# Patient Record
Sex: Male | Born: 1940
Health system: Southern US, Community
[De-identification: ages and names within clinical notes are randomized; demographics above are authoritative.]

## PROBLEM LIST (undated history)

## (undated) ENCOUNTER — Emergency Department (HOSPITAL_COMMUNITY): Payer: Medicare Other | Source: Home / Self Care

## (undated) DIAGNOSIS — R001 Bradycardia, unspecified: Secondary | ICD-10-CM

## (undated) DIAGNOSIS — I2510 Atherosclerotic heart disease of native coronary artery without angina pectoris: Secondary | ICD-10-CM

## (undated) DIAGNOSIS — I10 Essential (primary) hypertension: Secondary | ICD-10-CM

## (undated) DIAGNOSIS — E785 Hyperlipidemia, unspecified: Secondary | ICD-10-CM

## (undated) DIAGNOSIS — I455 Other specified heart block: Secondary | ICD-10-CM

## (undated) DIAGNOSIS — H269 Unspecified cataract: Secondary | ICD-10-CM

## (undated) DIAGNOSIS — Z720 Tobacco use: Secondary | ICD-10-CM

## (undated) HISTORY — DX: Unspecified cataract: H26.9

## (undated) HISTORY — PX: OTHER SURGICAL HISTORY: SHX169

## (undated) HISTORY — PX: APPENDECTOMY: SHX54

## (undated) HISTORY — DX: Essential (primary) hypertension: I10

## (undated) HISTORY — PX: DENTAL SURGERY: SHX609

---

## 2006-08-28 ENCOUNTER — Emergency Department (HOSPITAL_COMMUNITY): Admission: EM | Admit: 2006-08-28 | Discharge: 2006-08-28 | Payer: Self-pay | Admitting: Emergency Medicine

## 2009-01-14 ENCOUNTER — Emergency Department (HOSPITAL_COMMUNITY): Admission: EM | Admit: 2009-01-14 | Discharge: 2009-01-15 | Payer: Self-pay | Admitting: Emergency Medicine

## 2009-01-15 ENCOUNTER — Emergency Department (HOSPITAL_COMMUNITY): Admission: EM | Admit: 2009-01-15 | Discharge: 2009-01-15 | Payer: Self-pay | Admitting: Emergency Medicine

## 2009-01-17 ENCOUNTER — Inpatient Hospital Stay (HOSPITAL_COMMUNITY): Admission: EM | Admit: 2009-01-17 | Discharge: 2009-01-22 | Payer: Self-pay | Admitting: Oral Surgery

## 2009-01-17 ENCOUNTER — Other Ambulatory Visit: Payer: Self-pay | Admitting: Oral Surgery

## 2009-03-12 ENCOUNTER — Encounter: Admission: RE | Admit: 2009-03-12 | Discharge: 2009-03-12 | Payer: Self-pay | Admitting: Family Medicine

## 2010-05-10 ENCOUNTER — Encounter
Admission: RE | Admit: 2010-05-10 | Discharge: 2010-05-10 | Payer: Self-pay | Source: Home / Self Care | Attending: Family Medicine | Admitting: Family Medicine

## 2010-08-23 LAB — URINALYSIS, ROUTINE W REFLEX MICROSCOPIC
Glucose, UA: NEGATIVE mg/dL
Hgb urine dipstick: NEGATIVE
Ketones, ur: 15 mg/dL — AB
Specific Gravity, Urine: 1.026 (ref 1.005–1.030)
Urobilinogen, UA: 1 mg/dL (ref 0.0–1.0)
pH: 5.5 (ref 5.0–8.0)

## 2010-08-23 LAB — CBC
HCT: 36.4 % — ABNORMAL LOW (ref 39.0–52.0)
MCV: 89.3 fL (ref 78.0–100.0)
Platelets: 278 10*3/uL (ref 150–400)
Platelets: 300 10*3/uL (ref 150–400)
RBC: 4.03 MIL/uL — ABNORMAL LOW (ref 4.22–5.81)
RDW: 12.8 % (ref 11.5–15.5)
RDW: 13.6 % (ref 11.5–15.5)
WBC: 7.2 10*3/uL (ref 4.0–10.5)

## 2010-08-23 LAB — COMPREHENSIVE METABOLIC PANEL
Albumin: 3.2 g/dL — ABNORMAL LOW (ref 3.5–5.2)
BUN: 10 mg/dL (ref 6–23)
CO2: 25 mEq/L (ref 19–32)
Calcium: 8.9 mg/dL (ref 8.4–10.5)
Chloride: 98 mEq/L (ref 96–112)
GFR calc Af Amer: >60 mL/min (ref >60)
Glucose, Bld: 127 mg/dL — ABNORMAL HIGH (ref 70–99)

## 2010-08-23 LAB — URINE MICROSCOPIC-ADD ON

## 2010-08-23 LAB — LIPID PANEL
Cholesterol: 133 mg/dL (ref 0–200)
HDL: 47 mg/dL (ref >39)
Total CHOL/HDL Ratio: 2.8 RATIO
Triglycerides: 56 mg/dL (ref <150)
VLDL: 11 mg/dL (ref 0–40)

## 2010-10-01 NOTE — Consult Note (Signed)
Todd Ferguson, Todd Ferguson             ACCOUNT NO.:  [id number]   MEDICAL RECORD NO.:  [id number]          PATIENT TYPE:  INP   LOCATION:  5120                         FACILITY:  [facility]   PHYSICIAN:  Conley Canal, MD      DATE OF BIRTH:  09-16-1940   DATE OF CONSULTATION:  01/17/2009  DATE OF DISCHARGE:                                 CONSULTATION   REASON FOR CONSULTATION:  Management of hypertension.   I was asked by Dr. Barbette Merino to see this pleasant 70 year old African  American male who professes no previous past medical history, who was  brought in for left mandibular abscess drainage performed today by Dr.  Barbette Merino.  The consult was requested because the patient was noted to have  elevated blood pressure ranging from 148 to 184 systolic during this  hospitalization, hence reason for consult.  The patient states that he  has not had any problems with blood pressure although he does not follow  with any family care Todd Ferguson and he says that he has otherwise been in  good health, denies any symptomatology related to hypertension.  There  is no history of headaches, no chest pain, no genitourinary symptoms.  He states that he does not check his blood pressure at home and it has  been a while since he last saw a primary care Todd Ferguson.   REVIEW OF SYSTEMS:  Unremarkable except as highlighted in the history of  present illness.   PAST MEDICAL HISTORY:  Negative.   PAST SURGICAL HISTORY:  History of appendectomy.  History of left  mandibular abscess drainage.   ALLERGIES:  No known drug allergies.   FAMILY HISTORY:  Strong family history of hypertension including both  his parents.  There is also a history of heart disease in his mother who  is diabetic.   SOCIAL HISTORY:  The patient works in a Producer, television/film/video.  He has a  fiancee.  He smokes half a pack of cigars per day he says for the last  10 years.  There is no history of alcohol, no illicit drugs.   HOME MEDICATIONS:  None.   INPATIENT MEDICATIONSN:  1. D5W NS  2. Clindamycin.  3. Percocet.  4. Zofran as needed.   PHYSICAL EXAMINATION:  GENERAL:  This is an elderly [place] American  male who is otherwise not in acute distress.  He is sitting up in bed.  VITAL SIGNS:  Blood pressure 184/72, heart rate 104, temperature 102.2,  respiratory rate 16.  HEENT:  Pupils equal reacting to light.  He has an obvious left  mandibular swelling with no drainage.  RESPIRATORY:  There is good air entry bilaterally with no rhonchi or  wheezes.  CARDIOVASCULAR:  First and second heart sounds, no murmurs, pulses.  Regular, tachycardic.  ABDOMEN:  Scaphoid, soft, nontender, no palpable organomegaly.  Bowel  sounds are normal.  CNS:  The patient is alert, oriented to person, place and time with no  acute focal neurological deficits.  EXTREMITIES:  There is no pedal edema.  Peripheral pulses are equal.   LABORATORY DATA:  Significant for WBC 12,000,  hemoglobin 13.8,  hematocrit 40.8, platelet count 278.  Sodium 135, potassium 3.8, BUN 10,  creatinine 0.96, glucose 127.  Liver function tests within normal  limits.   IMPRESSION:  A 70 year old [place] American male who is presenting with  newly diagnosed hypertension obviously poorly controlled, who is  admitted for left mandibular drainage which has been successfully  performed today.   RECOMMENDATIONS:  1. Uncontrolled hypertension.  Given patient's strong family history      and his infrequent visits to primary care providers, he probably      has had hypertension undiagnosed for quite a while now although he      has been essentially asymptomatic consistent with essential      hypertension.  There might be an element of pain contributing to      the hypertension.  At this stage would recommend to start an      antihypertensive and monitor the blood pressure during the course      of the hospitalization.  We will start the patient on calcium      channel blocker and  obtain baseline labs including urinalysis, EKG.      Will also obtain thyroid function tests, lipids panel.  The patient      was encouraged to follow with primary care for health care      maintenance including especially his high blood pressure.  2. Left mandibular abscess.  Being managed by Dr. Barbette Merino.  Would      continue with current management including gentle rehydration and      IV antibiotics.  3. Tobacco  habituation.  Strong smoking cessation counseling was      given including complications of prolonged cigar smoking.  The      patient will look into quitting.  4. Deep venous thrombosis prophylaxis.  5. Hyperglycemia.  This might be related to stress.  Would monitor      glucose during course of hospitalization.  The patient may have      undiagnosed glucose intolerance.   Thank you for involving [place] in the care of this patient.  Will follow  with you during the course of this hospitalization.      Conley Canal, MD  Electronically Signed     SR/MEDQ  D:  01/17/2009  T:  01/18/2009  Job:  [id number]

## 2010-10-01 NOTE — H&P (Signed)
NAMELEO, FRAY             ACCOUNT NO.:  [id number]   MEDICAL RECORD NO.:  [id number]          PATIENT TYPE:  INP   LOCATION:  5120                         FACILITY:  [facility]   PHYSICIAN:  Georgia Lopes, M.D.  DATE OF BIRTH:  [birth year]   DATE OF ADMISSION:  01/17/2009  DATE OF DISCHARGE:                              HISTORY & PHYSICAL   ADMITTING DIAGNOSIS:  Left mandibular cellulitis.   CHIEF COMPLAINT:  Swelling of left jaw.   HISTORY OF PRESENT ILLNESS:  Mr. Forehand is a 70 year old black male who  was seen at [facility] Emergency Room on August 30 for complaint of  swelling of the left jaw.  He was evaluated and given prescriptions for  penicillin and Percocet and referred to my office for evaluation.  He  was seen the following day and was scheduled for surgery on January 18, 2009.  The patient called on January 16, 2009, complaining of increased  swelling.  He was seen in the office on that date and scheduled for  surgery today.  Using intravenous sedation and local anesthesia,  incision and drainage was performed both extraorally and intraorally and  Penrose drains were placed in the left mandible.  Teeth numbers #17 and  #18 were removed.  Purulence was obtained.  The patient tolerated the  procedure well, but postoperatively began having chills and did not feel  like himself and presented to the emergency room at [facility].   ALLERGIES:  None.   PAST MEDICAL HISTORY:  The patient denies; however, the patient was seen  in [facility] ER on August 30th complaining of high blood pressure and was  told to see a primary care physician.   MEDICATIONS:  Penicillin, Percocet.   PHYSICAL EXAMINATION:  GENERAL:  A well-nourished, well-developed 70-  year-old black male with obvious left facial swelling and dressing in  place.  VITALS:  Temperature of 102.2, BP 184/72, pulse 104, respirations 24.  Upon admission after intravenous fluids and pain medication, BP  decreased to 142/74.  HEENT:  Head normocephalic, atraumatic.  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  Extraocular motions intact.  Recent extraction  area of tooth #17 and #18 Penrose drain present, buccal vestibule  adjacent to #17 there is some ecchymosis and edema of the left soft  palate, but no midline shift.  Maximum opening is below normal at  approximately 20 mm.  NECK:  Two Penrose drains dressed on the left submandibular area.  HEART:  Regular rate and rhythm.  LUNGS:  Clear.  ABDOMEN:  Positive bowel sounds.  Soft, nontender.  EXTREMITIES:  Without cyanosis.  Full range of motion present.  NEUROLOGIC:  Alert and oriented x3.  Cranial nerves intact.   IMPRESSION:  A 70 year old black male with left mandibular cellulitis  status post incision and drainage and removal of teeth and worked up  hypertension.   PLAN:  Admit.  Intravenous antibiotics and fluids.  Dr. Venetia Constable of  Hospitalist Service is to see the patient for hypertension.      Georgia Lopes, M.D.  Electronically Signed  SMJ/MEDQ  D:  01/17/2009  T:  01/18/2009  Job:  [id number]

## 2011-06-03 DIAGNOSIS — N429 Disorder of prostate, unspecified: Secondary | ICD-10-CM | POA: Diagnosis not present

## 2011-06-03 DIAGNOSIS — E785 Hyperlipidemia, unspecified: Secondary | ICD-10-CM | POA: Diagnosis not present

## 2011-06-03 DIAGNOSIS — I10 Essential (primary) hypertension: Secondary | ICD-10-CM | POA: Diagnosis not present

## 2011-07-30 DIAGNOSIS — I10 Essential (primary) hypertension: Secondary | ICD-10-CM | POA: Diagnosis not present

## 2011-07-30 DIAGNOSIS — E785 Hyperlipidemia, unspecified: Secondary | ICD-10-CM | POA: Diagnosis not present

## 2013-04-29 ENCOUNTER — Ambulatory Visit: Payer: Medicare Other | Attending: Internal Medicine | Admitting: Internal Medicine

## 2013-04-29 ENCOUNTER — Telehealth: Payer: Self-pay | Admitting: Internal Medicine

## 2013-04-29 ENCOUNTER — Encounter: Payer: Self-pay | Admitting: Internal Medicine

## 2013-04-29 VITALS — BP 201/98 | HR 71 | Temp 98.8°F | Resp 14 | Ht 64.0 in | Wt 205.6 lb

## 2013-04-29 DIAGNOSIS — I10 Essential (primary) hypertension: Secondary | ICD-10-CM | POA: Diagnosis not present

## 2013-04-29 DIAGNOSIS — Z23 Encounter for immunization: Secondary | ICD-10-CM | POA: Diagnosis not present

## 2013-04-29 LAB — COMPLETE METABOLIC PANEL WITH GFR
ALT: 11 U/L (ref 0–53)
AST: 13 U/L (ref 0–37)
Albumin: 4.4 g/dL (ref 3.5–5.2)
Alkaline Phosphatase: 52 U/L (ref 39–117)
CO2: 28 mEq/L (ref 19–32)
Calcium: 9.6 mg/dL (ref 8.4–10.5)
Chloride: 101 mEq/L (ref 96–112)
Creat: 0.99 mg/dL (ref 0.50–1.35)
Glucose, Bld: 83 mg/dL (ref 70–99)
Total Bilirubin: 0.3 mg/dL (ref 0.3–1.2)

## 2013-04-29 LAB — LIPID PANEL
Cholesterol: 203 mg/dL — ABNORMAL HIGH (ref 0–200)
HDL: 55 mg/dL (ref >39)
Total CHOL/HDL Ratio: 3.7 Ratio

## 2013-04-29 LAB — CBC WITH DIFFERENTIAL/PLATELET
Basophils Relative: 0 % (ref 0–1)
Eosinophils Relative: 3 % (ref 0–5)
HCT: 45.1 % (ref 39.0–52.0)
Lymphs Abs: 2.8 10*3/uL (ref 0.7–4.0)
Monocytes Relative: 7 % (ref 3–12)
Neutrophils Relative %: 43 % (ref 43–77)
Platelets: 259 10*3/uL (ref 150–400)
WBC: 5.9 10*3/uL (ref 4.0–10.5)

## 2013-04-29 MED ORDER — HYDROCHLOROTHIAZIDE 25 MG PO TABS: 25.0000 mg | ORAL_TABLET | Freq: Every day | ORAL | Status: DC

## 2013-04-29 MED ORDER — LISINOPRIL 40 MG PO TABS: 40.0000 mg | ORAL_TABLET | Freq: Every day | ORAL | Status: DC

## 2013-04-29 MED ORDER — AMLODIPINE BESY-BENAZEPRIL HCL 10-20 MG PO CAPS: 1.0000 | ORAL_CAPSULE | Freq: Every day | ORAL | Status: DC

## 2013-04-29 MED ORDER — CLONIDINE HCL 0.1 MG PO TABS: 0.2000 mg | ORAL_TABLET | Freq: Once | ORAL | Status: DC

## 2013-04-29 NOTE — Progress Notes (Signed)
Pt is here to establish care and for hypertension.

## 2013-04-29 NOTE — Telephone Encounter (Signed)
[facility] pharmacy calling to verify quantity and directions for cloNIDine (CATAPRES) 0.1 MG tablet script e-scribed on 04/29/13. Please f/u with pharmacy.

## 2013-04-29 NOTE — Progress Notes (Signed)
Patient ID: Todd Ferguson, male   DOB: 11/27/1940, 72 y.o.   MRN: [id number]   CC:  HPI:  72 year old African American male with a past medical history of hypertension, who presents today to establish care, her systolic blood pressure is 201. He denies any chest pain any shortness of breath. He denies any dependent edema He is on multiple medications for his blood pressure has not taken them for about 6 months. He states that his previous primary care provider was unavailable. He also states that he was on a medication for cholesterol but is unable to recall what he was on He denies any previous history of cardiac workup.  He smokes cigarettes one pack over 2 days. Also admits to smoking marijuana, occasional alcohol  FAMILY HISTORY: Strong family history of hypertension including both  his parents. There is also a history of heart disease in his mother who  is diabetic.    Not on File Past Medical History  Diagnosis Date  . Hypertension    No current outpatient prescriptions on file prior to visit.   No current facility-administered medications on file prior to visit.   Family History  Problem Relation Age of Onset  . Diabetes Brother    History   Social History  . Marital Status: Legally Separated    Spouse Name: N/A    Number of Children: N/A  . Years of Education: N/A   Occupational History  . Not on file.   Social History Main Topics  . Smoking status: Current Every Day Smoker    Types: Cigarettes  . Smokeless tobacco: Not on file  . Alcohol Use: No  . Drug Use: No  . Sexual Activity: Not on file   Other Topics Concern  . Not on file   Social History Narrative  . No narrative on file    Review of Systems  Constitutional: Negative for fever, chills, diaphoresis, activity change, appetite change and fatigue.  HENT: Negative for ear pain, nosebleeds, congestion, facial swelling, rhinorrhea, neck pain, neck stiffness and ear discharge.   Eyes: Negative  for pain, discharge, redness, itching and visual disturbance.  Respiratory: Negative for cough, choking, chest tightness, shortness of breath, wheezing and stridor.   Cardiovascular: Negative for chest pain, palpitations and leg swelling.  Gastrointestinal: Negative for abdominal distention.  Genitourinary: Negative for dysuria, urgency, frequency, hematuria, flank pain, decreased urine volume, difficulty urinating and dyspareunia.  Musculoskeletal: Negative for back pain, joint swelling, arthralgias and gait problem.  Neurological: Negative for dizziness, tremors, seizures, syncope, facial asymmetry, speech difficulty, weakness, light-headedness, numbness and headaches.  Hematological: Negative for adenopathy. Does not bruise/bleed easily.  Psychiatric/Behavioral: Negative for hallucinations, behavioral problems, confusion, dysphoric mood, decreased concentration and agitation.    Objective:   Filed Vitals:   04/29/13 1044  BP: 201/98  Pulse: 71  Temp: 98.8 F (37.1 C)  Resp: 14    Physical Exam  Constitutional: Appears well-developed and well-nourished. No distress.  HENT: Normocephalic. External right and left ear normal. Oropharynx is clear and moist.  Eyes: Conjunctivae and EOM are normal. PERRLA, no scleral icterus.  Neck: Normal ROM. Neck supple. No JVD. No tracheal deviation. No thyromegaly.  CVS: RRR, S1/S2 +, no murmurs, no gallops, no carotid bruit.  Pulmonary: Effort and breath sounds normal, no stridor, rhonchi, wheezes, rales.  Abdominal: Soft. BS +,  no distension, tenderness, rebound or guarding.  Musculoskeletal: Normal range of motion. No edema and no tenderness.  Lymphadenopathy: No lymphadenopathy noted, cervical, inguinal. Neuro: Alert.  Normal reflexes, muscle tone coordination. No cranial nerve deficit. Skin: Skin is warm and dry. No rash noted. Not diaphoretic. No erythema. No pallor.  Psychiatric: Normal mood and affect. Behavior, judgment, thought content  normal.   Lab Results  Component Value Date   WBC 7.2 01/19/2009   HGB 12.4* 01/19/2009   HCT 36.4* 01/19/2009   MCV 90.3 01/19/2009   PLT 300 01/19/2009   Lab Results  Component Value Date   CREATININE 0.96 01/17/2009   BUN 10 01/17/2009   NA 135 01/17/2009   K 3.8 01/17/2009   CL 98 01/17/2009   CO2 25 01/17/2009    No results found for this basename: HGBA1C   Lipid Panel     Component Value Date/Time   CHOL  Value: 133        ATP III CLASSIFICATION:  <200     mg/dL   Desirable  295-284  mg/dL   Borderline High  >=132    mg/dL   High        08/20/100 0410   TRIG 56 01/18/2009 0410   HDL 47 01/18/2009 0410   CHOLHDL 2.8 01/18/2009 0410   VLDL 11 01/18/2009 0410   LDLCALC  Value: 75        Total Cholesterol/HDL:CHD Risk Coronary Heart Disease Risk Table                     Men   Women  1/2 Average Risk   3.4   3.3  Average Risk       5.0   4.4  2 X Average Risk   9.6   7.1  3 X Average Risk  23.4   11.0        Use the calculated Patient Ratio above and the CHD Risk Table to determine the patient's CHD Risk.        ATP III CLASSIFICATION (LDL):  <100     mg/dL   Optimal  725-366  mg/dL   Near or Above                    Optimal  130-159  mg/dL   Borderline  440-347  mg/dL   High  >425     mg/dL   Very High 01/21/6386 5643       Assessment and plan:   There are no active problems to display for this patient.      Hypertension, malignant, asymptomatic We'll give 1 dose of clonidine 0.2 mg and observed for half an hour Prescribed his antihypertensive medications He needs close outpatient followup Will have him come back in one week for a blood pressure check and review his labs Obtain baseline labs including cholesterol, A1c, CBC, CMP Colonoscopy referral  Follow up in one week   The patient was given clear instructions to go to ER or return to medical center if symptoms don't improve, worsen or new problems develop. The patient verbalized understanding. The patient was told to call to get any lab  results if not heard anything in the next week.

## 2013-04-30 LAB — TSH: TSH: 1.098 u[IU]/mL (ref 0.350–4.500)

## 2013-04-30 LAB — VITAMIN D 25 HYDROXY (VIT D DEFICIENCY, FRACTURES): Vit D, 25-Hydroxy: 10 ng/mL — ABNORMAL LOW (ref 30–89)

## 2013-05-03 ENCOUNTER — Telehealth: Payer: Self-pay | Admitting: *Deleted

## 2013-05-03 NOTE — Telephone Encounter (Signed)
Contacted pt to inform him of his lab results. Received no answer and left a voicemail for him to give [place] a call back.

## 2013-05-09 ENCOUNTER — Ambulatory Visit: Payer: Self-pay

## 2013-05-10 ENCOUNTER — Ambulatory Visit: Payer: Medicare Other | Attending: Internal Medicine | Admitting: Internal Medicine

## 2013-05-10 VITALS — BP 143/81 | HR 88 | Temp 98.3°F | Resp 16 | Ht 64.0 in | Wt 202.0 lb

## 2013-05-10 DIAGNOSIS — E781 Pure hyperglyceridemia: Secondary | ICD-10-CM | POA: Diagnosis not present

## 2013-05-10 DIAGNOSIS — I10 Essential (primary) hypertension: Secondary | ICD-10-CM | POA: Diagnosis not present

## 2013-05-10 MED ORDER — VITAMIN D (ERGOCALCIFEROL) 1.25 MG (50000 UNIT) PO CAPS: 50000.0000 [IU] | ORAL_CAPSULE | ORAL | Status: DC

## 2013-05-10 MED ORDER — ASPIRIN EC 81 MG PO TBEC: 81.0000 mg | DELAYED_RELEASE_TABLET | Freq: Every day | ORAL | Status: DC

## 2013-05-10 MED ORDER — GEMFIBROZIL 600 MG PO TABS: 600.0000 mg | ORAL_TABLET | Freq: Two times a day (BID) | ORAL | Status: DC

## 2013-05-10 MED ORDER — AMLODIPINE BESYLATE 10 MG PO TABS: 10.0000 mg | ORAL_TABLET | Freq: Every day | ORAL | Status: DC

## 2013-05-10 NOTE — Progress Notes (Signed)
Pt is here following up on his HTN. 

## 2013-05-10 NOTE — Progress Notes (Signed)
Patient ID: Todd Ferguson, male   DOB: 1940-06-16, 72 y.o.   MRN: [id number]   CC:  HPI: Patient here for a blood pressure check Blood pressure improved Discussed results of low vitamin D and elevated triglycerides   No Known Allergies Past Medical History  Diagnosis Date  . Hypertension    Current Outpatient Prescriptions on File Prior to Visit  Medication Sig Dispense Refill  . hydrochlorothiazide (HYDRODIURIL) 25 MG tablet Take 1 tablet (25 mg total) by mouth daily.  60 tablet  2  . lisinopril (PRINIVIL,ZESTRIL) 40 MG tablet Take 1 tablet (40 mg total) by mouth daily.  30 tablet  2   No current facility-administered medications on file prior to visit.   Family History  Problem Relation Age of Onset  . Diabetes Brother    History   Social History  . Marital Status: Legally Separated    Spouse Name: N/A    Number of Children: N/A  . Years of Education: N/A   Occupational History  . Not on file.   Social History Main Topics  . Smoking status: Current Every Day Smoker    Types: Cigarettes  . Smokeless tobacco: Not on file  . Alcohol Use: No  . Drug Use: No  . Sexual Activity: Not on file   Other Topics Concern  . Not on file   Social History Narrative  . No narrative on file    Review of Systems  Constitutional: Negative for fever, chills, diaphoresis, activity change, appetite change and fatigue.  HENT: Negative for ear pain, nosebleeds, congestion, facial swelling, rhinorrhea, neck pain, neck stiffness and ear discharge.   Eyes: Negative for pain, discharge, redness, itching and visual disturbance.  Respiratory: Negative for cough, choking, chest tightness, shortness of breath, wheezing and stridor.   Cardiovascular: Negative for chest pain, palpitations and leg swelling.  Gastrointestinal: Negative for abdominal distention.  Genitourinary: Negative for dysuria, urgency, frequency, hematuria, flank pain, decreased urine volume, difficulty urinating and  dyspareunia.  Musculoskeletal: Negative for back pain, joint swelling, arthralgias and gait problem.  Neurological: Negative for dizziness, tremors, seizures, syncope, facial asymmetry, speech difficulty, weakness, light-headedness, numbness and headaches.  Hematological: Negative for adenopathy. Does not bruise/bleed easily.  Psychiatric/Behavioral: Negative for hallucinations, behavioral problems, confusion, dysphoric mood, decreased concentration and agitation.    Objective:   Filed Vitals:   05/10/13 1440  BP: 143/81  Pulse: 88  Temp: 98.3 F (36.8 C)  Resp: 16    Physical Exam  Constitutional: Appears well-developed and well-nourished. No distress.  HENT: Normocephalic. External right and left ear normal. Oropharynx is clear and moist.  Eyes: Conjunctivae and EOM are normal. PERRLA, no scleral icterus.  Neck: Normal ROM. Neck supple. No JVD. No tracheal deviation. No thyromegaly.  CVS: RRR, S1/S2 +, no murmurs, no gallops, no carotid bruit.  Pulmonary: Effort and breath sounds normal, no stridor, rhonchi, wheezes, rales.  Abdominal: Soft. BS +,  no distension, tenderness, rebound or guarding.  Musculoskeletal: Normal range of motion. No edema and no tenderness.  Lymphadenopathy: No lymphadenopathy noted, cervical, inguinal. Neuro: Alert. Normal reflexes, muscle tone coordination. No cranial nerve deficit. Skin: Skin is warm and dry. No rash noted. Not diaphoretic. No erythema. No pallor.  Psychiatric: Normal mood and affect. Behavior, judgment, thought content normal.   Lab Results  Component Value Date   WBC 5.9 04/29/2013   HGB 15.7 04/29/2013   HCT 45.1 04/29/2013   MCV 86.4 04/29/2013   PLT 259 04/29/2013   Lab Results  Component Value Date   CREATININE 0.99 04/29/2013   BUN 7 04/29/2013   NA 137 04/29/2013   K 4.9 04/29/2013   CL 101 04/29/2013   CO2 28 04/29/2013    No results found for this basename: HGBA1C   Lipid Panel     Component Value Date/Time    CHOL 203* 04/29/2013 1214   TRIG 183* 04/29/2013 1214   HDL 55 04/29/2013 1214   CHOLHDL 3.7 04/29/2013 1214   VLDL 37 04/29/2013 1214   LDLCALC 111* 04/29/2013 1214       Assessment and plan:   There are no active problems to display for this patient.      Hypertriglyceridemia Start the patient on Lopid  Hypertension Patient continue lisinopril/HCTZ Norvasc 10 mg a day  Vitamin D deficiency Patient has been called in a prescription for vitamin D 50,000 units weekly  Follow up in 2 months  The patient was given clear instructions to go to ER or return to medical center if symptoms don't improve, worsen or new problems develop. The patient verbalized understanding. The patient was told to call to get any lab results if not heard anything in the next week.

## 2013-07-11 ENCOUNTER — Ambulatory Visit: Payer: Medicare Other | Attending: Internal Medicine | Admitting: Internal Medicine

## 2013-07-11 ENCOUNTER — Encounter: Payer: Self-pay | Admitting: Internal Medicine

## 2013-07-11 VITALS — BP 150/74 | HR 70 | Temp 98.2°F | Resp 16 | Wt 204.2 lb

## 2013-07-11 DIAGNOSIS — F17200 Nicotine dependence, unspecified, uncomplicated: Secondary | ICD-10-CM

## 2013-07-11 DIAGNOSIS — Z09 Encounter for follow-up examination after completed treatment for conditions other than malignant neoplasm: Secondary | ICD-10-CM | POA: Diagnosis not present

## 2013-07-11 DIAGNOSIS — I10 Essential (primary) hypertension: Secondary | ICD-10-CM | POA: Diagnosis not present

## 2013-07-11 DIAGNOSIS — E781 Pure hyperglyceridemia: Secondary | ICD-10-CM | POA: Insufficient documentation

## 2013-07-11 DIAGNOSIS — E785 Hyperlipidemia, unspecified: Secondary | ICD-10-CM | POA: Insufficient documentation

## 2013-07-11 DIAGNOSIS — E559 Vitamin D deficiency, unspecified: Secondary | ICD-10-CM | POA: Diagnosis not present

## 2013-07-11 MED ORDER — NICOTINE 21 MG/24HR TD PT24: 21.0000 mg | MEDICATED_PATCH | Freq: Every day | TRANSDERMAL | Status: DC

## 2013-07-11 NOTE — Patient Instructions (Signed)
DASH Diet  The DASH diet stands for "Dietary Approaches to Stop Hypertension." It is a healthy eating plan that has been shown to reduce high blood pressure (hypertension) in as little as 14 days, while also possibly providing other significant health benefits. These other health benefits include reducing the risk of breast cancer after menopause and reducing the risk of type 2 diabetes, heart disease, colon cancer, and stroke. Health benefits also include weight loss and slowing kidney failure in patients with chronic kidney disease.   DIET GUIDELINES  · Limit salt (sodium). Your diet should contain less than 1500 mg of sodium daily.  · Limit refined or processed carbohydrates. Your diet should include mostly whole grains. Desserts and added sugars should be used sparingly.  · Include small amounts of heart-healthy fats. These types of fats include nuts, oils, and tub margarine. Limit saturated and trans fats. These fats have been shown to be harmful in the body.  CHOOSING FOODS   The following food groups are based on a 2000 calorie diet. See your Registered Dietitian for individual calorie needs.  Grains and Grain Products (6 to 8 servings daily)  · Eat More Often: Whole-wheat bread, brown rice, whole-grain or wheat pasta, quinoa, popcorn without added fat or salt (air popped).  · Eat Less Often: White bread, white pasta, white rice, cornbread.  Vegetables (4 to 5 servings daily)  · Eat More Often: Fresh, frozen, and canned vegetables. Vegetables may be raw, steamed, roasted, or grilled with a minimal amount of fat.  · Eat Less Often/Avoid: Creamed or fried vegetables. Vegetables in a cheese sauce.  Fruit (4 to 5 servings daily)  · Eat More Often: All fresh, canned (in natural juice), or frozen fruits. Dried fruits without added sugar. One hundred percent fruit juice (½ cup [237 mL] daily).  · Eat Less Often: Dried fruits with added sugar. Canned fruit in light or heavy syrup.  Lean Meats, Fish, and Poultry (2  servings or less daily. One serving is 3 to 4 oz [85-114 g]).  · Eat More Often: Ninety percent or leaner ground beef, tenderloin, sirloin. Round cuts of beef, chicken breast, turkey breast. All fish. Grill, bake, or broil your meat. Nothing should be fried.  · Eat Less Often/Avoid: Fatty cuts of meat, turkey, or chicken leg, thigh, or wing. Fried cuts of meat or fish.  Dairy (2 to 3 servings)  · Eat More Often: Low-fat or fat-free milk, low-fat plain or light yogurt, reduced-fat or part-skim cheese.  · Eat Less Often/Avoid: Milk (whole, 2%). Whole milk yogurt. Full-fat cheeses.  Nuts, Seeds, and Legumes (4 to 5 servings per week)  · Eat More Often: All without added salt.  · Eat Less Often/Avoid: Salted nuts and seeds, canned beans with added salt.  Fats and Sweets (limited)  · Eat More Often: Vegetable oils, tub margarines without trans fats, sugar-free gelatin. Mayonnaise and salad dressings.  · Eat Less Often/Avoid: Coconut oils, palm oils, butter, stick margarine, cream, half and half, cookies, candy, pie.  FOR MORE INFORMATION  The Dash Diet Eating Plan: www.dashdiet.org  Document Released: 04/24/2011 Document Revised: 07/28/2011 Document Reviewed: 04/24/2011  [employer]® Patient Information ©2014 [employer].

## 2013-07-11 NOTE — Progress Notes (Signed)
Patient here for follow up _HTN 

## 2013-07-11 NOTE — Progress Notes (Signed)
MRN: [id number] Name: Todd Ferguson  Sex: male Age: 73 y.o. DOB: 1941-03-05  Allergies: Review of patient's allergies indicates no known allergies.  Chief Complaint  Patient presents with  . Follow-up    HPI: Patient is 73 y.o. male who has to of hypertension hyperlipidemia, vitamin D deficiency comes today for followup, today's blood pressure is borderline high denies any headache dizziness chest and shortness of breath, is currently taking Norvasc, HCTZ and lisinopril, patient also smokes cigarettes, advised to quit smoking he is going to try nicotine patch.  Past Medical History  Diagnosis Date  . Hypertension     History reviewed. No pertinent past surgical history.    Medication List       This list is accurate as of: 07/11/13  2:37 PM.  Always use your most recent med list.               amLODipine 10 MG tablet  Commonly known as:  NORVASC  Take 1 tablet (10 mg total) by mouth daily.     aspirin EC 81 MG tablet  Take 1 tablet (81 mg total) by mouth daily.     gemfibrozil 600 MG tablet  Commonly known as:  LOPID  Take 1 tablet (600 mg total) by mouth 2 (two) times daily before a meal.     hydrochlorothiazide 25 MG tablet  Commonly known as:  HYDRODIURIL  Take 1 tablet (25 mg total) by mouth daily.     lisinopril 40 MG tablet  Commonly known as:  PRINIVIL,ZESTRIL  Take 1 tablet (40 mg total) by mouth daily.     nicotine 21 mg/24hr patch  Commonly known as:  NICODERM CQ  Place 1 patch (21 mg total) onto the skin daily.     Vitamin D (Ergocalciferol) 50000 UNITS Caps capsule  Commonly known as:  DRISDOL  Take 1 capsule (50,000 Units total) by mouth every 7 (seven) days.        Meds ordered this encounter  Medications  . nicotine (NICODERM CQ) 21 mg/24hr patch    Sig: Place 1 patch (21 mg total) onto the skin daily.    Dispense:  28 patch    Refill:  0    Immunization History  Administered Date(s) Administered  . Influenza,inj,Quad  PF,36+ Mos 04/29/2013    Family History  Problem Relation Age of Onset  . Diabetes Brother     History  Substance Use Topics  . Smoking status: Current Every Day Smoker    Types: Cigarettes  . Smokeless tobacco: Not on file  . Alcohol Use: No    Review of Systems   As noted in HPI  Filed Vitals:   07/11/13 1427  BP: 150/74  Pulse: 70  Temp: 98.2 F (36.8 C)  Resp: 16    Physical Exam  Physical Exam  Constitutional: No distress.  Neck: Neck supple.  Cardiovascular: Normal rate and regular rhythm.   Pulmonary/Chest: Breath sounds normal. He has no wheezes. He has no rales.  Musculoskeletal: He exhibits no edema.    CBC    Component Value Date/Time   WBC 5.9 04/29/2013 1214   RBC 5.22 04/29/2013 1214   HGB 15.7 04/29/2013 1214   HCT 45.1 04/29/2013 1214   PLT 259 04/29/2013 1214   MCV 86.4 04/29/2013 1214   LYMPHSABS 2.8 04/29/2013 1214   MONOABS 0.4 04/29/2013 1214   EOSABS 0.2 04/29/2013 1214   BASOSABS 0.0 04/29/2013 1214    CMP  Component Value Date/Time   NA 137 04/29/2013 1214   K 4.9 04/29/2013 1214   CL 101 04/29/2013 1214   CO2 28 04/29/2013 1214   GLUCOSE 83 04/29/2013 1214   BUN 7 04/29/2013 1214   CREATININE 0.99 04/29/2013 1214   CREATININE 0.96 01/17/2009 1806   CALCIUM 9.6 04/29/2013 1214   PROT 7.7 04/29/2013 1214   ALBUMIN 4.4 04/29/2013 1214   AST 13 04/29/2013 1214   ALT 11 04/29/2013 1214   ALKPHOS 52 04/29/2013 1214   BILITOT 0.3 04/29/2013 1214   GFRNONAA >60 01/17/2009 1806   GFRAA  Value: >60        The eGFR has been calculated using the MDRD equation. This calculation has not been validated in all clinical situations. eGFR's persistently <60 mL/min signify possible Chronic Kidney Disease. 01/17/2009 1806    Lab Results  Component Value Date/Time   CHOL 203* 04/29/2013 12:14 PM    No components found with this basename: hga1c    Lab Results  Component Value Date/Time   AST 13 04/29/2013 12:14 PM    Assessment  and Plan  Essential hypertension, benign - Plan: I have advised patient for DASH DIET WILL CONTINUE WITH AMLODIPINE 10 MG AND LISINOPRIL 40 MG AND HYDROCHLOROTHIAZIDE 25 MG DAILY COMPLETE METABOLIC PANEL WITH GFR  Hypertriglyceridemia - Plan: Continue with Lopid  will repeat fasting Lipid panel  Unspecified vitamin D deficiency Continue with vitamin D supplement  Smoking - Plan: nicotine (NICODERM CQ) 21 mg/24hr patch    Return in about 3 months (around 10/08/2013) for hypertension.  Lorayne Marek, MD

## 2013-07-28 ENCOUNTER — Ambulatory Visit: Payer: Medicare Other | Admitting: Internal Medicine

## 2013-09-06 ENCOUNTER — Other Ambulatory Visit: Payer: Self-pay | Admitting: Internal Medicine

## 2013-09-06 DIAGNOSIS — I10 Essential (primary) hypertension: Secondary | ICD-10-CM

## 2013-09-06 MED ORDER — LISINOPRIL 40 MG PO TABS: 40.0000 mg | ORAL_TABLET | Freq: Every day | ORAL | Status: DC

## 2013-10-11 ENCOUNTER — Ambulatory Visit: Payer: Medicare Other | Admitting: Internal Medicine

## 2013-10-21 ENCOUNTER — Ambulatory Visit: Payer: Medicare Other | Attending: Internal Medicine | Admitting: Internal Medicine

## 2013-10-21 ENCOUNTER — Encounter: Payer: Self-pay | Admitting: Internal Medicine

## 2013-10-21 VITALS — BP 181/82 | HR 85 | Temp 98.4°F | Resp 20 | Ht 64.0 in | Wt 200.0 lb

## 2013-10-21 DIAGNOSIS — F17200 Nicotine dependence, unspecified, uncomplicated: Secondary | ICD-10-CM

## 2013-10-21 DIAGNOSIS — Z7982 Long term (current) use of aspirin: Secondary | ICD-10-CM | POA: Diagnosis not present

## 2013-10-21 DIAGNOSIS — E781 Pure hyperglyceridemia: Secondary | ICD-10-CM | POA: Diagnosis not present

## 2013-10-21 DIAGNOSIS — I10 Essential (primary) hypertension: Secondary | ICD-10-CM | POA: Diagnosis not present

## 2013-10-21 MED ORDER — LISINOPRIL 40 MG PO TABS: 40.0000 mg | ORAL_TABLET | Freq: Every day | ORAL | Status: DC

## 2013-10-21 MED ORDER — HYDROCHLOROTHIAZIDE 25 MG PO TABS: 25.0000 mg | ORAL_TABLET | Freq: Every day | ORAL | Status: DC

## 2013-10-21 MED ORDER — GEMFIBROZIL 600 MG PO TABS: 600.0000 mg | ORAL_TABLET | Freq: Two times a day (BID) | ORAL | Status: DC

## 2013-10-21 MED ORDER — AMLODIPINE BESYLATE 10 MG PO TABS: 10.0000 mg | ORAL_TABLET | Freq: Every day | ORAL | Status: DC

## 2013-10-21 NOTE — Progress Notes (Signed)
Patient here for follow-up of hypertension. Patient needs refills of all medications and indicates he last took amlodipine a month ago. BP elevated today-182/73 in left arm, 181/82 in right arm. Clonidine not needed per Chari Manning, NP. Patient asymptomatic-denies chest pain, dizziness, headache.

## 2013-10-21 NOTE — Patient Instructions (Signed)
DASH Diet The DASH diet stands for "Dietary Approaches to Stop Hypertension." It is a healthy eating plan that has been shown to reduce high blood pressure (hypertension) in as little as 14 days, while also possibly providing other significant health benefits. These other health benefits include reducing the risk of breast cancer after menopause and reducing the risk of type 2 diabetes, heart disease, colon cancer, and stroke. Health benefits also include weight loss and slowing kidney failure in patients with chronic kidney disease.  DIET GUIDELINES  Limit salt (sodium). Your diet should contain less than 1500 mg of sodium daily.  Limit refined or processed carbohydrates. Your diet should include mostly whole grains. Desserts and added sugars should be used sparingly.  Include small amounts of heart-healthy fats. These types of fats include nuts, oils, and tub margarine. Limit saturated and trans fats. These fats have been shown to be harmful in the body. CHOOSING FOODS  The following food groups are based on a 2000 calorie diet. See your Registered Dietitian for individual calorie needs. Grains and Grain Products (6 to 8 servings daily)  Eat More Often: Whole-wheat bread, brown rice, whole-grain or wheat pasta, quinoa, popcorn without added fat or salt (air popped).  Eat Less Often: White bread, white pasta, white rice, cornbread. Vegetables (4 to 5 servings daily)  Eat More Often: Fresh, frozen, and canned vegetables. Vegetables may be raw, steamed, roasted, or grilled with a minimal amount of fat.  Eat Less Often/Avoid: Creamed or fried vegetables. Vegetables in a cheese sauce. Fruit (4 to 5 servings daily)  Eat More Often: All fresh, canned (in natural juice), or frozen fruits. Dried fruits without added sugar. One hundred percent fruit juice ( cup [237 mL] daily).  Eat Less Often: Dried fruits with added sugar. Canned fruit in light or heavy syrup. [employer], Fish, and Poultry (2  servings or less daily. One serving is 3 to 4 oz [85-114 g]).  Eat More Often: Ninety percent or leaner ground beef, tenderloin, sirloin. Round cuts of beef, chicken breast, [place] breast. All fish. Grill, bake, or broil your meat. Nothing should be fried.  Eat Less Often/Avoid: Fatty cuts of meat, [place], or chicken leg, thigh, or wing. Fried cuts of meat or fish. Dairy (2 to 3 servings)  Eat More Often: Low-fat or fat-free milk, low-fat plain or light yogurt, reduced-fat or part-skim cheese.  Eat Less Often/Avoid: Milk (whole, 2%).Whole milk yogurt. Full-fat cheeses. Nuts, Seeds, and Legumes (4 to 5 servings per week)  Eat More Often: All without added salt.  Eat Less Often/Avoid: Salted nuts and seeds, canned beans with added salt. Fats and Sweets (limited)  Eat More Often: Vegetable oils, tub margarines without trans fats, sugar-free gelatin. Mayonnaise and salad dressings.  Eat Less Often/Avoid: Coconut oils, palm oils, butter, stick margarine, cream, half and half, cookies, candy, pie. FOR MORE INFORMATION The Dash Diet Eating Plan: www.dashdiet.org Document Released: 04/24/2011 Document Revised: 07/28/2011 Document Reviewed: 04/24/2011 [facility] Patient Information 2014 [place], [place]. Smoking Cessation Quitting smoking is important to your health and has many advantages. However, it is not always easy to quit since nicotine is a very addictive drug. Often times, people try 3 times or more before being able to quit. This document explains the best ways for you to prepare to quit smoking. Quitting takes hard work and a lot of effort, but you can do it. ADVANTAGES OF QUITTING SMOKING  You will live longer, feel better, and live better.  Your body will feel the  impact of quitting smoking almost immediately.  Within 20 minutes, blood pressure decreases. Your pulse returns to its normal level.  After 8 hours, carbon monoxide levels in the blood return to normal. Your oxygen level  increases.  After 24 hours, the chance of having a heart attack starts to decrease. Your breath, hair, and body stop smelling like smoke.  After 48 hours, damaged nerve endings begin to recover. Your sense of taste and smell improve.  After 72 hours, the body is virtually free of nicotine. Your bronchial tubes relax and breathing becomes easier.  After 2 to 12 weeks, lungs can hold more air. Exercise becomes easier and circulation improves.  The risk of having a heart attack, stroke, cancer, or lung disease is greatly reduced.  After 1 year, the risk of coronary heart disease is cut in half.  After 5 years, the risk of stroke falls to the same as a nonsmoker.  After 10 years, the risk of lung cancer is cut in half and the risk of other cancers decreases significantly.  After 15 years, the risk of coronary heart disease drops, usually to the level of a nonsmoker.  If you are pregnant, quitting smoking will improve your chances of having a healthy baby.  The people you live with, especially any children, will be healthier.  You will have extra money to spend on things other than cigarettes. QUESTIONS TO THINK ABOUT BEFORE ATTEMPTING TO QUIT You may want to talk about your answers with your caregiver.  Why do you want to quit?  If you tried to quit in the past, what helped and what did not?  What will be the most difficult situations for you after you quit? How will you plan to handle them?  Who can help you through the tough times? Your family? Friends? A caregiver?  What pleasures do you get from smoking? What ways can you still get pleasure if you quit? Here are some questions to ask your caregiver:  How can you help me to be successful at quitting?  What medicine do you think would be best for me and how should I take it?  What should I do if I need more help?  What is smoking withdrawal like? How can I get information on withdrawal? GET READY  Set a quit  date.  Change your environment by getting rid of all cigarettes, ashtrays, matches, and lighters in your home, car, or work. Do not let people smoke in your home.  Review your past attempts to quit. Think about what worked and what did not. GET SUPPORT AND ENCOURAGEMENT You have a better chance of being successful if you have help. You can get support in many ways.  Tell your family, friends, and co-workers that you are going to quit and need their support. Ask them not to smoke around you.  Get individual, group, or telephone counseling and support. Programs are available at [employer] and health centers. Call your local health department for information about programs in your area.  Spiritual beliefs and practices may help some smokers quit.  Download a "quit meter" on your computer to keep track of quit statistics, such as how long you have gone without smoking, cigarettes not smoked, and money saved.  Get a self-help book about quitting smoking and staying off of tobacco. [place] yourself from urges to smoke. Talk to someone, go for a walk, or occupy your time with a task.  Change your  normal routine. Take a different route to work. Drink tea instead of coffee. Eat breakfast in a different place.  Reduce your stress. Take a hot bath, exercise, or read a book.  Plan something enjoyable to do every day. Reward yourself for not smoking.  Explore interactive web-based programs that specialize in helping you quit. GET MEDICINE AND USE IT CORRECTLY Medicines can help you stop smoking and decrease the urge to smoke. Combining medicine with the above behavioral methods and support can greatly increase your chances of successfully quitting smoking.  Nicotine replacement therapy helps deliver nicotine to your body without the negative effects and risks of smoking. Nicotine replacement therapy includes nicotine gum, lozenges, inhalers, nasal sprays, and  skin patches. Some may be available over-the-counter and others require a prescription.  Antidepressant medicine helps people abstain from smoking, but how this works is unknown. This medicine is available by prescription.  Nicotinic receptor partial agonist medicine simulates the effect of nicotine in your brain. This medicine is available by prescription. Ask your caregiver for advice about which medicines to use and how to use them based on your health history. Your caregiver will tell you what side effects to look out for if you choose to be on a medicine or therapy. Carefully read the information on the package. Do not use any other product containing nicotine while using a nicotine replacement product.  RELAPSE OR DIFFICULT SITUATIONS Most relapses occur within the first 3 months after quitting. Do not be discouraged if you start smoking again. Remember, most people try several times before finally quitting. You may have symptoms of withdrawal because your body is used to nicotine. You may crave cigarettes, be irritable, feel very hungry, cough often, get headaches, or have difficulty concentrating. The withdrawal symptoms are only temporary. They are strongest when you first quit, but they will go away within 10 14 days. To reduce the chances of relapse, try to:  Avoid drinking alcohol. Drinking lowers your chances of successfully quitting.  Reduce the amount of caffeine you consume. Once you quit smoking, the amount of caffeine in your body increases and can give you symptoms, such as a rapid heartbeat, sweating, and anxiety.  Avoid smokers because they can make you want to smoke.  Do not let weight gain distract you. Many smokers will gain weight when they quit, usually less than 10 pounds. Eat a healthy diet and stay active. You can always lose the weight gained after you quit.  Find ways to improve your mood other than smoking. FOR MORE INFORMATION  www.smokefree.gov  Document  Released: 04/29/2001 Document Revised: 11/04/2011 Document Reviewed: 08/14/2011 [facility] Patient Information 2014 [place], [place].

## 2013-10-21 NOTE — Progress Notes (Signed)
Patient ID: Todd Ferguson, male   DOB: 08-Dec-1940, 73 y.o.   MRN: [id number]  CC: f/y HTN, hypertriglyceridemia  HPI: Patient presents to clinic today for follou up of HTN and hypertriglyceridemia.  Patient reports that he has been out of his medication for one month but when looking through his pill bottles he had medication remaining in the packet.  Unsure if patient is compliant with medication management.  Patient reports that he does not eat healthy but he does ride his bike daily as a form of transportation.    No Known Allergies Past Medical History  Diagnosis Date  . Hypertension    Current Outpatient Prescriptions on File Prior to Visit  Medication Sig Dispense Refill  . hydrochlorothiazide (HYDRODIURIL) 25 MG tablet Take 1 tablet (25 mg total) by mouth daily.  60 tablet  2  . amLODipine (NORVASC) 10 MG tablet Take 1 tablet (10 mg total) by mouth daily.  90 tablet  3  . aspirin EC 81 MG tablet Take 1 tablet (81 mg total) by mouth daily.  30 tablet  6  . gemfibrozil (LOPID) 600 MG tablet Take 1 tablet (600 mg total) by mouth 2 (two) times daily before a meal.  60 tablet  2  . lisinopril (PRINIVIL,ZESTRIL) 40 MG tablet Take 1 tablet (40 mg total) by mouth daily.  30 tablet  2  . nicotine (NICODERM CQ) 21 mg/24hr patch Place 1 patch (21 mg total) onto the skin daily.  28 patch  0  . Vitamin D, Ergocalciferol, (DRISDOL) 50000 UNITS CAPS capsule Take 1 capsule (50,000 Units total) by mouth every 7 (seven) days.  30 capsule  6   No current facility-administered medications on file prior to visit.   Family History  Problem Relation Age of Onset  . Diabetes Brother    History   Social History  . Marital Status: Legally Separated    Spouse Name: N/A    Number of Children: N/A  . Years of Education: N/A   Occupational History  . Not on file.   Social History Main Topics  . Smoking status: Current Every Day Smoker    Types: Cigarettes  . Smokeless tobacco: Not on file  .  Alcohol Use: No  . Drug Use: No  . Sexual Activity: Not on file   Other Topics Concern  . Not on file   Social History Narrative  . No narrative on file   Review of Systems  HENT: Negative.   Eyes: Negative for blurred vision and double vision.  Respiratory: Negative.   Cardiovascular: Negative for chest pain, palpitations, claudication and leg swelling.  Neurological: Negative.  Negative for headaches.  Endo/Heme/Allergies: Negative for polydipsia.      Objective:   Filed Vitals:   10/21/13 1031  BP: 181/82  Pulse:   Temp:   Resp:    Physical Exam  Vitals reviewed. Constitutional: He is oriented to person, place, and time. He appears well-nourished.  Neck: Normal range of motion. Neck supple. No JVD present.  Cardiovascular: Normal rate, regular rhythm and normal heart sounds.   Pulmonary/Chest: Effort normal and breath sounds normal.  Abdominal: Soft. Bowel sounds are normal.  Musculoskeletal: Normal range of motion. He exhibits no edema and no tenderness.  Neurological: He is alert and oriented to person, place, and time. He has normal reflexes.     Lab Results  Component Value Date   WBC 5.9 04/29/2013   HGB 15.7 04/29/2013   HCT 45.1 04/29/2013  MCV 86.4 04/29/2013   PLT 259 04/29/2013   Lab Results  Component Value Date   CREATININE 0.99 04/29/2013   BUN 7 04/29/2013   NA 137 04/29/2013   K 4.9 04/29/2013   CL 101 04/29/2013   CO2 28 04/29/2013    No results found for this basename: HGBA1C   Lipid Panel     Component Value Date/Time   CHOL 203* 04/29/2013 1214   TRIG 183* 04/29/2013 1214   HDL 55 04/29/2013 1214   CHOLHDL 3.7 04/29/2013 1214   VLDL 37 04/29/2013 1214   LDLCALC 111* 04/29/2013 1214       Assessment and plan:   Todd Ferguson was seen today for follow-up and hypertension.  Diagnoses and associated orders for this visit:  Essential hypertension, benign - hydrochlorothiazide (HYDRODIURIL) 25 MG tablet; Take 1 tablet (25 mg  total) by mouth daily. - lisinopril (PRINIVIL,ZESTRIL) 40 MG tablet; Take 1 tablet (40 mg total) by mouth daily. - amLODipine (NORVASC) 10 MG tablet; Take 1 tablet (10 mg total) by mouth daily. Stressed the importance of telling the truth about medication use in order to determine if he needs additional medication management. Also explained the effects of uncontrolled hypertension on the body to patient.  Hypertriglyceridemia - gemfibrozil (LOPID) 600 MG tablet; Take 1 tablet (600 mg total) by mouth 2 (two) times daily before a meal. - Hemoglobin A1C; Future  Smoking Stressed importance of smoking cessation relating to cardiovascular health.  Return in about 1 week (around 10/28/2013) for Nurse Visit-BP check and labs and 3 mo PCP for HTN.      Todd Ferguson, [facility] [phone] 10/21/2013, 10:41 AM

## 2013-10-28 ENCOUNTER — Ambulatory Visit: Payer: Medicare Other | Attending: Internal Medicine

## 2013-10-28 ENCOUNTER — Ambulatory Visit: Payer: Medicare Other | Attending: Internal Medicine | Admitting: *Deleted

## 2013-10-28 VITALS — BP 157/85 | HR 70 | Resp 16

## 2013-10-28 DIAGNOSIS — I10 Essential (primary) hypertension: Secondary | ICD-10-CM | POA: Diagnosis not present

## 2013-10-28 DIAGNOSIS — E781 Pure hyperglyceridemia: Secondary | ICD-10-CM | POA: Diagnosis not present

## 2013-10-28 LAB — COMPLETE METABOLIC PANEL WITH GFR
ALT: 12 U/L (ref 0–53)
AST: 13 U/L (ref 0–37)
Albumin: 4.4 g/dL (ref 3.5–5.2)
Alkaline Phosphatase: 48 U/L (ref 39–117)
BILIRUBIN TOTAL: 0.3 mg/dL (ref 0.2–1.2)
BUN: 16 mg/dL (ref 6–23)
CALCIUM: 9.8 mg/dL (ref 8.4–10.5)
CHLORIDE: 103 meq/L (ref 96–112)
CO2: 21 mEq/L (ref 19–32)
Creat: 1.17 mg/dL (ref 0.50–1.35)
GFR, Est African American: 73 mL/min
GFR, Est Non African American: 63 mL/min
Glucose, Bld: 102 mg/dL — ABNORMAL HIGH (ref 70–99)
POTASSIUM: 4.6 meq/L (ref 3.5–5.3)
Sodium: 136 mEq/L (ref 135–145)
Total Protein: 7.6 g/dL (ref 6.0–8.3)

## 2013-10-28 LAB — LIPID PANEL
CHOL/HDL RATIO: 3.3 ratio
Cholesterol: 177 mg/dL (ref 0–200)
HDL: 54 mg/dL (ref >39)
LDL Cholesterol: 107 mg/dL — ABNORMAL HIGH (ref 0–99)
Triglycerides: 78 mg/dL (ref <150)
VLDL: 16 mg/dL (ref 0–40)

## 2013-10-28 LAB — HEMOGLOBIN A1C
HEMOGLOBIN A1C: 6.1 % — AB (ref <5.7)
MEAN PLASMA GLUCOSE: 128 mg/dL — AB (ref <117)

## 2013-10-28 NOTE — Patient Instructions (Signed)
Hypertension Hypertension is another name for high blood pressure. High blood pressure may mean that your heart needs to work harder to pump blood. Blood pressure consists of two numbers, which includes a higher number over a lower number (example: 110/72). HOME CARE   Make lifestyle changes as told by your doctor. This may include weight loss and exercise.  Take your blood pressure medicine every day.  Limit how much salt you use.  Stop smoking if you smoke.  Do not use drugs.  Talk to your doctor if you are using decongestants or birth control pills. These medicines might make blood pressure higher.  Females should not drink more than 1 alcoholic drink per day. Males should not drink more than 2 alcoholic drinks per day.  See your doctor as told. GET HELP RIGHT AWAY IF:   You have a blood pressure reading with a top number of 180 or higher.  You get a very bad headache.  You get blurred or changing vision.  You feel confused.  You feel weak, numb, or faint.  You get chest or belly (abdominal) pain.  You throw up (vomit).  You cannot breathe very well. MAKE SURE YOU:   Understand these instructions.  Will watch your condition.  Will get help right away if you are not doing well or get worse. Document Released: 10/22/2007 Document Revised: 07/28/2011 Document Reviewed: 10/22/2007 [employer] Patient Information 2014 [employer].  

## 2013-10-31 ENCOUNTER — Telehealth: Payer: Self-pay | Admitting: Emergency Medicine

## 2013-10-31 NOTE — Telephone Encounter (Signed)
Message copied by Ricci Barker on Mon Oct 31, 2013  2:07 PM ------      Message from: Lance Bosch      Created: Sun Oct 30, 2013  6:24 PM       Let patient know his labs are WNL. Explain to him that his cholesterol and triglycerides have improved and he can discontinue the gemfibrozil.  Reinforce diet and exercise so that he can stay off medication. Thanks. ------

## 2013-10-31 NOTE — Telephone Encounter (Signed)
Pt given lab results with new instructions to discontinue Gemfibrozil. Pt verbalized understanding. Pt also given instructions to continue exercising and diet control to keep cholesterol normal.

## 2013-12-01 ENCOUNTER — Other Ambulatory Visit: Payer: Medicare Other

## 2014-01-19 ENCOUNTER — Encounter: Payer: Self-pay | Admitting: Internal Medicine

## 2014-01-19 ENCOUNTER — Ambulatory Visit: Payer: Medicare Other | Attending: Internal Medicine | Admitting: Internal Medicine

## 2014-01-19 VITALS — BP 173/82 | HR 67 | Temp 98.2°F | Resp 22 | Ht 64.0 in | Wt 203.0 lb

## 2014-01-19 DIAGNOSIS — I714 Abdominal aortic aneurysm, without rupture, unspecified: Secondary | ICD-10-CM

## 2014-01-19 DIAGNOSIS — Z23 Encounter for immunization: Secondary | ICD-10-CM

## 2014-01-19 DIAGNOSIS — Z87891 Personal history of nicotine dependence: Secondary | ICD-10-CM | POA: Diagnosis not present

## 2014-01-19 DIAGNOSIS — Z1389 Encounter for screening for other disorder: Secondary | ICD-10-CM | POA: Insufficient documentation

## 2014-01-19 DIAGNOSIS — Z1211 Encounter for screening for malignant neoplasm of colon: Secondary | ICD-10-CM | POA: Insufficient documentation

## 2014-01-19 DIAGNOSIS — I10 Essential (primary) hypertension: Secondary | ICD-10-CM | POA: Insufficient documentation

## 2014-01-19 DIAGNOSIS — Z7982 Long term (current) use of aspirin: Secondary | ICD-10-CM | POA: Diagnosis not present

## 2014-01-19 DIAGNOSIS — Z136 Encounter for screening for cardiovascular disorders: Secondary | ICD-10-CM | POA: Diagnosis not present

## 2014-01-19 MED ORDER — AMLODIPINE BESYLATE 10 MG PO TABS: 10.0000 mg | ORAL_TABLET | Freq: Every day | ORAL | Status: DC

## 2014-01-19 MED ORDER — HYDROCHLOROTHIAZIDE 25 MG PO TABS: 25.0000 mg | ORAL_TABLET | Freq: Every day | ORAL | Status: DC

## 2014-01-19 MED ORDER — LISINOPRIL 40 MG PO TABS: 40.0000 mg | ORAL_TABLET | Freq: Every day | ORAL | Status: DC

## 2014-01-19 NOTE — Patient Instructions (Signed)
DASH Eating Plan DASH stands for "Dietary Approaches to Stop Hypertension." The DASH eating plan is a healthy eating plan that has been shown to reduce high blood pressure (hypertension). Additional health benefits may include reducing the risk of type 2 diabetes mellitus, heart disease, and stroke. The DASH eating plan may also help with weight loss. WHAT DO I NEED TO KNOW ABOUT THE DASH EATING PLAN? For the DASH eating plan, you will follow these general guidelines:  Choose foods with a percent daily value for sodium of less than 5% (as listed on the food label).  Use salt-free seasonings or herbs instead of table salt or sea salt.  Check with your health care provider or pharmacist before using salt substitutes.  Eat lower-sodium products, often labeled as "lower sodium" or "no salt added."  Eat fresh foods.  Eat more vegetables, fruits, and low-fat dairy products.  Choose whole grains. Look for the word "whole" as the first word in the ingredient list.  Choose fish and skinless chicken or turkey more often than red meat. Limit fish, poultry, and meat to 6 oz (170 g) each day.  Limit sweets, desserts, sugars, and sugary drinks.  Choose heart-healthy fats.  Limit cheese to 1 oz (28 g) per day.  Eat more home-cooked food and less restaurant, buffet, and fast food.  Limit fried foods.  Cook foods using methods other than frying.  Limit canned vegetables. If you do use them, rinse them well to decrease the sodium.  When eating at a restaurant, ask that your food be prepared with less salt, or no salt if possible. WHAT FOODS CAN I EAT? Seek help from a dietitian for individual calorie needs. Grains Whole grain or whole wheat bread. Brown rice. Whole grain or whole wheat pasta. Quinoa, bulgur, and whole grain cereals. Low-sodium cereals. Corn or whole wheat flour tortillas. Whole grain cornbread. Whole grain crackers. Low-sodium crackers. Vegetables Fresh or frozen vegetables  (raw, steamed, roasted, or grilled). Low-sodium or reduced-sodium tomato and vegetable juices. Low-sodium or reduced-sodium tomato sauce and paste. Low-sodium or reduced-sodium canned vegetables.  Fruits All fresh, canned (in natural juice), or frozen fruits. Meat and Other Protein Products Ground beef (85% or leaner), grass-fed beef, or beef trimmed of fat. Skinless chicken or turkey. Ground chicken or turkey. Pork trimmed of fat. All fish and seafood. Eggs. Dried beans, peas, or lentils. Unsalted nuts and seeds. Unsalted canned beans. Dairy Low-fat dairy products, such as skim or 1% milk, 2% or reduced-fat cheeses, low-fat ricotta or cottage cheese, or plain low-fat yogurt. Low-sodium or reduced-sodium cheeses. Fats and Oils Tub margarines without trans fats. Light or reduced-fat mayonnaise and salad dressings (reduced sodium). Avocado. Safflower, olive, or canola oils. Natural peanut or almond butter. Other Unsalted popcorn and pretzels. The items listed above may not be a complete list of recommended foods or beverages. Contact your dietitian for more options. WHAT FOODS ARE NOT RECOMMENDED? Grains White bread. White pasta. White rice. Refined cornbread. Bagels and croissants. Crackers that contain trans fat. Vegetables Creamed or fried vegetables. Vegetables in a cheese sauce. Regular canned vegetables. Regular canned tomato sauce and paste. Regular tomato and vegetable juices. Fruits Dried fruits. Canned fruit in light or heavy syrup. Fruit juice. Meat and Other Protein Products Fatty cuts of meat. Ribs, chicken wings, bacon, sausage, bologna, salami, chitterlings, fatback, hot dogs, bratwurst, and packaged luncheon meats. Salted nuts and seeds. Canned beans with salt. Dairy Whole or 2% milk, cream, half-and-half, and cream cheese. Whole-fat or sweetened yogurt. Full-fat   cheeses or blue cheese. Nondairy creamers and whipped toppings. Processed cheese, cheese spreads, or cheese  curds. Condiments Onion and garlic salt, seasoned salt, table salt, and sea salt. Canned and packaged gravies. Worcestershire sauce. Tartar sauce. Barbecue sauce. Teriyaki sauce. Soy sauce, including reduced sodium. Steak sauce. Fish sauce. Oyster sauce. Cocktail sauce. Horseradish. Ketchup and mustard. Meat flavorings and tenderizers. Bouillon cubes. Hot sauce. Tabasco sauce. Marinades. Taco seasonings. Relishes. Fats and Oils Butter, stick margarine, lard, shortening, ghee, and bacon fat. Coconut, palm kernel, or palm oils. Regular salad dressings. Other Pickles and olives. Salted popcorn and pretzels. The items listed above may not be a complete list of foods and beverages to avoid. Contact your dietitian for more information. WHERE CAN I FIND MORE INFORMATION? National Heart, [employer], and [employer] Institute: www.nhlbi.nih.gov/health/health-topics/topics/dash/ Document Released: 04/24/2011 Document Revised: 09/19/2013 Document Reviewed: 03/09/2013 ExitCare Patient Information 2015 [employer]. This information is not intended to replace advice given to you by your health care provider. Make sure you discuss any questions you have with your health care provider. Hypertension Hypertension, commonly called high blood pressure, is when the force of blood pumping through your arteries is too strong. Your arteries are the blood vessels that carry blood from your heart throughout your body. A blood pressure reading consists of a higher number over a lower number, such as 110/72. The higher number (systolic) is the pressure inside your arteries when your heart pumps. The lower number (diastolic) is the pressure inside your arteries when your heart relaxes. Ideally you want your blood pressure below 120/80. Hypertension forces your heart to work harder to pump blood. Your arteries may become narrow or stiff. Having hypertension puts you at risk for heart disease, stroke, and other problems.  RISK  FACTORS Some risk factors for high blood pressure are controllable. Others are not.  Risk factors you cannot control include:   Race. You may be at higher risk if you are African American.  Age. Risk increases with age.  Gender. Men are at higher risk than women before age 45 years. After age 65, women are at higher risk than men. Risk factors you can control include:  Not getting enough exercise or physical activity.  Being overweight.  Getting too much fat, sugar, calories, or salt in your diet.  Drinking too much alcohol. SIGNS AND SYMPTOMS Hypertension does not usually cause signs or symptoms. Extremely high blood pressure (hypertensive crisis) may cause headache, anxiety, shortness of breath, and nosebleed. DIAGNOSIS  To check if you have hypertension, your health care provider will measure your blood pressure while you are seated, with your arm held at the level of your heart. It should be measured at least twice using the same arm. Certain conditions can cause a difference in blood pressure between your right and left arms. A blood pressure reading that is higher than normal on one occasion does not mean that you need treatment. If one blood pressure reading is high, ask your health care provider about having it checked again. TREATMENT  Treating high blood pressure includes making lifestyle changes and possibly taking medicine. Living a healthy lifestyle can help lower high blood pressure. You may need to change some of your habits. Lifestyle changes may include:  Following the DASH diet. This diet is high in fruits, vegetables, and whole grains. It is low in salt, red meat, and added sugars.  Getting at least 2 hours of brisk physical activity every week.  Losing weight if necessary.  Not smoking.  Limiting   alcoholic beverages.  Learning ways to reduce stress. If lifestyle changes are not enough to get your blood pressure under control, your health care provider may  prescribe medicine. You may need to take more than one. Work closely with your health care provider to understand the risks and benefits. HOME CARE INSTRUCTIONS  Have your blood pressure rechecked as directed by your health care provider.   Take medicines only as directed by your health care provider. Follow the directions carefully. Blood pressure medicines must be taken as prescribed. The medicine does not work as well when you skip doses. Skipping doses also puts you at risk for problems.   Do not smoke.   Monitor your blood pressure at home as directed by your health care provider. SEEK MEDICAL CARE IF:   You think you are having a reaction to medicines taken.  You have recurrent headaches or feel dizzy.  You have swelling in your ankles.  You have trouble with your vision. SEEK IMMEDIATE MEDICAL CARE IF:  You develop a severe headache or confusion.  You have unusual weakness, numbness, or feel faint.  You have severe chest or abdominal pain.  You vomit repeatedly.  You have trouble breathing. MAKE SURE YOU:   Understand these instructions.  Will watch your condition.  Will get help right away if you are not doing well or get worse. Document Released: 05/05/2005 Document Revised: 09/19/2013 Document Reviewed: 02/25/2013 ExitCare Patient Information 2015 [employer]. This information is not intended to replace advice given to you by your health care provider. Make sure you discuss any questions you have with your health care provider.  

## 2014-01-19 NOTE — Progress Notes (Signed)
Patient present for 3 month f/u on HTN States ran out of all meds 3 weeks ago BP recheck 176/74 left arm manually

## 2014-01-19 NOTE — Progress Notes (Signed)
Patient ID: Todd Ferguson, male   DOB: 1940/12/30, 73 y.o.   MRN: [id number]   Todd Ferguson, is a 73 y.o. male  [id number]  [id number]  DOB - 12-26-1940  Chief Complaint  Patient presents with  . Follow-up        Subjective:   Todd Ferguson is a 73 y.o. male here today for a follow up visit. Patient has history of hypertension here for routine follow-up and medication refills. Patient has run out of all his medications more than 3 weeks ago. Blood pressure is high today. Patient reports when he takes his medication blood pressure is under control. Ex-smoker. Due for colonoscopy. Patient has No headache, No chest pain, No abdominal pain - No Nausea, No new weakness tingling or numbness, No Cough - SOB.  Problem  Special Screening for Malignant Neoplasms, Colon  Encounter for Abdominal Aortic Aneurysm Screening    ALLERGIES: No Known Allergies  PAST MEDICAL HISTORY: Past Medical History  Diagnosis Date  . Hypertension     MEDICATIONS AT HOME: Prior to Admission medications   Medication Sig Start Date End Date Taking? Authorizing Provider  amLODipine (NORVASC) 10 MG tablet Take 1 tablet (10 mg total) by mouth daily. 01/19/14   Tresa Garter, MD  aspirin EC 81 MG tablet Take 1 tablet (81 mg total) by mouth daily. 05/10/13   Reyne Dumas, MD  hydrochlorothiazide (HYDRODIURIL) 25 MG tablet Take 1 tablet (25 mg total) by mouth daily. 01/19/14   Tresa Garter, MD  lisinopril (PRINIVIL,ZESTRIL) 40 MG tablet Take 1 tablet (40 mg total) by mouth daily. 01/19/14   Tresa Garter, MD  nicotine (NICODERM CQ) 21 mg/24hr patch Place 1 patch (21 mg total) onto the skin daily. 07/11/13   Lorayne Marek, MD  Vitamin D, Ergocalciferol, (DRISDOL) 50000 UNITS CAPS capsule Take 1 capsule (50,000 Units total) by mouth every 7 (seven) days. 05/10/13   Reyne Dumas, MD     Objective:   Filed Vitals:   01/19/14 1007  BP: 173/82  Pulse: 67  Temp: 98.2 F (36.8 C)    TempSrc: Oral  Resp: 22  Height: 5\' 4"  (1.626 m)  Weight: 203 lb (92.08 kg)  SpO2: 95%    Exam General appearance : Awake, alert, not in any distress. Speech Clear. Not toxic looking HEENT: Atraumatic and Normocephalic, pupils equally reactive to light and accomodation Neck: supple, no JVD. No cervical lymphadenopathy.  Chest:Good air entry bilaterally, no added sounds  CVS: S1 S2 regular, no murmurs.  Abdomen: Bowel sounds present, Non tender and not distended with no gaurding, rigidity or rebound. Extremities: B/L Lower Ext shows no edema, both legs are warm to touch Neurology: Awake alert, and oriented X 3, CN II-XII intact, Non focal Skin:No Rash Wounds:N/A  Data Review Lab Results  Component Value Date   HGBA1C 6.1* 10/28/2013     Assessment & Plan   1. Essential hypertension, benign  - amLODipine (NORVASC) 10 MG tablet; Take 1 tablet (10 mg total) by mouth daily.  Dispense: 90 tablet; Refill: 3 - hydrochlorothiazide (HYDRODIURIL) 25 MG tablet; Take 1 tablet (25 mg total) by mouth daily.  Dispense: 90 tablet; Refill: 3 - lisinopril (PRINIVIL,ZESTRIL) 40 MG tablet; Take 1 tablet (40 mg total) by mouth daily.  Dispense: 90 tablet; Refill: 3  2. Special screening for malignant neoplasms, colon  - Ambulatory referral to Gastroenterology  3. Encounter for abdominal aortic aneurysm screening  - [place] Screening AAA; Future   Patient was counseled extensively about  nutrition and exercise.   Return in about 6 months (around 07/20/2014), or if symptoms worsen or fail to improve, for Follow up HTN.  The patient was given clear instructions to go to ER or return to medical center if symptoms don't improve, worsen or new problems develop. The patient verbalized understanding. The patient was told to call to get lab results if they haven't heard anything in the next week.   This note has been created with Surveyor, quantity. Any  transcriptional errors are unintentional.    Angelica Chessman, MD, [place], [place], [place] and [place], [place]   01/19/2014, 10:41 AM

## 2014-01-30 ENCOUNTER — Ambulatory Visit (HOSPITAL_COMMUNITY)
Admission: RE | Admit: 2014-01-30 | Discharge: 2014-01-30 | Disposition: A | Discharge status: Home / Self Care | Payer: Medicare Other | Source: Ambulatory Visit | Attending: Internal Medicine | Admitting: Internal Medicine

## 2014-01-30 DIAGNOSIS — I714 Abdominal aortic aneurysm, without rupture, unspecified: Secondary | ICD-10-CM

## 2014-02-01 ENCOUNTER — Ambulatory Visit (HOSPITAL_COMMUNITY)
Admission: RE | Admit: 2014-02-01 | Discharge: 2014-02-01 | Disposition: A | Discharge status: Home / Self Care | Payer: Medicare Other | Source: Ambulatory Visit | Attending: Internal Medicine | Admitting: Internal Medicine

## 2014-02-01 DIAGNOSIS — Z1389 Encounter for screening for other disorder: Secondary | ICD-10-CM | POA: Diagnosis not present

## 2014-02-01 DIAGNOSIS — Z136 Encounter for screening for cardiovascular disorders: Secondary | ICD-10-CM | POA: Diagnosis not present

## 2014-02-13 ENCOUNTER — Telehealth: Payer: Self-pay | Admitting: Emergency Medicine

## 2014-02-13 NOTE — Telephone Encounter (Signed)
Pt given u/s results

## 2014-02-13 NOTE — Telephone Encounter (Signed)
Message copied by Ricci Barker on Mon Feb 13, 2014 10:16 AM ------      Message from: Angelica Chessman E      Created: Wed Feb 08, 2014  9:42 AM       Please inform patient that his screening abdominal ultrasound is negative for aortic aneurysm. ------

## 2014-02-24 ENCOUNTER — Encounter: Payer: Self-pay | Admitting: Internal Medicine

## 2014-06-09 ENCOUNTER — Emergency Department (HOSPITAL_COMMUNITY): Payer: Medicare Other

## 2014-06-09 ENCOUNTER — Emergency Department (HOSPITAL_COMMUNITY)
Admission: EM | Admit: 2014-06-09 | Discharge: 2014-06-09 | Disposition: A | Discharge status: Home / Self Care | Payer: Medicare Other | Attending: Emergency Medicine | Admitting: Emergency Medicine

## 2014-06-09 ENCOUNTER — Encounter (HOSPITAL_COMMUNITY): Payer: Self-pay | Admitting: Emergency Medicine

## 2014-06-09 DIAGNOSIS — J069 Acute upper respiratory infection, unspecified: Secondary | ICD-10-CM

## 2014-06-09 DIAGNOSIS — Z7982 Long term (current) use of aspirin: Secondary | ICD-10-CM | POA: Insufficient documentation

## 2014-06-09 DIAGNOSIS — R509 Fever, unspecified: Secondary | ICD-10-CM | POA: Diagnosis not present

## 2014-06-09 DIAGNOSIS — R066 Hiccough: Secondary | ICD-10-CM

## 2014-06-09 DIAGNOSIS — R42 Dizziness and giddiness: Secondary | ICD-10-CM | POA: Diagnosis not present

## 2014-06-09 DIAGNOSIS — I517 Cardiomegaly: Secondary | ICD-10-CM | POA: Diagnosis not present

## 2014-06-09 DIAGNOSIS — I10 Essential (primary) hypertension: Secondary | ICD-10-CM | POA: Diagnosis not present

## 2014-06-09 DIAGNOSIS — R05 Cough: Secondary | ICD-10-CM

## 2014-06-09 DIAGNOSIS — Z79899 Other long term (current) drug therapy: Secondary | ICD-10-CM | POA: Insufficient documentation

## 2014-06-09 DIAGNOSIS — Z720 Tobacco use: Secondary | ICD-10-CM | POA: Diagnosis not present

## 2014-06-09 DIAGNOSIS — R059 Cough, unspecified: Secondary | ICD-10-CM

## 2014-06-09 DIAGNOSIS — R404 Transient alteration of awareness: Secondary | ICD-10-CM | POA: Diagnosis not present

## 2014-06-09 LAB — COMPREHENSIVE METABOLIC PANEL
ALT: 11 U/L (ref 0–53)
ANION GAP: 6 (ref 5–15)
AST: 17 U/L (ref 0–37)
Albumin: 3.9 g/dL (ref 3.5–5.2)
Alkaline Phosphatase: 42 U/L (ref 39–117)
BILIRUBIN TOTAL: 0.3 mg/dL (ref 0.3–1.2)
BUN: 10 mg/dL (ref 6–23)
CO2: 23 mmol/L (ref 19–32)
Calcium: 8.5 mg/dL (ref 8.4–10.5)
Chloride: 104 mEq/L (ref 96–112)
Creatinine, Ser: 1.19 mg/dL (ref 0.50–1.35)
GFR, EST AFRICAN AMERICAN: 69 mL/min — AB (ref >90)
GFR, EST NON AFRICAN AMERICAN: 60 mL/min — AB (ref >90)
Glucose, Bld: 99 mg/dL (ref 70–99)
Potassium: 3.5 mmol/L (ref 3.5–5.1)
Sodium: 133 mmol/L — ABNORMAL LOW (ref 135–145)
Total Protein: 6.9 g/dL (ref 6.0–8.3)

## 2014-06-09 LAB — CBC WITH DIFFERENTIAL/PLATELET
BASOS PCT: 0 % (ref 0–1)
Basophils Absolute: 0 10*3/uL (ref 0.0–0.1)
EOS ABS: 0.1 10*3/uL (ref 0.0–0.7)
EOS PCT: 2 % (ref 0–5)
HCT: 41.8 % (ref 39.0–52.0)
HEMOGLOBIN: 14 g/dL (ref 13.0–17.0)
LYMPHS PCT: 32 % (ref 12–46)
Lymphs Abs: 1.2 10*3/uL (ref 0.7–4.0)
MCH: 30 pg (ref 26.0–34.0)
MCHC: 33.5 g/dL (ref 30.0–36.0)
MCV: 89.5 fL (ref 78.0–100.0)
MONO ABS: 0.5 10*3/uL (ref 0.1–1.0)
Monocytes Relative: 14 % — ABNORMAL HIGH (ref 3–12)
NEUTROS ABS: 1.9 10*3/uL (ref 1.7–7.7)
Neutrophils Relative %: 51 % (ref 43–77)
Platelets: 181 10*3/uL (ref 150–400)
RBC: 4.67 MIL/uL (ref 4.22–5.81)
RDW: 13.9 % (ref 11.5–15.5)
WBC: 3.8 10*3/uL — ABNORMAL LOW (ref 4.0–10.5)

## 2014-06-09 MED ORDER — ACETAMINOPHEN 325 MG PO TABS
650.0000 mg | ORAL_TABLET | Freq: Four times a day (QID) | ORAL | Status: DC | PRN
  Administered 2014-06-09: 650 mg via ORAL
  Filled 2014-06-09: qty 2

## 2014-06-09 NOTE — ED Notes (Signed)
Notified RN, [place] pt. Temperature elevated 102.3

## 2014-06-09 NOTE — ED Notes (Signed)
Pt. Made aware for then need of urine.

## 2014-06-09 NOTE — Discharge Instructions (Signed)
Fever, Adult A fever is a higher than normal body temperature. In an adult, an oral temperature around 98.6 F (37 C) is considered normal. A temperature of 100.4 F (38 C) or higher is generally considered a fever. Mild or moderate fevers generally have no long-term effects and often do not require treatment. Extreme fever (greater than or equal to 106 F or 41.1 C) can cause seizures. The sweating that may occur with repeated or prolonged fever may cause dehydration. Elderly people can develop confusion during a fever. A measured temperature can vary with:  Age.  Time of day.  Method of measurement (mouth, underarm, rectal, or ear). The fever is confirmed by taking a temperature with a thermometer. Temperatures can be taken different ways. Some methods are accurate and some are not.  An oral temperature is used most commonly. Electronic thermometers are fast and accurate.  An ear temperature will only be accurate if the thermometer is positioned as recommended by the manufacturer.  A rectal temperature is accurate and done for those adults who have a condition where an oral temperature cannot be taken.  An underarm (axillary) temperature is not accurate and not recommended. Fever is a symptom, not a disease.  CAUSES   Infections commonly cause fever.  Some noninfectious causes for fever include:  Some arthritis conditions.  Some thyroid or adrenal gland conditions.  Some immune system conditions.  Some types of cancer.  A medicine reaction.  High doses of certain street drugs such as methamphetamine.  Dehydration.  Exposure to high outside or room temperatures.  Occasionally, the source of a fever cannot be determined. This is sometimes called a "fever of unknown origin" (FUO).  Some situations may lead to a temporary rise in body temperature that may go away on its own. Examples are:  Childbirth.  Surgery.  Intense exercise. HOME CARE INSTRUCTIONS   Take  appropriate medicines for fever. Follow dosing instructions carefully. If you use acetaminophen to reduce the fever, be careful to avoid taking other medicines that also contain acetaminophen. Do not take aspirin for a fever if you are younger than age 67. There is an association with Reye's syndrome. Reye's syndrome is a rare but potentially deadly disease.  If an infection is present and antibiotics have been prescribed, take them as directed. Finish them even if you start to feel better.  Rest as needed.  Maintain an adequate fluid intake. To prevent dehydration during an illness with prolonged or recurrent fever, you may need to drink extra fluid.Drink enough fluids to keep your urine clear or pale yellow.  Sponging or bathing with room temperature water may help reduce body temperature. Do not use ice water or alcohol sponge baths.  Dress comfortably, but do not over-bundle. SEEK MEDICAL CARE IF:   You are unable to keep fluids down.  You develop vomiting or diarrhea.  You are not feeling at least partly better after 3 days.  You develop new symptoms or problems. SEEK IMMEDIATE MEDICAL CARE IF:   You have shortness of breath or trouble breathing.  You develop excessive weakness.  You are dizzy or you faint.  You are extremely thirsty or you are making little or no urine.  You develop new pain that was not there before (such as in the head, neck, chest, back, or abdomen).  You have persistent vomiting and diarrhea for more than 1 to 2 days.  You develop a stiff neck or your eyes become sensitive to light.  You develop a  skin rash.  You have a fever or persistent symptoms for more than 2 to 3 days.  You have a fever and your symptoms suddenly get worse. MAKE SURE YOU:   Understand these instructions.  Will watch your condition.  Will get help right away if you are not doing well or get worse. Document Released: 10/29/2000 Document Revised: 09/19/2013 Document  Reviewed: 03/06/2011 [facility] Patient Information 2015 [place], [place]. This information is not intended to replace advice given to you by your health care provider. Make sure you discuss any questions you have with your health care provider.  Upper Respiratory Infection, Adult An upper respiratory infection (URI) is also sometimes known as the common cold. The upper respiratory tract includes the nose, sinuses, throat, trachea, and bronchi. Bronchi are the airways leading to the lungs. Most people improve within 1 week, but symptoms can last up to 2 weeks. A residual cough may last even longer.  CAUSES Many different viruses can infect the tissues lining the upper respiratory tract. The tissues become irritated and inflamed and often become very moist. Mucus production is also common. A cold is contagious. You can easily spread the virus to others by oral contact. This includes kissing, sharing a glass, coughing, or sneezing. Touching your mouth or nose and then touching a surface, which is then touched by another person, can also spread the virus. SYMPTOMS  Symptoms typically develop 1 to 3 days after you come in contact with a cold virus. Symptoms vary from person to person. They may include:  Runny nose.  Sneezing.  Nasal congestion.  Sinus irritation.  Sore throat.  Loss of voice (laryngitis).  Cough.  Fatigue.  Muscle aches.  Loss of appetite.  Headache.  Low-grade fever. DIAGNOSIS  You might diagnose your own cold based on familiar symptoms, since most people get a cold 2 to 3 times a year. Your caregiver can confirm this based on your exam. Most importantly, your caregiver can check that your symptoms are not due to another disease such as strep throat, sinusitis, pneumonia, asthma, or epiglottitis. Blood tests, throat tests, and X-rays are not necessary to diagnose a common cold, but they may sometimes be helpful in excluding other more serious diseases. Your caregiver will  decide if any further tests are required. RISKS AND COMPLICATIONS  You may be at risk for a more severe case of the common cold if you smoke cigarettes, have chronic heart disease (such as heart failure) or lung disease (such as asthma), or if you have a weakened immune system. The very young and very old are also at risk for more serious infections. Bacterial sinusitis, middle ear infections, and bacterial pneumonia can complicate the common cold. The common cold can worsen asthma and chronic obstructive pulmonary disease (COPD). Sometimes, these complications can require emergency medical care and may be life-threatening. PREVENTION  The best way to protect against getting a cold is to practice good hygiene. Avoid oral or hand contact with people with cold symptoms. Wash your hands often if contact occurs. There is no clear evidence that vitamin C, vitamin E, echinacea, or exercise reduces the chance of developing a cold. However, it is always recommended to get plenty of rest and practice good nutrition. TREATMENT  Treatment is directed at relieving symptoms. There is no cure. Antibiotics are not effective, because the infection is caused by a virus, not by bacteria. Treatment may include:  Increased fluid intake. Sports drinks offer valuable electrolytes, sugars, and fluids.  Breathing heated mist  or steam (vaporizer or shower).  Eating chicken soup or other clear broths, and maintaining good nutrition.  Getting plenty of rest.  Using gargles or lozenges for comfort.  Controlling fevers with ibuprofen or acetaminophen as directed by your caregiver.  Increasing usage of your inhaler if you have asthma. Zinc gel and zinc lozenges, taken in the first 24 hours of the common cold, can shorten the duration and lessen the severity of symptoms. Pain medicines may help with fever, muscle aches, and throat pain. A variety of non-prescription medicines are available to treat congestion and runny nose.  Your caregiver can make recommendations and may suggest nasal or lung inhalers for other symptoms.  HOME CARE INSTRUCTIONS   Only take over-the-counter or prescription medicines for pain, discomfort, or fever as directed by your caregiver.  Use a warm mist humidifier or inhale steam from a shower to increase air moisture. This may keep secretions moist and make it easier to breathe.  Drink enough water and fluids to keep your urine clear or pale yellow.  Rest as needed.  Return to work when your temperature has returned to normal or as your caregiver advises. You may need to stay home longer to avoid infecting others. You can also use a face mask and careful hand washing to prevent spread of the virus. SEEK MEDICAL CARE IF:   After the first few days, you feel you are getting worse rather than better.  You need your caregiver's advice about medicines to control symptoms.  You develop chills, worsening shortness of breath, or brown or red sputum. These may be signs of pneumonia.  You develop yellow or brown nasal discharge or pain in the face, especially when you bend forward. These may be signs of sinusitis.  You develop a fever, swollen neck glands, pain with swallowing, or white areas in the back of your throat. These may be signs of strep throat. SEEK IMMEDIATE MEDICAL CARE IF:   You have a fever.  You develop severe or persistent headache, ear pain, sinus pain, or chest pain.  You develop wheezing, a prolonged cough, cough up blood, or have a change in your usual mucus (if you have chronic lung disease).  You develop sore muscles or a stiff neck. Document Released: 10/29/2000 Document Revised: 07/28/2011 Document Reviewed: 08/10/2013 [facility] Patient Information 2015 [place], [place]. This information is not intended to replace advice given to you by your health care provider. Make sure you discuss any questions you have with your health care provider.  Hiccups A hiccup is the  result of a sudden shortening of the muscle below your lungs (diaphragm). This movement of your diaphragm is then followed by the closing of your vocal cords, which causes the hiccup sound. Most people get the hiccups. Typically, hiccups last only a short amount of time. There are three types of hiccups:   Benign: last less than 48 hours.  Persistent: last more than 48 hours, but less than 1 month.  Intractable: last more than 1 month. A hiccup is a reflex. You cannot control reflexes. CAUSES  Causes of the hiccups can include:   Eating too much.  Drinking too much alcohol or fizzy drinks.  Eating too fast.  Eating or drinking hot and spicy foods or drinks.  Using certain medicines that have hiccupping as a side effect. Several medical conditions may also cause hiccups, including, but not limited to:  Stroke.  Gastroesophageal reflux.  Multiple sclerosis.  Traumatic brain injury.  Brain tumor.  Meningitis.  Having damage to the nerve that affects the diaphragm. Usually, though, hiccups have no apparent cause and are not the result of a serious medical condition. DIAGNOSIS  Tests may be performed to diagnose a possible condition associated with persistent or intractable hiccups. TREATMENT  Most cases of the hiccups need no treatment. None of the numerous home remedies have been proven to be effective. If your hiccups do require treatment, your treatment may include:  Medicine. Medicine may be given intravenously (by IV) or by mouth.  Hypnosis or acupuncture.  Surgery to the nerve that affects the diaphragm may be tried in severe cases. If your hiccups are caused by an underlying medical condition, treatment for the medical condition may be necessary.  HOME CARE INSTRUCTIONS   Eat small meals.  Limit alcohol intake to no more than 1 drink per day for nonpregnant women and 2 drinks per day for men. One drink equals 12 ounces of beer, 5 ounces of wine, or 1 ounces of  hard liquor.  Limit drinking fizzy drinks.  Eat and chew your food slowly.  Take medicines only as directed by your health care provider. SEEK MEDICAL CARE IF:   Your hiccups last for more than 48 hours.  You are given medicine, but your hiccups do not get better.  You cannot sleep or eat due to the hiccups.  You have unexpected weight loss due to the hiccups.  You have trouble breathing or swallowing.  You have a fever.  You develop severe pain in your abdomen.  You develop numbness, tingling, or weakness. Document Released: 07/14/2001 Document Revised: 09/19/2013 Document Reviewed: 06/26/2010 [facility] Patient Information 2015 [place], [place]. This information is not intended to replace advice given to you by your health care provider. Make sure you discuss any questions you have with your health care provider.

## 2014-06-09 NOTE — ED Notes (Signed)
Patient presents from home via [facility] for dizziness & hiccups x2 days. Patient A&O x4.

## 2014-06-09 NOTE — ED Notes (Signed)
Bed: WA17 Expected date:  Expected time:  Means of arrival:  Comments: EMS 73yo dizziness, hiccups

## 2014-06-09 NOTE — ED Provider Notes (Signed)
CSN: [id number]     Arrival date & time 06/09/14  0125 History   First MD Initiated Contact with Patient 06/09/14 0230     Chief Complaint  Patient presents with  . Dizziness  . Hiccups     (Consider location/radiation/quality/duration/timing/severity/associated sxs/prior Treatment) Patient is a 74 y.o. male presenting with general illness.  Illness Quality:  Fevers, hiccups Severity:  Moderate Onset quality:  Gradual Duration:  2 days Timing:  Constant Progression:  Partially resolved Chronicity:  New Context:  His girlfriend also has URI symptoms. Relieved by:  Nothing Ineffective treatments:  None tried Associated symptoms: congestion, cough (dry) and fever   Associated symptoms: no abdominal pain, no chest pain, no nausea, no shortness of breath and no vomiting     Past Medical History  Diagnosis Date  . Hypertension    History reviewed. No pertinent past surgical history. Family History  Problem Relation Age of Onset  . Diabetes Brother   . Heart disease Mother    History  Substance Use Topics  . Smoking status: Current Every Day Smoker    Types: Cigarettes  . Smokeless tobacco: Never Used  . Alcohol Use: Yes     Comment: socially    Review of Systems  Constitutional: Positive for fever.  HENT: Positive for congestion.   Respiratory: Positive for cough (dry). Negative for shortness of breath.   Cardiovascular: Negative for chest pain.  Gastrointestinal: Negative for nausea, vomiting and abdominal pain.  All other systems reviewed and are negative.     Allergies  Review of patient's allergies indicates no known allergies.  Home Medications   Prior to Admission medications   Medication Sig Start Date End Date Taking? Authorizing Provider  amLODipine (NORVASC) 10 MG tablet Take 1 tablet (10 mg total) by mouth daily. 01/19/14  Yes Tresa Garter, MD  aspirin EC 81 MG tablet Take 1 tablet (81 mg total) by mouth daily. 05/10/13  Yes Reyne Dumas, MD   hydrochlorothiazide (HYDRODIURIL) 25 MG tablet Take 1 tablet (25 mg total) by mouth daily. 01/19/14  Yes Tresa Garter, MD  lisinopril (PRINIVIL,ZESTRIL) 40 MG tablet Take 1 tablet (40 mg total) by mouth daily. 01/19/14  Yes Olugbemiga Essie Christine, MD  nicotine (NICODERM CQ) 21 mg/24hr patch Place 1 patch (21 mg total) onto the skin daily. Patient not taking: Reported on 06/09/2014 07/11/13   Lorayne Marek, MD  Vitamin D, Ergocalciferol, (DRISDOL) 50000 UNITS CAPS capsule Take 1 capsule (50,000 Units total) by mouth every 7 (seven) days. Patient not taking: Reported on 06/09/2014 05/10/13   Reyne Dumas, MD   BP 139/71 mmHg  Pulse 62  Temp(Src) 98.2 F (36.8 C) (Oral)  Resp 20  SpO2 95% Physical Exam  Constitutional: He is oriented to person, place, and time. He appears well-developed and well-nourished. No distress.  HENT:  Head: Normocephalic and atraumatic.  Mouth/Throat: Oropharynx is clear and moist.  Eyes: Conjunctivae are normal. Pupils are equal, round, and reactive to light. No scleral icterus.  Neck: Neck supple.  Cardiovascular: Normal rate, regular rhythm, normal heart sounds and intact distal pulses.   No murmur heard. Pulmonary/Chest: Effort normal and breath sounds normal. No stridor. No respiratory distress. He has no wheezes. He has no rales.  Abdominal: Soft. He exhibits no distension. There is no tenderness. There is no rebound and no guarding.  Musculoskeletal: Normal range of motion. He exhibits no edema.  Neurological: He is alert and oriented to person, place, and time.  Skin: Skin is warm  and dry. No rash noted.  Psychiatric: He has a normal mood and affect. His behavior is normal.  Nursing note and vitals reviewed.   ED Course  Procedures (including critical care time) Labs Review Labs Reviewed  CBC WITH DIFFERENTIAL - Abnormal; Notable for the following:    WBC 3.8 (*)    Monocytes Relative 14 (*)    All other components within normal limits   COMPREHENSIVE METABOLIC PANEL - Abnormal; Notable for the following:    Sodium 133 (*)    GFR calc non Af Amer 60 (*)    GFR calc Af Amer 69 (*)    All other components within normal limits  CULTURE, BLOOD (ROUTINE X 2)  CULTURE, BLOOD (ROUTINE X 2)  URINE CULTURE  URINALYSIS, ROUTINE W REFLEX MICROSCOPIC    Imaging Review Dg Chest 2 View  06/09/2014   CLINICAL DATA:  Hiccups and cough for 2 days.  EXAM: CHEST  2 VIEW  COMPARISON:  None.  FINDINGS: The cardiac silhouette is mildly enlarged. Mediastinal silhouette is nonsuspicious: Mildly calcified aortic knob. No pleural effusions or focal consolidations. No pneumothorax. Mild degenerative change of thoracic spine. Soft tissue planes are unremarkable.  IMPRESSION: Mild cardiomegaly, no acute pulmonary process.   Electronically Signed   By: Elon Alas   On: 06/09/2014 02:38  All radiology studies independently viewed by me.      EKG Interpretation None      ekg- sinus rhythm, rate 74, normal axis, normal intervals, no priors for comparison.   MDM   Final diagnoses:  Cough  Fever, unspecified fever cause  Hiccups  Acute URI    74 year old male with history of hypertension who presents with fever, headaches, and URI symptoms. Well-appearing and nontoxic on exam. Lungs clear, no chest pain, no shortness of breath. Chest x-ray negative. His hiccups resolved prior to his arrival and he states that was the primary reason he had for calling the ambulance. All in all, his febrile illness appears to be related to an acute URI, likely viral. He does not appear to need hospitalization. He does not have evidence of bacterial infection. He appears stable for discharge home with primary care follow-up. He was given return precautions.    Houston Siren III, MD 06/09/14 [phone]

## 2014-06-09 NOTE — ED Notes (Signed)
EKG given to EDP, Wofford,MD., for review. 

## 2014-06-15 LAB — CULTURE, BLOOD (ROUTINE X 2)
Culture: NO GROWTH
Culture: NO GROWTH

## 2014-08-07 ENCOUNTER — Encounter: Payer: Self-pay | Admitting: Internal Medicine

## 2014-08-07 ENCOUNTER — Ambulatory Visit: Payer: Medicare Other | Attending: Internal Medicine | Admitting: Internal Medicine

## 2014-08-07 VITALS — BP 158/81 | HR 90 | Temp 98.0°F | Resp 18 | Ht 64.0 in | Wt 198.6 lb

## 2014-08-07 DIAGNOSIS — Z125 Encounter for screening for malignant neoplasm of prostate: Secondary | ICD-10-CM | POA: Insufficient documentation

## 2014-08-07 DIAGNOSIS — R7309 Other abnormal glucose: Secondary | ICD-10-CM | POA: Diagnosis not present

## 2014-08-07 DIAGNOSIS — Z720 Tobacco use: Secondary | ICD-10-CM | POA: Insufficient documentation

## 2014-08-07 DIAGNOSIS — E781 Pure hyperglyceridemia: Secondary | ICD-10-CM

## 2014-08-07 DIAGNOSIS — R7303 Prediabetes: Secondary | ICD-10-CM

## 2014-08-07 DIAGNOSIS — I10 Essential (primary) hypertension: Secondary | ICD-10-CM | POA: Insufficient documentation

## 2014-08-07 DIAGNOSIS — Z1211 Encounter for screening for malignant neoplasm of colon: Secondary | ICD-10-CM | POA: Diagnosis not present

## 2014-08-07 DIAGNOSIS — F17200 Nicotine dependence, unspecified, uncomplicated: Secondary | ICD-10-CM | POA: Insufficient documentation

## 2014-08-07 LAB — POCT GLYCOSYLATED HEMOGLOBIN (HGB A1C): Hemoglobin A1C: 6.4

## 2014-08-07 NOTE — Progress Notes (Signed)
Pt comes in for Wellness visit for HTN with medical management Pt states he is compliant with taking prescribed meds daily States he cut back to 5-6 cigarettes/per day Denies pain today
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
## 2014-08-07 NOTE — Patient Instructions (Signed)
Smoking Cessation Quitting smoking is important to your health and has many advantages. However, it is not always easy to quit since nicotine is a very addictive drug. Oftentimes, people try 3 times or more before being able to quit. This document explains the best ways for you to prepare to quit smoking. Quitting takes hard work and a lot of effort, but you can do it. ADVANTAGES OF QUITTING SMOKING You will live longer, feel better, and live better. Your body will feel the impact of quitting smoking almost immediately. Within 20 minutes, blood pressure decreases. Your pulse returns to its normal level. After 8 hours, carbon monoxide levels in the blood return to normal. Your oxygen level increases. After 24 hours, the chance of having a heart attack starts to decrease. Your breath, hair, and body stop smelling like smoke. After 48 hours, damaged nerve endings begin to recover. Your sense of taste and smell improve. After 72 hours, the body is virtually free of nicotine. Your bronchial tubes relax and breathing becomes easier. After 2 to 12 weeks, lungs can hold more air. Exercise becomes easier and circulation improves. The risk of having a heart attack, stroke, cancer, or lung disease is greatly reduced. After 1 year, the risk of coronary heart disease is cut in half. After 5 years, the risk of stroke falls to the same as a nonsmoker. After 10 years, the risk of lung cancer is cut in half and the risk of other cancers decreases significantly. After 15 years, the risk of coronary heart disease drops, usually to the level of a nonsmoker. If you are pregnant, quitting smoking will improve your chances of having a healthy baby. The people you live with, especially any children, will be healthier. You will have extra money to spend on things other than cigarettes. QUESTIONS TO THINK ABOUT BEFORE ATTEMPTING TO QUIT You may want to talk about your answers with your health care provider. Why do you  want to quit? If you tried to quit in the past, what helped and what did not? What will be the most difficult situations for you after you quit? How will you plan to handle them? Who can help you through the tough times? Your family? Friends? A health care provider? What pleasures do you get from smoking? What ways can you still get pleasure if you quit? Here are some questions to ask your health care provider: How can you help me to be successful at quitting? What medicine do you think would be best for me and how should I take it? What should I do if I need more help? What is smoking withdrawal like? How can I get information on withdrawal? GET READY Set a quit date. Change your environment by getting rid of all cigarettes, ashtrays, matches, and lighters in your home, car, or work. Do not let people smoke in your home. Review your past attempts to quit. Think about what worked and what did not. GET SUPPORT AND ENCOURAGEMENT You have a better chance of being successful if you have help. You can get support in many ways. Tell your family, friends, and coworkers that you are going to quit and need their support. Ask them not to smoke around you. Get individual, group, or telephone counseling and support. Programs are available at [employer] and health centers. Call your local health department for information about programs in your area. Spiritual beliefs and practices may help some smokers quit. Download a "quit meter" on your computer to keep track  of quit statistics, such as how long you have gone without smoking, cigarettes not smoked, and money saved. Get a self-help book about quitting smoking and staying off tobacco. Rio yourself from urges to smoke. Talk to someone, go for a walk, or occupy your time with a task. Change your normal routine. Take a different route to work. Drink tea instead of coffee. Eat breakfast in a different place. Reduce  your stress. Take a hot bath, exercise, or read a book. Plan something enjoyable to do every day. Reward yourself for not smoking. Explore interactive web-based programs that specialize in helping you quit. GET MEDICINE AND USE IT CORRECTLY Medicines can help you stop smoking and decrease the urge to smoke. Combining medicine with the above behavioral methods and support can greatly increase your chances of successfully quitting smoking. Nicotine replacement therapy helps deliver nicotine to your body without the negative effects and risks of smoking. Nicotine replacement therapy includes nicotine gum, lozenges, inhalers, nasal sprays, and skin patches. Some may be available over-the-counter and others require a prescription. Antidepressant medicine helps people abstain from smoking, but how this works is unknown. This medicine is available by prescription. Nicotinic receptor partial agonist medicine simulates the effect of nicotine in your brain. This medicine is available by prescription. Ask your health care provider for advice about which medicines to use and how to use them based on your health history. Your health care provider will tell you what side effects to look out for if you choose to be on a medicine or therapy. Carefully read the information on the package. Do not use any other product containing nicotine while using a nicotine replacement product.  RELAPSE OR DIFFICULT SITUATIONS Most relapses occur within the first 3 months after quitting. Do not be discouraged if you start smoking again. Remember, most people try several times before finally quitting. You may have symptoms of withdrawal because your body is used to nicotine. You may crave cigarettes, be irritable, feel very hungry, cough often, get headaches, or have difficulty concentrating. The withdrawal symptoms are only temporary. They are strongest when you first quit, but they will go away within 10-14 days. To reduce the chances  of relapse, try to: Avoid drinking alcohol. Drinking lowers your chances of successfully quitting. Reduce the amount of caffeine you consume. Once you quit smoking, the amount of caffeine in your body increases and can give you symptoms, such as a rapid heartbeat, sweating, and anxiety. Avoid smokers because they can make you want to smoke. Do not let weight gain distract you. Many smokers will gain weight when they quit, usually less than 10 pounds. Eat a healthy diet and stay active. You can always lose the weight gained after you quit. Find ways to improve your mood other than smoking. FOR MORE INFORMATION  www.smokefree.gov  Document Released: 04/29/2001 Document Revised: 09/19/2013 Document Reviewed: 08/14/2011 [facility] Patient Information 2015 [place], [place]. This information is not intended to replace advice given to you by your health care provider. Make sure you discuss any questions you have with your health care provider. DASH Eating Plan DASH stands for "Dietary Approaches to Stop Hypertension." The DASH eating plan is a healthy eating plan that has been shown to reduce high blood pressure (hypertension). Additional health benefits may include reducing the risk of type 2 diabetes mellitus, heart disease, and stroke. The DASH eating plan may also help with weight loss. WHAT DO I NEED TO KNOW ABOUT THE DASH EATING PLAN?  For the DASH eating plan, you will follow these general guidelines:  Choose foods with a percent daily value for sodium of less than 5% (as listed on the food label).  Use salt-free seasonings or herbs instead of table salt or sea salt.  Check with your health care provider or pharmacist before using salt substitutes.  Eat lower-sodium products, often labeled as "lower sodium" or "no salt added."  Eat fresh foods.  Eat more vegetables, fruits, and low-fat dairy products.  Choose whole grains. Look for the word "whole" as the first word in the ingredient  list.  Choose fish and skinless chicken or [place] more often than red meat. Limit fish, poultry, and meat to 6 oz (170 g) each day.  Limit sweets, desserts, sugars, and sugary drinks.  Choose heart-healthy fats.  Limit cheese to 1 oz (28 g) per day.  Eat more home-cooked food and less restaurant, buffet, and fast food.  Limit fried foods.  Cook foods using methods other than frying.  Limit canned vegetables. If you do use them, rinse them well to decrease the sodium.  When eating at a restaurant, ask that your food be prepared with less salt, or no salt if possible. WHAT FOODS CAN I EAT? Seek help from a dietitian for individual calorie needs. Grains Whole grain or whole wheat bread. Brown rice. Whole grain or whole wheat pasta. Quinoa, bulgur, and whole grain cereals. Low-sodium cereals. Corn or whole wheat flour tortillas. Whole grain cornbread. Whole grain crackers. Low-sodium crackers. Vegetables Fresh or frozen vegetables (raw, steamed, roasted, or grilled). Low-sodium or reduced-sodium tomato and vegetable juices. Low-sodium or reduced-sodium tomato sauce and paste. Low-sodium or reduced-sodium canned vegetables.  Fruits All fresh, canned (in natural juice), or frozen fruits. Meat and Other Protein Products Ground beef (85% or leaner), grass-fed beef, or beef trimmed of fat. Skinless chicken or [place]. Ground chicken or [place]. Pork trimmed of fat. All fish and seafood. Eggs. Dried beans, peas, or lentils. Unsalted nuts and seeds. Unsalted canned beans. Dairy Low-fat dairy products, such as skim or 1% milk, 2% or reduced-fat cheeses, low-fat ricotta or cottage cheese, or plain low-fat yogurt. Low-sodium or reduced-sodium cheeses. Fats and Oils Tub margarines without trans fats. Light or reduced-fat mayonnaise and salad dressings (reduced sodium). Avocado. Safflower, olive, or canola oils. Natural peanut or almond butter. Other Unsalted popcorn and pretzels. The items listed  above may not be a complete list of recommended foods or beverages. Contact your dietitian for more options. WHAT FOODS ARE NOT RECOMMENDED? Grains White bread. White pasta. White rice. Refined cornbread. Bagels and croissants. Crackers that contain trans fat. Vegetables Creamed or fried vegetables. Vegetables in a cheese sauce. Regular canned vegetables. Regular canned tomato sauce and paste. Regular tomato and vegetable juices. Fruits Dried fruits. Canned fruit in light or heavy syrup. Fruit juice. Meat and Other Protein Products Fatty cuts of meat. Ribs, chicken wings, bacon, sausage, bologna, salami, chitterlings, fatback, hot dogs, bratwurst, and packaged luncheon meats. Salted nuts and seeds. Canned beans with salt. Dairy Whole or 2% milk, cream, half-and-half, and cream cheese. Whole-fat or sweetened yogurt. Full-fat cheeses or blue cheese. Nondairy creamers and whipped toppings. Processed cheese, cheese spreads, or cheese curds. Condiments Onion and garlic salt, seasoned salt, table salt, and sea salt. Canned and packaged gravies. Worcestershire sauce. Tartar sauce. Barbecue sauce. Teriyaki sauce. Soy sauce, including reduced sodium. Steak sauce. Fish sauce. Oyster sauce. Cocktail sauce. Horseradish. Ketchup and mustard. Meat flavorings and tenderizers. Bouillon cubes. Hot sauce. Tabasco sauce.  Marinades. Taco seasonings. Relishes. Fats and Oils Butter, stick margarine, lard, shortening, ghee, and bacon fat. Coconut, palm kernel, or palm oils. Regular salad dressings. Other Pickles and olives. Salted popcorn and pretzels. The items listed above may not be a complete list of foods and beverages to avoid. Contact your dietitian for more information. WHERE CAN I FIND MORE INFORMATION? National Heart, Lung, and Blood Institute: travelstabloid.com Document Released: 04/24/2011 Document Revised: 09/19/2013 Document Reviewed: 03/09/2013 [facility] Patient  Information 2015 [place], [place]. This information is not intended to replace advice given to you by your health care provider. Make sure you discuss any questions you have with your health care provider. Hypertension Hypertension, commonly called high blood pressure, is when the force of blood pumping through your arteries is too strong. Your arteries are the blood vessels that carry blood from your heart throughout your body. A blood pressure reading consists of a higher number over a lower number, such as 110/72. The higher number (systolic) is the pressure inside your arteries when your heart pumps. The lower number (diastolic) is the pressure inside your arteries when your heart relaxes. Ideally you want your blood pressure below 120/80. Hypertension forces your heart to work harder to pump blood. Your arteries may become narrow or stiff. Having hypertension puts you at risk for heart disease, stroke, and other problems.  RISK FACTORS Some risk factors for high blood pressure are controllable. Others are not.  Risk factors you cannot control include:   Race. You may be at higher risk if you are African American.  Age. Risk increases with age.  Gender. Men are at higher risk than women before age 18 years. After age 55, women are at higher risk than men. Risk factors you can control include:  Not getting enough exercise or physical activity.  Being overweight.  Getting too much fat, sugar, calories, or salt in your diet.  Drinking too much alcohol. SIGNS AND SYMPTOMS Hypertension does not usually cause signs or symptoms. Extremely high blood pressure (hypertensive crisis) may cause headache, anxiety, shortness of breath, and nosebleed. DIAGNOSIS  To check if you have hypertension, your health care provider will measure your blood pressure while you are seated, with your arm held at the level of your heart. It should be measured at least twice using the same arm. Certain conditions can  cause a difference in blood pressure between your right and left arms. A blood pressure reading that is higher than normal on one occasion does not mean that you need treatment. If one blood pressure reading is high, ask your health care provider about having it checked again. TREATMENT  Treating high blood pressure includes making lifestyle changes and possibly taking medicine. Living a healthy lifestyle can help lower high blood pressure. You may need to change some of your habits. Lifestyle changes may include:  Following the DASH diet. This diet is high in fruits, vegetables, and whole grains. It is low in salt, red meat, and added sugars.  Getting at least 2 hours of brisk physical activity every week.  Losing weight if necessary.  Not smoking.  Limiting alcoholic beverages.  Learning ways to reduce stress. If lifestyle changes are not enough to get your blood pressure under control, your health care provider may prescribe medicine. You may need to take more than one. Work closely with your health care provider to understand the risks and benefits. HOME CARE INSTRUCTIONS  Have your blood pressure rechecked as directed by your health care provider.  Take medicines only as directed by your health care provider. Follow the directions carefully. Blood pressure medicines must be taken as prescribed. The medicine does not work as well when you skip doses. Skipping doses also puts you at risk for problems.   Do not smoke.   Monitor your blood pressure at home as directed by your health care provider. SEEK MEDICAL CARE IF:   You think you are having a reaction to medicines taken.  You have recurrent headaches or feel dizzy.  You have swelling in your ankles.  You have trouble with your vision. SEEK IMMEDIATE MEDICAL CARE IF:  You develop a severe headache or confusion.  You have unusual weakness, numbness, or feel faint.  You have severe chest or abdominal pain.  You  vomit repeatedly.  You have trouble breathing. MAKE SURE YOU:   Understand these instructions.  Will watch your condition.  Will get help right away if you are not doing well or get worse. Document Released: 05/05/2005 Document Revised: 09/19/2013 Document Reviewed: 02/25/2013 [facility] Patient Information 2015 [place], [place]. This information is not intended to replace advice given to you by your health care provider. Make sure you discuss any questions you have with your health care provider.

## 2014-08-07 NOTE — Progress Notes (Signed)
Patient ID: Todd Ferguson, male   DOB: 22-Jan-1941, 74 y.o.   MRN: [id number]   Todd Ferguson, is a 74 y.o. male  YBO:[id number]  [id number]:[id number]  DOB - 16-Sep-1940  Chief Complaint  Patient presents with  . Follow-up  . Hypertension        Subjective:   Todd Ferguson is a 74 y.o. male here today for a follow up visit. Patient has history of hypertension, prediabetes, hypertriglyceridemia and ongoing cigarette smoker. He presents today for routine follow-up of hypertension and medication management. Patient is compliant with medications, reports no side effects. He has no complaint today. He continues to smoke about 5-6 cigarettes per day. He denies any pain. He has not had a PSA or colonoscopy done in a long time. Patient has No headache, No chest pain, No abdominal pain - No Nausea, No new weakness tingling or numbness, No Cough - SOB.  Problem  Smoking  Prostate Cancer Screening  Colon Cancer Screening  Prediabetes    ALLERGIES: No Known Allergies  PAST MEDICAL HISTORY: Past Medical History  Diagnosis Date  . Hypertension     MEDICATIONS AT HOME: Prior to Admission medications   Medication Sig Start Date End Date Taking? Authorizing Provider  amLODipine (NORVASC) 10 MG tablet Take 1 tablet (10 mg total) by mouth daily. 01/19/14  Yes Tresa Garter, MD  hydrochlorothiazide (HYDRODIURIL) 25 MG tablet Take 1 tablet (25 mg total) by mouth daily. 01/19/14  Yes Tresa Garter, MD  lisinopril (PRINIVIL,ZESTRIL) 40 MG tablet Take 1 tablet (40 mg total) by mouth daily. 01/19/14  Yes Tresa Garter, MD  aspirin EC 81 MG tablet Take 1 tablet (81 mg total) by mouth daily. Patient not taking: Reported on 08/07/2014 05/10/13   Reyne Dumas, MD  nicotine (NICODERM CQ) 21 mg/24hr patch Place 1 patch (21 mg total) onto the skin daily. Patient not taking: Reported on 06/09/2014 07/11/13   Lorayne Marek, MD  Vitamin D, Ergocalciferol, (DRISDOL) 50000 UNITS CAPS capsule Take 1  capsule (50,000 Units total) by mouth every 7 (seven) days. Patient not taking: Reported on 06/09/2014 05/10/13   Reyne Dumas, MD     Objective:   Filed Vitals:   08/07/14 1507  BP: 158/81  Pulse: 90  Temp: 98 F (36.7 C)  TempSrc: Oral  Resp: 18  Height: 5\' 4"  (1.626 m)  Weight: 198 lb 9.6 oz (90.084 kg)  SpO2: 98%    Exam General appearance : Awake, alert, not in any distress. Speech Clear. Not toxic looking HEENT: Atraumatic and Normocephalic, pupils equally reactive to light and accomodation Neck: supple, no JVD. No cervical lymphadenopathy.  Chest:Good air entry bilaterally, no added sounds  CVS: S1 S2 regular, no murmurs.  Abdomen: Bowel sounds present, Non tender and not distended with no gaurding, rigidity or rebound. Extremities: B/L Lower Ext shows no edema, both legs are warm to touch Neurology: Awake alert, and oriented X 3, CN II-XII intact, Non focal Skin:No Rash  Data Review Lab Results  Component Value Date   HGBA1C 6.1* 10/28/2013     Assessment & Plan   1. Essential hypertension, benign  We have discussed target BP range and blood pressure goal. I have advised patient to check BP regularly and to call us back or report to clinic if the numbers are consistently higher than 140/90. We discussed the importance of compliance with medical therapy and DASH diet recommended, consequences of uncontrolled hypertension discussed.  - continue current BP medications  2. Hypertriglyceridemia  -  Lipid panel  To address this please limit saturated fat to no more than 7% of your calories, limit cholesterol to 200 mg/day, increase fiber and exercise as tolerated. If needed we may add another cholesterol lowering medication to your regimen.   3. Smoking  Todd Ferguson was counseled on the dangers of tobacco use, and was advised to quit. Reviewed strategies to maximize success, including removing cigarettes and smoking materials from environment, stress management and  support of family/friends.   4. Prediabetes  - POCT glycosylated hemoglobin (Hb A1C)  Aim for 30 minutes of exercise most days. Rethink what you drink. Water is great! Aim for 2-3 Carb Choices per meal (30-45 grams) +/- 1 either way  Aim for 0-15 Carbs per snack if hungry  Include protein in moderation with your meals and snacks  Consider reading food labels for Total Carbohydrate and Fat Grams of foods  Consider checking BG at alternate times per day  Continue taking medication as directed Be mindful about how much sugar you are adding to beverages and other foods. Fruit Punch - find one with no sugar  Measure and decrease portions of carbohydrate foods  Make your plate and don't go back for seconds   5. Colon cancer screening  - [facility] COLONOSCOPY - Ambulatory referral to Gastroenterology for Colonoscopy  6. Prostate cancer screening  - PSA, Medicare  Patient have been counseled extensively about nutrition and exercise Return in about 6 months (around 02/07/2015), or if symptoms worsen or fail to improve, for Hemoglobin A1C and Follow up, DM, Follow up HTN.  The patient was given clear instructions to go to ER or return to medical center if symptoms don't improve, worsen or new problems develop. The patient verbalized understanding. The patient was told to call to get lab results if they haven't heard anything in the next week.   This note has been created with Surveyor, quantity. Any transcriptional errors are unintentional.    Angelica Chessman, MD, [place], [place], [place], [place], [place]   08/07/2014, 3:54 PM

## 2014-08-08 LAB — PSA, MEDICARE: PSA: 1.96 ng/mL (ref <=4.00)

## 2014-08-08 LAB — LIPID PANEL
Cholesterol: 209 mg/dL — ABNORMAL HIGH (ref 0–200)
HDL: 49 mg/dL (ref >=40)
LDL CALC: 117 mg/dL — AB (ref 0–99)
Total CHOL/HDL Ratio: 4.3 Ratio
Triglycerides: 216 mg/dL — ABNORMAL HIGH (ref <150)
VLDL: 43 mg/dL — ABNORMAL HIGH (ref 0–40)

## 2014-08-22 ENCOUNTER — Telehealth: Payer: Self-pay

## 2014-08-22 NOTE — Telephone Encounter (Signed)
Patient not available left message with family member to return our call

## 2014-08-22 NOTE — Telephone Encounter (Signed)
-----   Message from Tresa Garter, MD sent at 08/18/2014  6:14 PM EDT ----- Please inform patient that his laboratory results shows normal prostate enzyme, but very high cholesterol level.To address this please limit saturated fat to no more than 7% of your calories, limit cholesterol to 200 mg/day, increase fiber and exercise as tolerated. If needed we may add another cholesterol lowering medication to your regimen.

## 2014-09-11 ENCOUNTER — Encounter: Payer: Self-pay | Admitting: Gastroenterology
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
## 2015-02-02 ENCOUNTER — Other Ambulatory Visit: Payer: Self-pay | Admitting: Internal Medicine

## 2015-02-02 NOTE — Telephone Encounter (Signed)
Patient called to request med refills on all of his current medications until he is able to come in for an OV. Please f/u with pt.

## 2015-03-05 ENCOUNTER — Ambulatory Visit (HOSPITAL_COMMUNITY)
Admission: RE | Admit: 2015-03-05 | Discharge: 2015-03-05 | Disposition: A | Discharge status: Home / Self Care | Payer: Medicare Other | Source: Ambulatory Visit | Attending: Internal Medicine | Admitting: Internal Medicine

## 2015-03-05 ENCOUNTER — Ambulatory Visit (HOSPITAL_BASED_OUTPATIENT_CLINIC_OR_DEPARTMENT_OTHER): Payer: Medicare Other | Admitting: Internal Medicine

## 2015-03-05 ENCOUNTER — Other Ambulatory Visit: Payer: Self-pay

## 2015-03-05 ENCOUNTER — Encounter: Payer: Self-pay | Admitting: Internal Medicine

## 2015-03-05 VITALS — BP 139/70 | HR 50 | Temp 98.2°F | Resp 18 | Ht 64.0 in | Wt 204.0 lb

## 2015-03-05 DIAGNOSIS — Z79899 Other long term (current) drug therapy: Secondary | ICD-10-CM | POA: Insufficient documentation

## 2015-03-05 DIAGNOSIS — I10 Essential (primary) hypertension: Secondary | ICD-10-CM | POA: Diagnosis not present

## 2015-03-05 DIAGNOSIS — Z23 Encounter for immunization: Secondary | ICD-10-CM | POA: Diagnosis not present

## 2015-03-05 DIAGNOSIS — R001 Bradycardia, unspecified: Secondary | ICD-10-CM | POA: Insufficient documentation

## 2015-03-05 DIAGNOSIS — Z0000 Encounter for general adult medical examination without abnormal findings: Secondary | ICD-10-CM

## 2015-03-05 DIAGNOSIS — R7303 Prediabetes: Secondary | ICD-10-CM | POA: Insufficient documentation

## 2015-03-05 DIAGNOSIS — Z720 Tobacco use: Secondary | ICD-10-CM

## 2015-03-05 DIAGNOSIS — F17210 Nicotine dependence, cigarettes, uncomplicated: Secondary | ICD-10-CM | POA: Insufficient documentation

## 2015-03-05 DIAGNOSIS — F17200 Nicotine dependence, unspecified, uncomplicated: Secondary | ICD-10-CM

## 2015-03-05 MED ORDER — HYDROCHLOROTHIAZIDE 25 MG PO TABS: 25.0000 mg | ORAL_TABLET | Freq: Every day | ORAL | Status: DC

## 2015-03-05 MED ORDER — LISINOPRIL 40 MG PO TABS: 40.0000 mg | ORAL_TABLET | Freq: Every day | ORAL | Status: DC

## 2015-03-05 MED ORDER — AMLODIPINE BESYLATE 10 MG PO TABS: 10.0000 mg | ORAL_TABLET | Freq: Every day | ORAL | Status: DC

## 2015-03-05 NOTE — Progress Notes (Signed)
Patient ID: Todd Ferguson, male   DOB: 1941-01-04, 74 y.o.   MRN: [id number]   Subjective:  Patient ID: Todd Ferguson, male    DOB: 1940/06/25  Age: 74 y.o. MRN: [id number]  CC: No chief complaint on file.  HPI Todd Ferguson presents for a follow up visit. Patient has history of hypertension, prediabetes, hypertriglyceridemia and ongoing cigarette smoker. He presents today for routine follow-up of hypertension and medication management and refill. Patient is compliant with medications, reports no side effects. He has no complaint today. He continues to smoke about 5 - 6 cigarettes per day. He denies any pain. He has no headache, no fever. 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 He denies chest pain. He denies being depressed.  Outpatient Prescriptions Prior to Visit  Medication Sig Dispense Refill  . aspirin EC 81 MG tablet Take 1 tablet (81 mg total) by mouth daily. 30 tablet 6  . amLODipine (NORVASC) 10 MG tablet Take 1 tablet (10 mg total) by mouth daily. 90 tablet 3  . hydrochlorothiazide (HYDRODIURIL) 25 MG tablet Take 1 tablet (25 mg total) by mouth daily. 90 tablet 3  . lisinopril (PRINIVIL,ZESTRIL) 40 MG tablet Take 1 tablet (40 mg total) by mouth daily. 90 tablet 3  . nicotine (NICODERM CQ) 21 mg/24hr patch Place 1 patch (21 mg total) onto the skin daily. (Patient not taking: Reported on 06/09/2014) 28 patch 0  . Vitamin D, Ergocalciferol, (DRISDOL) 50000 UNITS CAPS capsule Take 1 capsule (50,000 Units total) by mouth every 7 (seven) days. (Patient not taking: Reported on 06/09/2014) 30 capsule 6   No facility-administered medications prior to visit.   ROS Review of Systems  Constitutional: Negative for activity change, appetite change and fatigue.  Eyes:       Blind right eye  Respiratory: Negative for apnea, cough, choking, chest tightness and shortness of breath.   Cardiovascular:  Negative for chest pain, palpitations and leg swelling.  Gastrointestinal: Negative for abdominal pain, blood in stool, abdominal distention and anal bleeding.  Endocrine: Negative for cold intolerance and heat intolerance.  Musculoskeletal: Negative for back pain, joint swelling, arthralgias and gait problem.  Neurological: Negative for dizziness, facial asymmetry, light-headedness and headaches.  Psychiatric/Behavioral: Negative.    Objective:  BP 139/70 mmHg  Pulse 50  Temp(Src) 98.2 F (36.8 C) (Oral)  Resp 18  Ht 5\' 4"  (1.626 m)  Wt 204 lb (92.534 kg)  BMI 35.00 kg/m2  SpO2 96%  BP/Weight 03/05/2015 08/07/2014 01/14/9370  Systolic BP 696 789 381  Diastolic BP 70 81 71  Wt. (Lbs) 204 198.6 -  BMI 35 34.07 -   Hypertension  Todd Ferguson is compliance with low sodium diet.  He is compliant with medications. He is compliant with exercise. Exercise: Walking  Lab Data BMP Latest Ref Rng 06/09/2014 10/28/2013 04/29/2013  Glucose 70 - 99 mg/dL 99 102(H) 83  BUN 6 - 23 mg/dL 10 16 7   Creatinine 0.50 - 1.35 mg/dL 1.19 1.17 0.99  Sodium 135 - 145 mmol/L 133(L) 136 137  Potassium 3.5 - 5.1 mmol/L 3.5 4.6 4.9  Chloride 96 - 112 mEq/L 104 103 101  CO2 19 - 32 mmol/L 23 21 28   Calcium 8.4 - 10.5 mg/dL 8.5 9.8 9.6   Physical Exam  Constitutional: He is oriented to person, place, and time. He appears well-developed and well-nourished. No distress.  HENT:  Head: Normocephalic  and atraumatic.  Eyes: Right eye exhibits no discharge. Left eye exhibits no discharge. No scleral icterus.  Artificial right eye, blind  Neck: Normal range of motion. Neck supple.  Cardiovascular: Exam reveals no friction rub.   No murmur heard. Bradycardia, irregular   Pulmonary/Chest: Effort normal and breath sounds normal. No respiratory distress. He has no wheezes.  Abdominal: Soft. Bowel sounds are normal. He exhibits no distension. There is no tenderness. There is no rebound.  Musculoskeletal: Normal  range of motion. He exhibits no edema or tenderness.  Neurological: He is alert and oriented to person, place, and time. He has normal reflexes.  Skin: Skin is warm and dry. He is not diaphoretic.  Psychiatric: He has a normal mood and affect. His behavior is normal.   Assessment & Plan:   1. Prediabetes  Aim for 30 minutes of exercise most days. Rethink what you drink. Water is great! Aim for 2-3 Carb Choices per meal (30-45 grams) +/- 1 either way  Aim for 0-15 Carbs per snack if hungry  Include protein in moderation with your meals and snacks  Consider reading food labels for Total Carbohydrate and Fat Grams of foods  Be mindful about how much sugar you are adding to beverages and other foods. Fruit Punch - find one with no sugar  Measure and decrease portions of carbohydrate foods  Make your plate and don't go back for seconds   2. Essential hypertension, benign Refill - amLODipine (NORVASC) 10 MG tablet; Take 1 tablet (10 mg total) by mouth daily.  Dispense: 90 tablet; Refill: 3 - hydrochlorothiazide (HYDRODIURIL) 25 MG tablet; Take 1 tablet (25 mg total) by mouth daily.  Dispense: 90 tablet; Refill: 3 - lisinopril (PRINIVIL,ZESTRIL) 40 MG tablet; Take 1 tablet (40 mg total) by mouth daily.  Dispense: 90 tablet; Refill: 3  We have discussed target BP range and blood pressure goal. I have advised patient to check BP regularly and to call us back or report to clinic if the numbers are consistently higher than 140/90. We discussed the importance of compliance with medical therapy and DASH diet recommended, consequences of uncontrolled hypertension discussed.   - continue current BP medications  3. Smoking  Todd Ferguson was counseled on the dangers of tobacco use, and was advised to quit. Reviewed strategies to maximize success, including removing cigarettes and smoking materials from environment, stress management and support of family/friends.  EKG done today read by me, shows  marked bradycardia with PACs, nonspecific ST-T wave changes. We'll order echocardiogram to evaluate the structural integrity of the heart.  Meds ordered this encounter  Medications  . amLODipine (NORVASC) 10 MG tablet    Sig: Take 1 tablet (10 mg total) by mouth daily.    Dispense:  90 tablet    Refill:  3  . hydrochlorothiazide (HYDRODIURIL) 25 MG tablet    Sig: Take 1 tablet (25 mg total) by mouth daily.    Dispense:  90 tablet    Refill:  3  . lisinopril (PRINIVIL,ZESTRIL) 40 MG tablet    Sig: Take 1 tablet (40 mg total) by mouth daily.    Dispense:  90 tablet    Refill:  3    Follow-up: Return in about 6 months (around 09/03/2015) for Routine Follow Up, Follow up HTN.    Angelica Chessman, MD, [place], [place], [place], [place] and [place], [place]   03/05/2015, 10:38 AM

## 2015-03-05 NOTE — Progress Notes (Signed)
Patient here for HTN F/U.  [facility] need refills on amlodipine lisinopril, HCTZ.  Patient denies pain at this time.

## 2015-03-05 NOTE — Patient Instructions (Signed)
DASH Eating Plan °DASH stands for "Dietary Approaches to Stop Hypertension." The DASH eating plan is a healthy eating plan that has been shown to reduce high blood pressure (hypertension). Additional health benefits may include reducing the risk of type 2 diabetes mellitus, heart disease, and stroke. The DASH eating plan may also help with weight loss. °WHAT DO I NEED TO KNOW ABOUT THE DASH EATING PLAN? °For the DASH eating plan, you will follow these general guidelines: °· Choose foods with a percent daily value for sodium of less than 5% (as listed on the food label). °· Use salt-free seasonings or herbs instead of table salt or sea salt. °· Check with your health care provider or pharmacist before using salt substitutes. °· Eat lower-sodium products, often labeled as "lower sodium" or "no salt added." °· Eat fresh foods. °· Eat more vegetables, fruits, and low-fat dairy products. °· Choose whole grains. Look for the word "whole" as the first word in the ingredient list. °· Choose fish and skinless chicken or turkey more often than red meat. Limit fish, poultry, and meat to 6 oz (170 g) each day. °· Limit sweets, desserts, sugars, and sugary drinks. °· Choose heart-healthy fats. °· Limit cheese to 1 oz (28 g) per day. °· Eat more home-cooked food and less restaurant, buffet, and fast food. °· Limit fried foods. °· Cook foods using methods other than frying. °· Limit canned vegetables. If you do use them, rinse them well to decrease the sodium. °· When eating at a restaurant, ask that your food be prepared with less salt, or no salt if possible. °WHAT FOODS CAN I EAT? °Seek help from a dietitian for individual calorie needs. °Grains °Whole grain or whole wheat bread. Brown rice. Whole grain or whole wheat pasta. Quinoa, bulgur, and whole grain cereals. Low-sodium cereals. Corn or whole wheat flour tortillas. Whole grain cornbread. Whole grain crackers. Low-sodium crackers. °Vegetables °Fresh or frozen vegetables  (raw, steamed, roasted, or grilled). Low-sodium or reduced-sodium tomato and vegetable juices. Low-sodium or reduced-sodium tomato sauce and paste. Low-sodium or reduced-sodium canned vegetables.  °Fruits °All fresh, canned (in natural juice), or frozen fruits. °Meat and Other Protein Products °Ground beef (85% or leaner), grass-fed beef, or beef trimmed of fat. Skinless chicken or turkey. Ground chicken or turkey. Pork trimmed of fat. All fish and seafood. Eggs. Dried beans, peas, or lentils. Unsalted nuts and seeds. Unsalted canned beans. °Dairy °Low-fat dairy products, such as skim or 1% milk, 2% or reduced-fat cheeses, low-fat ricotta or cottage cheese, or plain low-fat yogurt. Low-sodium or reduced-sodium cheeses. °Fats and Oils °Tub margarines without trans fats. Light or reduced-fat mayonnaise and salad dressings (reduced sodium). Avocado. Safflower, olive, or canola oils. Natural peanut or almond butter. °Other °Unsalted popcorn and pretzels. °The items listed above may not be a complete list of recommended foods or beverages. Contact your dietitian for more options. °WHAT FOODS ARE NOT RECOMMENDED? °Grains °White bread. White pasta. White rice. Refined cornbread. Bagels and croissants. Crackers that contain trans fat. °Vegetables °Creamed or fried vegetables. Vegetables in a cheese sauce. Regular canned vegetables. Regular canned tomato sauce and paste. Regular tomato and vegetable juices. °Fruits °Dried fruits. Canned fruit in light or heavy syrup. Fruit juice. °Meat and Other Protein Products °Fatty cuts of meat. Ribs, chicken wings, bacon, sausage, bologna, salami, chitterlings, fatback, hot dogs, bratwurst, and packaged luncheon meats. Salted nuts and seeds. Canned beans with salt. °Dairy °Whole or 2% milk, cream, half-and-half, and cream cheese. Whole-fat or sweetened yogurt. Full-fat   cheeses or blue cheese. Nondairy creamers and whipped toppings. Processed cheese, cheese spreads, or cheese  curds. °Condiments °Onion and garlic salt, seasoned salt, table salt, and sea salt. Canned and packaged gravies. Worcestershire sauce. Tartar sauce. Barbecue sauce. Teriyaki sauce. Soy sauce, including reduced sodium. Steak sauce. Fish sauce. Oyster sauce. Cocktail sauce. Horseradish. Ketchup and mustard. Meat flavorings and tenderizers. Bouillon cubes. Hot sauce. Tabasco sauce. Marinades. Taco seasonings. Relishes. °Fats and Oils °Butter, stick margarine, lard, shortening, ghee, and bacon fat. Coconut, palm kernel, or palm oils. Regular salad dressings. °Other °Pickles and olives. Salted popcorn and pretzels. °The items listed above may not be a complete list of foods and beverages to avoid. Contact your dietitian for more information. °WHERE CAN I FIND MORE INFORMATION? °National Heart, [employer], [employer] Institute: www.nhlbi.nih.gov/health/health-topics/topics/dash/ °  °This information is not intended to replace advice given to you by your health care provider. Make sure you discuss any questions you have with your health care provider. °  °Document Released: 04/24/2011 Document Revised: 05/26/2014 Document Reviewed: 03/09/2013 °Elsevier Interactive Patient Education ©2016 [employer] Inc[employer] ° °Hypertension °Hypertension, commonly called high blood pressure, is when the force of blood pumping through your arteries is too strong. Your arteries are the blood vessels that carry blood from your heart throughout your body. A blood pressure reading consists of a higher number over a lower number, such as 110/72. The higher number (systolic) is the pressure inside your arteries when your heart pumps. The lower number (diastolic) is the pressure inside your arteries when your heart relaxes. Ideally you want your blood pressure below 120/80. °Hypertension forces your heart to work harder to pump blood. Your arteries may become narrow or stiff. Having untreated or uncontrolled hypertension can cause heart attack, stroke, kidney  disease, and other problems. °RISK FACTORS °Some risk factors for high blood pressure are controllable. Others are not.  °Risk factors you cannot control include:  °· Race. You may be at higher risk if you are African American. °· Age. Risk increases with age. °· Gender. Men are at higher risk than women before age 45 years. After age 65, women are at higher risk than men. °Risk factors you can control include: °· Not getting enough exercise or physical activity. °· Being overweight. °· Getting too much fat, sugar, calories, or salt in your diet. °· Drinking too much alcohol. °SIGNS AND SYMPTOMS °Hypertension does not usually cause signs or symptoms. Extremely high blood pressure (hypertensive crisis) may cause headache, anxiety, shortness of breath, and nosebleed. °DIAGNOSIS °To check if you have hypertension, your health care provider will measure your blood pressure while you are seated, with your arm held at the level of your heart. It should be measured at least twice using the same arm. Certain conditions can cause a difference in blood pressure between your right and left arms. A blood pressure reading that is higher than normal on one occasion does not mean that you need treatment. If it is not clear whether you have high blood pressure, you may be asked to return on a different day to have your blood pressure checked again. Or, you may be asked to monitor your blood pressure at home for 1 or more weeks. °TREATMENT °Treating high blood pressure includes making lifestyle changes and possibly taking medicine. Living a healthy lifestyle can help lower high blood pressure. You may need to change some of your habits. °Lifestyle changes may include: °· Following the DASH diet. This diet is high in fruits, vegetables, and whole   grains. It is low in salt, red meat, and added sugars. °· Keep your sodium intake below 2,300 mg per day. °· Getting at least 30-45 minutes of aerobic exercise at least 4 times per  week. °· Losing weight if necessary. °· Not smoking. °· Limiting alcoholic beverages. °· Learning ways to reduce stress. °Your health care provider may prescribe medicine if lifestyle changes are not enough to get your blood pressure under control, and if one of the following is true: °· You are 18-59 years of age and your systolic blood pressure is above 140. °· You are 60 years of age or older, and your systolic blood pressure is above 150. °· Your diastolic blood pressure is above 90. °· You have diabetes, and your systolic blood pressure is over 140 or your diastolic blood pressure is over 90. °· You have kidney disease and your blood pressure is above 140/90. °· You have heart disease and your blood pressure is above 140/90. °Your personal target blood pressure may vary depending on your medical conditions, your age, and other factors. °HOME CARE INSTRUCTIONS °· Have your blood pressure rechecked as directed by your health care provider.   °· Take medicines only as directed by your health care provider. Follow the directions carefully. Blood pressure medicines must be taken as prescribed. The medicine does not work as well when you skip doses. Skipping doses also puts you at risk for problems. °· Do not smoke.   °· Monitor your blood pressure at home as directed by your health care provider.  °SEEK MEDICAL CARE IF:  °· You think you are having a reaction to medicines taken. °· You have recurrent headaches or feel dizzy. °· You have swelling in your ankles. °· You have trouble with your vision. °SEEK IMMEDIATE MEDICAL CARE IF: °· You develop a severe headache or confusion. °· You have unusual weakness, numbness, or feel faint. °· You have severe chest or abdominal pain. °· You vomit repeatedly. °· You have trouble breathing. °MAKE SURE YOU:  °· Understand these instructions. °· Will watch your condition. °· Will get help right away if you are not doing well or get worse. °  °This information is not intended to  replace advice given to you by your health care provider. Make sure you discuss any questions you have with your health care provider. °  °Document Released: 05/05/2005 Document Revised: 09/19/2014 Document Reviewed: 02/25/2013 °Elsevier Interactive Patient Education ©2016 Elsevier Inc. ° °

## 2015-03-08 ENCOUNTER — Ambulatory Visit (HOSPITAL_COMMUNITY)
Admission: RE | Admit: 2015-03-08 | Discharge: 2015-03-08 | Disposition: A | Discharge status: Home / Self Care | Payer: Medicare Other | Source: Ambulatory Visit | Attending: Internal Medicine | Admitting: Internal Medicine

## 2015-03-08 DIAGNOSIS — I517 Cardiomegaly: Secondary | ICD-10-CM | POA: Insufficient documentation

## 2015-03-08 DIAGNOSIS — R012 Other cardiac sounds: Secondary | ICD-10-CM | POA: Insufficient documentation

## 2015-03-08 DIAGNOSIS — R001 Bradycardia, unspecified: Secondary | ICD-10-CM

## 2015-03-08 DIAGNOSIS — I119 Hypertensive heart disease without heart failure: Secondary | ICD-10-CM | POA: Diagnosis not present

## 2015-03-08 DIAGNOSIS — I10 Essential (primary) hypertension: Secondary | ICD-10-CM | POA: Insufficient documentation

## 2015-03-08 NOTE — Progress Notes (Signed)
  Echocardiogram 2D Echocardiogram has been performed.  Diamond Nickel 03/08/2015, 3:58 PM

## 2015-04-25 ENCOUNTER — Telehealth: Payer: Self-pay | Admitting: *Deleted

## 2015-04-25 NOTE — Telephone Encounter (Signed)
-----   Message from Tresa Garter, MD sent at 03/14/2015 10:59 AM EDT ----- Please inform patient that his heart ultrasound is normal.

## 2015-04-25 NOTE — Telephone Encounter (Signed)
Patient verified DOB Patient informed of his heart ultrasound being normal. Patient asked when his next appointment was. Medical Assistant informed patient of no future appointment being scheduled. Medical Assistant looked at last OV AVS (03/05/15). Patient was advised to return in 6 months for FU (09/02/15) Patient advised to call in March to schedule appointment for beginning of April. Patient expressed his understanding and had no further questions.

## 2015-07-25 ENCOUNTER — Ambulatory Visit: Payer: Medicare Other | Attending: Family Medicine | Admitting: Family Medicine

## 2015-07-25 VITALS — BP 162/78 | HR 80 | Temp 97.9°F | Resp 18 | Ht 64.0 in | Wt 198.4 lb

## 2015-07-25 DIAGNOSIS — I10 Essential (primary) hypertension: Secondary | ICD-10-CM

## 2015-07-25 DIAGNOSIS — M10071 Idiopathic gout, right ankle and foot: Secondary | ICD-10-CM

## 2015-07-25 DIAGNOSIS — M109 Gout, unspecified: Secondary | ICD-10-CM | POA: Insufficient documentation

## 2015-07-25 DIAGNOSIS — M25571 Pain in right ankle and joints of right foot: Secondary | ICD-10-CM | POA: Diagnosis not present

## 2015-07-25 DIAGNOSIS — R7303 Prediabetes: Secondary | ICD-10-CM | POA: Diagnosis not present

## 2015-07-25 LAB — COMPREHENSIVE METABOLIC PANEL
ALT: 11 U/L (ref 9–46)
AST: 16 U/L (ref 10–35)
Albumin: 4.4 g/dL (ref 3.6–5.1)
Alkaline Phosphatase: 52 U/L (ref 40–115)
BILIRUBIN TOTAL: 0.5 mg/dL (ref 0.2–1.2)
BUN: 11 mg/dL (ref 7–25)
CALCIUM: 9.8 mg/dL (ref 8.6–10.3)
CO2: 25 mmol/L (ref 20–31)
Chloride: 101 mmol/L (ref 98–110)
Creat: 1.14 mg/dL (ref 0.70–1.18)
Glucose, Bld: 102 mg/dL — ABNORMAL HIGH (ref 65–99)
POTASSIUM: 4.3 mmol/L (ref 3.5–5.3)
Sodium: 137 mmol/L (ref 135–146)
Total Protein: 7.6 g/dL (ref 6.1–8.1)

## 2015-07-25 LAB — POCT GLYCOSYLATED HEMOGLOBIN (HGB A1C): Hemoglobin A1C: 5.9

## 2015-07-25 LAB — URIC ACID: Uric Acid, Serum: 7.5 mg/dL (ref 4.0–7.8)

## 2015-07-25 LAB — GLUCOSE, POCT (MANUAL RESULT ENTRY): POC Glucose: 141 mg/dl — AB (ref 70–99)

## 2015-07-25 MED ORDER — INDOMETHACIN 50 MG PO CAPS: 50.0000 mg | ORAL_CAPSULE | Freq: Three times a day (TID) | ORAL | Status: DC

## 2015-07-25 MED FILL — ?INDOMETHACIN 50 MG CAPSULE: 50 | 15 days supply | Qty: 45 | Fill #0

## 2015-07-25 NOTE — Patient Instructions (Signed)
It is a pleasure to see you today.  I believe the pain and swelling in your right foot is from a GOUT ATTACK.   I have sent a prescription for a medicine, INDOMETHACIN 50mg  capsules, take 1 capsule by mouth every 8 hours with something to eat, until the pain subsides.  If it is not improved in the next few days, I would like you to be seen again here next week.   Labs today.   Continue your other medications as you are taking them.

## 2015-07-25 NOTE — Progress Notes (Signed)
Patient is here for R foot swelling.  Patient complains of swelling around the big toe and top of foot. Symptom has been present for 2 weeks with tenderness with exertion. Patient has used Bengay with no relief. Patient also took ibuprofen for pain.  Patient has taken medications this morning.

## 2015-07-25 NOTE — Progress Notes (Signed)
   Subjective:    Patient ID: Todd Ferguson, male    DOB: [birth year], 75 y.o.   MRN: [id number]  HPI Patient complains of six days of pain in the R great toe, came on during the day and was bad when he went to Bible study that evening Raynelle Dick, March 2nd). No trauma, no fevers or chills.  Has taken 2 ibuprofen tablets in the course of the week, ran out.  No other meds to alleviate the pain.  Has never had this before.   Previously he drank beer, stopped almost a year ago. Still drinks wine on occasion.  ROS: no chest pain, no cough or dyspnea, no abd pain, no fevers or chills. Is able to bear weight with pain.    Review of Systems     Objective:   Physical Exam Well appearing, no distress.  HEENT Neck supple, no adenopathy. Moist mucus membranes.  COR Regular S1S2 PULM Clear bilaterally, no rales or wheezes. EXTS: R great toe with erythema at MTP joint, as well as edema and tenderness with palpation. Palpable dp pulses in both feet.  Sensation in distal R great toe is intact (similar to other toes of both feet). Able to flex/extend R great toe. Able to bear weight.  Full active ROM both ankles, without edema bilaterally. No pretibial edema bilaterally        Assessment & Plan:   Gouty attack, R great toe: Indocin 50mg  tid; labs including uric acid and CMet.   If uric acid is not elevated (as is often the case during acute flares), then follow up for labs in about 1 month (to recheck uric acid level).   HTN: Continue current medications, in setting of acute gout flare.   Dalbert Mayotte, MD

## 2015-08-02 ENCOUNTER — Telehealth: Payer: Self-pay | Admitting: *Deleted

## 2015-08-02 NOTE — Telephone Encounter (Signed)
-----   Message from Willeen Niece, MD sent at 07/26/2015  8:58 AM EST ----- Please let patient know his kidney function is normal; the uric acid is not particularly elevated, but this can be the case during an acute gouty flare. Needs follow up in about a month and would recommend rechecking the uric acid level at that time.  Continue with current medicines for blood pressure and gout.  JB

## 2015-08-02 NOTE — Telephone Encounter (Signed)
Patient verified DOB Patient made aware of kidney function being normal. Patient informed of uric acid level not being particularly elevated which can be the case with an acute attack. Patient advised to schedule April FU with PCP and to have uric acid level rechecked. Patient advised to continue with current medications for BP and gout. Patient expressed his understanding and stated he would call next week to schedule his April appointment. Patient had no further questions at this time.

## 2015-09-06 ENCOUNTER — Encounter: Payer: Self-pay | Admitting: Internal Medicine

## 2015-09-06 ENCOUNTER — Ambulatory Visit: Payer: Medicare Other | Attending: Internal Medicine | Admitting: Internal Medicine

## 2015-09-06 VITALS — BP 154/75 | HR 60 | Temp 98.0°F | Resp 16 | Ht 64.0 in | Wt 201.8 lb

## 2015-09-06 DIAGNOSIS — Z79899 Other long term (current) drug therapy: Secondary | ICD-10-CM | POA: Diagnosis not present

## 2015-09-06 DIAGNOSIS — R7303 Prediabetes: Secondary | ICD-10-CM | POA: Insufficient documentation

## 2015-09-06 DIAGNOSIS — Z7982 Long term (current) use of aspirin: Secondary | ICD-10-CM | POA: Insufficient documentation

## 2015-09-06 DIAGNOSIS — Z720 Tobacco use: Secondary | ICD-10-CM

## 2015-09-06 DIAGNOSIS — F17210 Nicotine dependence, cigarettes, uncomplicated: Secondary | ICD-10-CM | POA: Insufficient documentation

## 2015-09-06 DIAGNOSIS — E785 Hyperlipidemia, unspecified: Secondary | ICD-10-CM | POA: Diagnosis not present

## 2015-09-06 DIAGNOSIS — I10 Essential (primary) hypertension: Secondary | ICD-10-CM | POA: Diagnosis not present

## 2015-09-06 DIAGNOSIS — Z1211 Encounter for screening for malignant neoplasm of colon: Secondary | ICD-10-CM | POA: Diagnosis not present

## 2015-09-06 DIAGNOSIS — F17200 Nicotine dependence, unspecified, uncomplicated: Secondary | ICD-10-CM

## 2015-09-06 MED ORDER — AMLODIPINE BESYLATE 10 MG PO TABS: 10.0000 mg | ORAL_TABLET | Freq: Every day | ORAL | Status: DC

## 2015-09-06 MED ORDER — LISINOPRIL 40 MG PO TABS: 40.0000 mg | ORAL_TABLET | Freq: Every day | ORAL | Status: DC

## 2015-09-06 MED ORDER — HYDROCHLOROTHIAZIDE 25 MG PO TABS: 25.0000 mg | ORAL_TABLET | Freq: Every day | ORAL | Status: DC

## 2015-09-06 NOTE — Progress Notes (Signed)
Patient's here for f/up R foot pain. Patient rate pain 1/10.  Patient requesting med refills.

## 2015-09-06 NOTE — Progress Notes (Signed)
Patient ID: Todd Ferguson, male   DOB: 09/26/1940, 75 y.o.   MRN: [id number]   Todd Ferguson, is a 75 y.o. male  [id number]  [id number]  DOB - 27-Aug-1940  Chief Complaint  Patient presents with  . Follow-up  . Hypertension        Subjective:   Todd Ferguson is a 75 y.o. male with hypertension, recently seen here and treated for right great toe pain due to possible gout here today for a follow up visit. Right great toe pain has resolved, swelling has resolved. Patient has no new complaints today. He desires refill of his medications. His other medical conditions include hyperlipidemia and prediabetes. Patient also has artificial right eye, blind. No history of dizziness. Patient continues to smoke cigarettes about 5-6 cigarettes per day, says he is cutting down with the numbers. Patient has No headache, No chest pain, No abdominal pain - No Nausea, No new weakness tingling or numbness, No Cough - SOB. Patient is due for colonoscopy.  No problems updated.  ALLERGIES: No Known Allergies  PAST MEDICAL HISTORY: Past Medical History  Diagnosis Date  . Hypertension     MEDICATIONS AT HOME: Prior to Admission medications   Medication Sig Start Date End Date Taking? Authorizing Provider  amLODipine (NORVASC) 10 MG tablet Take 1 tablet (10 mg total) by mouth daily. 09/06/15  Yes Tresa Garter, MD  aspirin EC 81 MG tablet Take 1 tablet (81 mg total) by mouth daily. 05/10/13  Yes Reyne Dumas, MD  hydrochlorothiazide (HYDRODIURIL) 25 MG tablet Take 1 tablet (25 mg total) by mouth daily. 09/06/15  Yes Tresa Garter, MD  indomethacin (INDOCIN) 50 MG capsule Take 1 capsule (50 mg total) by mouth 3 (three) times daily with meals. 07/25/15  Yes Willeen Niece, MD  lisinopril (PRINIVIL,ZESTRIL) 40 MG tablet Take 1 tablet (40 mg total) by mouth daily. 09/06/15  Yes Candon Caras Essie Christine, MD  nicotine (NICODERM CQ) 21 mg/24hr patch Place 1 patch (21 mg total) onto the skin  daily. Patient not taking: Reported on 06/09/2014 07/11/13   Lorayne Marek, MD  Vitamin D, Ergocalciferol, (DRISDOL) 50000 UNITS CAPS capsule Take 1 capsule (50,000 Units total) by mouth every 7 (seven) days. Patient not taking: Reported on 06/09/2014 05/10/13   Reyne Dumas, MD     Objective:   Filed Vitals:   09/06/15 1448  BP: 154/75  Pulse: 60  Temp: 98 F (36.7 C)  TempSrc: Oral  Resp: 16  Height: 5\' 4"  (1.626 m)  Weight: 201 lb 12.8 oz (91.536 kg)  SpO2: 96%    Exam General appearance : Awake, alert, not in any distress. Speech Clear. Not toxic looking HEENT: Blind and discharging right eye-Chronic. Atraumatic and Normocephalic, pupils equally reactive to light and accomodation Neck: supple, no JVD. No cervical lymphadenopathy.  Chest:Good air entry bilaterally, no added sounds  CVS: S1 S2 regular, no murmurs.  Abdomen: Bowel sounds present, Non tender and not distended with no gaurding, rigidity or rebound. Extremities: B/L Lower Ext shows no edema, both legs are warm to touch Neurology: Awake alert, and oriented X 3, CN II-XII intact, Non focal  Data Review Lab Results  Component Value Date   HGBA1C 5.9 07/25/2015   HGBA1C 6.4 08/07/2014   HGBA1C 6.1* 10/28/2013     Assessment & Plan   1. Essential hypertension, benign  - lisinopril (PRINIVIL,ZESTRIL) 40 MG tablet; Take 1 tablet (40 mg total) by mouth daily.  Dispense: 90 tablet; Refill: 3 - hydrochlorothiazide (HYDRODIURIL)  25 MG tablet; Take 1 tablet (25 mg total) by mouth daily.  Dispense: 90 tablet; Refill: 3 - amLODipine (NORVASC) 10 MG tablet; Take 1 tablet (10 mg total) by mouth daily.  Dispense: 90 tablet; Refill: 3  We have discussed target BP range and blood pressure goal. I have advised patient to check BP regularly and to call us back or report to clinic if the numbers are consistently higher than 140/90. We discussed the importance of compliance with medical therapy and DASH diet recommended,  consequences of uncontrolled hypertension discussed.   - continue current BP medications  2. Smoking  [name] was counseled on the dangers of tobacco use, and was advised to quit. Reviewed strategies to maximize success, including removing cigarettes and smoking materials from environment, stress management and support of family/friends.  3. Colon cancer screening  - Ambulatory referral to Gastroenterology  Patient have been counseled extensively about nutrition and exercise  Return in about 3 months (around 12/06/2015) for Follow up HTN, Follow up Pain and comorbidities.  The patient was given clear instructions to go to ER or return to medical center if symptoms don't improve, worsen or new problems develop. The patient verbalized understanding. The patient was told to call to get lab results if they haven't heard anything in the next week.   This note has been created with Surveyor, quantity. Any transcriptional errors are unintentional.    Angelica Chessman, MD, [place], [place], [place], [place] and [place], [place]   09/06/2015, 3:15 PM

## 2015-09-06 NOTE — Patient Instructions (Signed)
Smoking Cessation, Tips for Success If you are ready to quit smoking, congratulations! You have chosen to help yourself be healthier. Cigarettes bring nicotine, tar, carbon monoxide, and other irritants into your body. Your lungs, heart, and blood vessels will be able to work better without these poisons. There are many different ways to quit smoking. Nicotine gum, nicotine patches, a nicotine inhaler, or nicotine nasal spray can help with physical craving. Hypnosis, support groups, and medicines help break the habit of smoking. WHAT THINGS CAN I DO TO MAKE QUITTING EASIER?  Here are some tips to help you quit for good:  Pick a date when you will quit smoking completely. Tell all of your friends and family about your plan to quit on that date.  Do not try to slowly cut down on the number of cigarettes you are smoking. Pick a quit date and quit smoking completely starting on that day.  Throw away all cigarettes.   Clean and remove all ashtrays from your home, work, and car.  On a card, write down your reasons for quitting. Carry the card with you and read it when you get the urge to smoke.  Cleanse your body of nicotine. Drink enough water and fluids to keep your urine clear or pale yellow. Do this after quitting to flush the nicotine from your body.  Learn to predict your moods. Do not let a bad situation be your excuse to have a cigarette. Some situations in your life might tempt you into wanting a cigarette.  Never have "just one" cigarette. It leads to wanting another and another. Remind yourself of your decision to quit.  Change habits associated with smoking. If you smoked while driving or when feeling stressed, try other activities to replace smoking. Stand up when drinking your coffee. Brush your teeth after eating. Sit in a different chair when you read the paper. Avoid alcohol while trying to quit, and try to drink fewer caffeinated beverages. Alcohol and caffeine may urge you to  smoke.  Avoid foods and drinks that can trigger a desire to smoke, such as sugary or spicy foods and alcohol.  Ask people who smoke not to smoke around you.  Have something planned to do right after eating or having a cup of coffee. For example, plan to take a walk or exercise.  Try a relaxation exercise to calm you down and decrease your stress. Remember, you may be tense and nervous for the first 2 weeks after you quit, but this will pass.  Find new activities to keep your hands busy. Play with a pen, coin, or rubber band. Doodle or draw things on paper.  Brush your teeth right after eating. This will help cut down on the craving for the taste of tobacco after meals. You can also try mouthwash.   Use oral substitutes in place of cigarettes. Try using lemon drops, carrots, cinnamon sticks, or chewing gum. Keep them handy so they are available when you have the urge to smoke.  When you have the urge to smoke, try deep breathing.  Designate your home as a nonsmoking area.  If you are a heavy smoker, ask your health care provider about a prescription for nicotine chewing gum. It can ease your withdrawal from nicotine.  Reward yourself. Set aside the cigarette money you save and buy yourself something nice.  Look for support from others. Join a support group or smoking cessation program. Ask someone at home or at work to help you with your plan   to quit smoking.  Always ask yourself, "Do I need this cigarette or is this just a reflex?" Tell yourself, "Today, I choose not to smoke," or "I do not want to smoke." You are reminding yourself of your decision to quit.  Do not replace cigarette smoking with electronic cigarettes (commonly called e-cigarettes). The safety of e-cigarettes is unknown, and some may contain harmful chemicals.  If you relapse, do not give up! Plan ahead and think about what you will do the next time you get the urge to smoke. HOW WILL I FEEL WHEN I QUIT SMOKING? You  may have symptoms of withdrawal because your body is used to nicotine (the addictive substance in cigarettes). You may crave cigarettes, be irritable, feel very hungry, cough often, get headaches, or have difficulty concentrating. The withdrawal symptoms are only temporary. They are strongest when you first quit but will go away within 10-14 days. When withdrawal symptoms occur, stay in control. Think about your reasons for quitting. Remind yourself that these are signs that your body is healing and getting used to being without cigarettes. Remember that withdrawal symptoms are easier to treat than the major diseases that smoking can cause.  Even after the withdrawal is over, expect periodic urges to smoke. However, these cravings are generally short lived and will go away whether you smoke or not. Do not smoke! WHAT RESOURCES ARE AVAILABLE TO HELP ME QUIT SMOKING? Your health care provider can direct you to community resources or hospitals for support, which may include:  Group support.  Education.  Hypnosis.  Therapy.   This information is not intended to replace advice given to you by your health care provider. Make sure you discuss any questions you have with your health care provider.   Document Released: 02/01/2004 Document Revised: 05/26/2014 Document Reviewed: 10/21/2012 Elsevier Interactive Patient Education 2016 Elsevier Inc. Hypertension Hypertension, commonly called high blood pressure, is when the force of blood pumping through your arteries is too strong. Your arteries are the blood vessels that carry blood from your heart throughout your body. A blood pressure reading consists of a higher number over a lower number, such as 110/72. The higher number (systolic) is the pressure inside your arteries when your heart pumps. The lower number (diastolic) is the pressure inside your arteries when your heart relaxes. Ideally you want your blood pressure below 120/80. Hypertension forces  your heart to work harder to pump blood. Your arteries may become narrow or stiff. Having untreated or uncontrolled hypertension can cause heart attack, stroke, kidney disease, and other problems. RISK FACTORS Some risk factors for high blood pressure are controllable. Others are not.  Risk factors you cannot control include:   Race. You may be at higher risk if you are African American.  Age. Risk increases with age.  Gender. Men are at higher risk than women before age 45 years. After age 65, women are at higher risk than men. Risk factors you can control include:  Not getting enough exercise or physical activity.  Being overweight.  Getting too much fat, sugar, calories, or salt in your diet.  Drinking too much alcohol. SIGNS AND SYMPTOMS Hypertension does not usually cause signs or symptoms. Extremely high blood pressure (hypertensive crisis) may cause headache, anxiety, shortness of breath, and nosebleed. DIAGNOSIS To check if you have hypertension, your health care provider will measure your blood pressure while you are seated, with your arm held at the level of your heart. It should be measured at least twice using   the same arm. Certain conditions can cause a difference in blood pressure between your right and left arms. A blood pressure reading that is higher than normal on one occasion does not mean that you need treatment. If it is not clear whether you have high blood pressure, you may be asked to return on a different day to have your blood pressure checked again. Or, you may be asked to monitor your blood pressure at home for 1 or more weeks. TREATMENT Treating high blood pressure includes making lifestyle changes and possibly taking medicine. Living a healthy lifestyle can help lower high blood pressure. You may need to change some of your habits. Lifestyle changes may include:  Following the DASH diet. This diet is high in fruits, vegetables, and whole grains. It is low in  salt, red meat, and added sugars.  Keep your sodium intake below 2,300 mg per day.  Getting at least 30-45 minutes of aerobic exercise at least 4 times per week.  Losing weight if necessary.  Not smoking.  Limiting alcoholic beverages.  Learning ways to reduce stress. Your health care provider may prescribe medicine if lifestyle changes are not enough to get your blood pressure under control, and if one of the following is true:  You are 18-59 years of age and your systolic blood pressure is above 140.  You are 60 years of age or older, and your systolic blood pressure is above 150.  Your diastolic blood pressure is above 90.  You have diabetes, and your systolic blood pressure is over 140 or your diastolic blood pressure is over 90.  You have kidney disease and your blood pressure is above 140/90.  You have heart disease and your blood pressure is above 140/90. Your personal target blood pressure may vary depending on your medical conditions, your age, and other factors. HOME CARE INSTRUCTIONS  Have your blood pressure rechecked as directed by your health care provider.   Take medicines only as directed by your health care provider. Follow the directions carefully. Blood pressure medicines must be taken as prescribed. The medicine does not work as well when you skip doses. Skipping doses also puts you at risk for problems.  Do not smoke.   Monitor your blood pressure at home as directed by your health care provider. SEEK MEDICAL CARE IF:   You think you are having a reaction to medicines taken.  You have recurrent headaches or feel dizzy.  You have swelling in your ankles.  You have trouble with your vision. SEEK IMMEDIATE MEDICAL CARE IF:  You develop a severe headache or confusion.  You have unusual weakness, numbness, or feel faint.  You have severe chest or abdominal pain.  You vomit repeatedly.  You have trouble breathing. MAKE SURE YOU:   Understand  these instructions.  Will watch your condition.  Will get help right away if you are not doing well or get worse.   This information is not intended to replace advice given to you by your health care provider. Make sure you discuss any questions you have with your health care provider.   Document Released: 05/05/2005 Document Revised: 09/19/2014 Document Reviewed: 02/25/2013 Elsevier Interactive Patient Education 2016 Elsevier Inc.  

## 2015-09-07 ENCOUNTER — Encounter: Payer: Self-pay | Admitting: Internal Medicine

## 2015-09-27 ENCOUNTER — Ambulatory Visit (AMBULATORY_SURGERY_CENTER): Payer: Self-pay

## 2015-09-27 VITALS — Ht 64.0 in | Wt 201.6 lb

## 2015-09-27 DIAGNOSIS — Z1211 Encounter for screening for malignant neoplasm of colon: Secondary | ICD-10-CM

## 2015-09-27 MED FILL — ?HYDROCHLOROTHIAZIDE 25 MG: 25 MG | 30 days supply | Qty: 30 | Fill #0

## 2015-09-27 MED FILL — AMLODIPINE BESYLATE 10 MG T: 10 | 30 days supply | Qty: 30 | Fill #0

## 2015-09-27 MED FILL — LISINOPRIL 40 MG TABLET: 40 | 30 days supply | Qty: 30 | Fill #0

## 2015-09-27 NOTE — Progress Notes (Signed)
No allergies to eggs or soy No home oxygen No diet meds No past problems with anesthesia  No internet 

## 2015-10-11 ENCOUNTER — Encounter: Payer: Self-pay | Admitting: Gastroenterology

## 2015-10-11 ENCOUNTER — Ambulatory Visit (AMBULATORY_SURGERY_CENTER): Payer: Medicare Other | Admitting: Gastroenterology

## 2015-10-11 VITALS — BP 158/82 | HR 39 | Temp 97.7°F | Resp 13 | Ht 64.0 in | Wt 201.0 lb

## 2015-10-11 DIAGNOSIS — D122 Benign neoplasm of ascending colon: Secondary | ICD-10-CM | POA: Diagnosis not present

## 2015-10-11 DIAGNOSIS — D125 Benign neoplasm of sigmoid colon: Secondary | ICD-10-CM | POA: Diagnosis not present

## 2015-10-11 DIAGNOSIS — K6289 Other specified diseases of anus and rectum: Secondary | ICD-10-CM

## 2015-10-11 DIAGNOSIS — Z1211 Encounter for screening for malignant neoplasm of colon: Secondary | ICD-10-CM

## 2015-10-11 DIAGNOSIS — K635 Polyp of colon: Secondary | ICD-10-CM | POA: Diagnosis not present

## 2015-10-11 MED ORDER — SODIUM CHLORIDE 0.9 % IV SOLN: 500.0000 mL | INTRAVENOUS | Status: DC

## 2015-10-11 NOTE — Progress Notes (Signed)
A and Ox 3 Report to RN 

## 2015-10-11 NOTE — Patient Instructions (Signed)
YOU HAD AN ENDOSCOPIC PROCEDURE TODAY AT THE [facility]:   Refer to the procedure report that was given to you for any specific questions about what was found during the examination.  If the procedure report does not answer your questions, please call your gastroenterologist to clarify.  If you requested that your care partner not be given the details of your procedure findings, then the procedure report has been included in a sealed envelope for you to review at your convenience later.  YOU SHOULD EXPECT: Some feelings of bloating in the abdomen. Passage of more gas than usual.  Walking can help get rid of the air that was put into your GI tract during the procedure and reduce the bloating. If you had a lower endoscopy (such as a colonoscopy or flexible sigmoidoscopy) you may notice spotting of blood in your stool or on the toilet paper. If you underwent a bowel prep for your procedure, you may not have a normal bowel movement for a few days.  Please Note:  You might notice some irritation and congestion in your nose or some drainage.  This is from the oxygen used during your procedure.  There is no need for concern and it should clear up in a day or so.  SYMPTOMS TO REPORT IMMEDIATELY:   Following lower endoscopy (colonoscopy or flexible sigmoidoscopy):  Excessive amounts of blood in the stool  Significant tenderness or worsening of abdominal pains  Swelling of the abdomen that is new, acute  Fever of 100F or higher   For urgent or emergent issues, a gastroenterologist can be reached at any hour by calling [phone].   DIET: Your first meal following the procedure should be a small meal and then it is ok to progress to your normal diet. Heavy or fried foods are harder to digest and may make you feel nauseous or bloated.  Likewise, meals heavy in dairy and vegetables can increase bloating.  Drink plenty of fluids but you should avoid alcoholic beverages for 24  hours.  ACTIVITY:  You should plan to take it easy for the rest of today and you should NOT DRIVE or use heavy machinery until tomorrow (because of the sedation medicines used during the test).    FOLLOW UP: Our staff will call the number listed on your records the next business day following your procedure to check on you and address any questions or concerns that you may have regarding the information given to you following your procedure. If we do not reach you, we will leave a message.  However, if you are feeling well and you are not experiencing any problems, there is no need to return our call.  We will assume that you have returned to your regular daily activities without incident.  If any biopsies were taken you will be contacted by phone or by letter within the next 1-3 weeks.  Please call us at [phone] if you have not heard about the biopsies in 3 weeks.    SIGNATURES/CONFIDENTIALITY: You and/or your care partner have signed paperwork which will be entered into your electronic medical record.  These signatures attest to the fact that that the information above on your After Visit Summary has been reviewed and is understood.  Full responsibility of the confidentiality of this discharge information lies with you and/or your care-partner.  Read all of the handouts given to you by your recovery room nurse. 

## 2015-10-11 NOTE — Progress Notes (Signed)
Called to room to assist during endoscopic procedure.  Patient ID and intended procedure confirmed with present staff. Received instructions for my participation in the procedure from the performing physician.  

## 2015-10-11 NOTE — Op Note (Signed)
[facility] Patient Name: Todd Ferguson Procedure Date: 10/11/2015 9:04 AM MRN: [id number] Endoscopist: Mauri Pole , MD Age: 75 Referring MD:  Date of Birth: Oct 14, 1940 Gender: Male Procedure:                Colonoscopy Indications:              Screening for colorectal malignant neoplasm, This                            is the patient's first colonoscopy Medicines:                Monitored Anesthesia Care Procedure:                Pre-Anesthesia Assessment:                           - Prior to the procedure, a History and Physical                            was performed, and patient medications and                            allergies were reviewed. The patient's tolerance of                            previous anesthesia was also reviewed. The risks                            and benefits of the procedure and the sedation                            options and risks were discussed with the patient.                            All questions were answered, and informed consent                            was obtained. Prior Anticoagulants: The patient has                            taken no previous anticoagulant or antiplatelet                            agents. ASA Grade Assessment: II - A patient with                            mild systemic disease. After reviewing the risks                            and benefits, the patient was deemed in                            satisfactory condition to undergo the procedure.  After obtaining informed consent, the colonoscope                            was passed under direct vision. Throughout the                            procedure, the patient's blood pressure, pulse, and                            oxygen saturations were monitored continuously. The                            Model PCF-H190L [id number]) scope was introduced                            through the anus and advanced to the the  terminal                            ileum, with identification of the appendiceal                            orifice and IC valve. The colonoscopy was performed                            without difficulty. The patient tolerated the                            procedure well. The quality of the bowel                            preparation was adequate. The terminal ileum,                            ileocecal valve, appendiceal orifice, and rectum                            were photographed. Scope In: 9:14:41 AM Scope Out: 9:44:31 AM Scope Withdrawal Time: 0 hours 24 minutes 38 seconds  Total Procedure Duration: 0 hours 29 minutes 50 seconds  Findings:                 The perianal and digital rectal examinations were                            normal.                           Four sessile polyps were found in the sigmoid colon                            and ascending colon. The polyps were 5 to 9 mm in                            size. These polyps were removed with a cold snare.  Resection and retrieval were complete. 57mm polyp in                            ascending colon removed by cold forceps. Irregular                            nodularity at appendicial orifice was biopsied                           A patchy area of mildly altered vascular,                            congested, erythematous and granular mucosa was                            found at the anus. Biopsies were taken with a cold                            forceps for histology. Complications:            No immediate complications. Estimated Blood Loss:     Estimated blood loss was minimal. Impression:               - Four 3 to 9 mm polyps in the sigmoid colon and in                            the ascending colon, removed with a cold snare.                            Resected and retrieved.                           - Altered vascular, congested, erythematous and                             granular mucosa at the anus. Biopsied. Recommendation:           - Patient has a contact number available for                            emergencies. The signs and symptoms of potential                            delayed complications were discussed with the                            patient. Return to normal activities tomorrow.                            Written discharge instructions were provided to the                            patient.                           -  Resume previous diet.                           - Continue present medications.                           - Await pathology results.                           - Repeat colonoscopy is recommended for                            surveillance. The colonoscopy date will be                            determined after pathology results from today's                            exam become available for review.                           - Return to GI clinic PRN. Mauri Pole, MD 10/11/2015 9:52:23 AM This report has been signed electronically.

## 2015-10-11 NOTE — Progress Notes (Signed)
Discharge instructions given to patient.  Wife did not pay any attention to instructions, and got angry when I told her to call her ride that he'd be ready in about 15 minutes.  Patient understood discharge instructions and gave good feedback.   I asked the wife to give [place] their new address and she declined and stated that "I just want to get out of here."

## 2015-10-12 ENCOUNTER — Telehealth: Payer: Self-pay

## 2015-10-12 NOTE — Telephone Encounter (Signed)
  Follow up Call-  Call back number 10/11/2015  Post procedure Call Back phone  # [phone] cell  Permission to leave phone message Yes     Patient questions:  Do you have a fever, pain , or abdominal swelling? No. Pain Score  0 *  Have you tolerated food without any problems? Yes.    Have you been able to return to your normal activities? Yes.    Do you have any questions about your discharge instructions: Diet   No. Medications  No. Follow up visit  No.  Do you have questions or concerns about your Care? No.  Actions: * If pain score is 4 or above: No action needed, pain <4.

## 2015-10-22 ENCOUNTER — Encounter: Payer: Self-pay | Admitting: Gastroenterology

## 2015-11-07 ENCOUNTER — Encounter (HOSPITAL_COMMUNITY): Payer: Self-pay | Admitting: Emergency Medicine

## 2015-11-07 ENCOUNTER — Other Ambulatory Visit: Payer: Self-pay

## 2015-11-07 ENCOUNTER — Emergency Department (HOSPITAL_COMMUNITY)
Admission: EM | Admit: 2015-11-07 | Discharge: 2015-11-07 | Disposition: A | Discharge status: Home / Self Care | Payer: Medicare Other | Attending: Emergency Medicine | Admitting: Emergency Medicine

## 2015-11-07 ENCOUNTER — Emergency Department (HOSPITAL_COMMUNITY): Payer: Medicare Other

## 2015-11-07 DIAGNOSIS — I10 Essential (primary) hypertension: Secondary | ICD-10-CM | POA: Insufficient documentation

## 2015-11-07 DIAGNOSIS — R031 Nonspecific low blood-pressure reading: Secondary | ICD-10-CM | POA: Diagnosis not present

## 2015-11-07 DIAGNOSIS — Z7982 Long term (current) use of aspirin: Secondary | ICD-10-CM | POA: Diagnosis not present

## 2015-11-07 DIAGNOSIS — F17210 Nicotine dependence, cigarettes, uncomplicated: Secondary | ICD-10-CM | POA: Diagnosis not present

## 2015-11-07 DIAGNOSIS — R55 Syncope and collapse: Secondary | ICD-10-CM | POA: Diagnosis not present

## 2015-11-07 DIAGNOSIS — Z79899 Other long term (current) drug therapy: Secondary | ICD-10-CM | POA: Insufficient documentation

## 2015-11-07 DIAGNOSIS — S069X9A Unspecified intracranial injury with loss of consciousness of unspecified duration, initial encounter: Secondary | ICD-10-CM | POA: Diagnosis not present

## 2015-11-07 DIAGNOSIS — R42 Dizziness and giddiness: Secondary | ICD-10-CM | POA: Diagnosis not present

## 2015-11-07 LAB — CBC WITH DIFFERENTIAL/PLATELET
Basophils Absolute: 0 10*3/uL (ref 0.0–0.1)
Basophils Relative: 0 %
EOS ABS: 0.1 10*3/uL (ref 0.0–0.7)
EOS PCT: 1 %
HCT: 44.5 % (ref 39.0–52.0)
HEMOGLOBIN: 14.7 g/dL (ref 13.0–17.0)
LYMPHS ABS: 1.9 10*3/uL (ref 0.7–4.0)
Lymphocytes Relative: 26 %
MCH: 29.1 pg (ref 26.0–34.0)
MCHC: 33 g/dL (ref 30.0–36.0)
MCV: 87.9 fL (ref 78.0–100.0)
MONOS PCT: 6 %
Monocytes Absolute: 0.5 10*3/uL (ref 0.1–1.0)
NEUTROS PCT: 67 %
Neutro Abs: 4.9 10*3/uL (ref 1.7–7.7)
Platelets: 216 10*3/uL (ref 150–400)
RBC: 5.06 MIL/uL (ref 4.22–5.81)
RDW: 13.5 % (ref 11.5–15.5)
WBC: 7.4 10*3/uL (ref 4.0–10.5)

## 2015-11-07 LAB — URINALYSIS, ROUTINE W REFLEX MICROSCOPIC
BILIRUBIN URINE: NEGATIVE
GLUCOSE, UA: NEGATIVE mg/dL
HGB URINE DIPSTICK: NEGATIVE
KETONES UR: NEGATIVE mg/dL
Leukocytes, UA: NEGATIVE
Nitrite: NEGATIVE
PROTEIN: NEGATIVE mg/dL
Specific Gravity, Urine: 1.016 (ref 1.005–1.030)
pH: 5 (ref 5.0–8.0)

## 2015-11-07 LAB — BASIC METABOLIC PANEL
Anion gap: 10 (ref 5–15)
BUN: 14 mg/dL (ref 6–20)
CALCIUM: 9.5 mg/dL (ref 8.9–10.3)
CHLORIDE: 103 mmol/L (ref 101–111)
CO2: 24 mmol/L (ref 22–32)
CREATININE: 1.43 mg/dL — AB (ref 0.61–1.24)
GFR calc Af Amer: 55 mL/min — ABNORMAL LOW (ref >60)
GFR, EST NON AFRICAN AMERICAN: 47 mL/min — AB (ref >60)
Glucose, Bld: 132 mg/dL — ABNORMAL HIGH (ref 65–99)
Potassium: 3.6 mmol/L (ref 3.5–5.1)
SODIUM: 137 mmol/L (ref 135–145)

## 2015-11-07 MED ORDER — SODIUM CHLORIDE 0.9 % IV BOLUS (SEPSIS)
1000.0000 mL | Freq: Once | INTRAVENOUS | Status: AC
Start: 2015-11-07 — End: 2015-11-07
  Administered 2015-11-07: 1000 mL via INTRAVENOUS

## 2015-11-07 MED ORDER — SODIUM CHLORIDE 0.9 % IV BOLUS (SEPSIS)
1000.0000 mL | Freq: Once | INTRAVENOUS | Status: AC
  Administered 2015-11-07: 1000 mL via INTRAVENOUS

## 2015-11-07 NOTE — Discharge Instructions (Signed)
Stay well hydrated and drink lots of fluids. Schedule a follow up appointment with your PCP for a visit to recheck your creatinine which was elevated today.    Near-Syncope Near-syncope (commonly known as near fainting) is sudden weakness, dizziness, or feeling like you might pass out. During an episode of near-syncope, you may also develop pale skin, have tunnel vision, or feel sick to your stomach (nauseous). Near-syncope may occur when getting up after sitting or while standing for a long time. It is caused by a sudden decrease in blood flow to the brain. This decrease can result from various causes or triggers, most of which are not serious. However, because near-syncope can sometimes be a sign of something serious, a medical evaluation is required. The specific cause is often not determined. HOME CARE INSTRUCTIONS  Monitor your condition for any changes. The following actions may help to alleviate any discomfort you are experiencing:  Have someone stay with you until you feel stable.  Lie down right away and prop your feet up if you start feeling like you might faint. Breathe deeply and steadily. Wait until all the symptoms have passed. Most of these episodes last only a few minutes. You may feel tired for several hours.   Drink enough fluids to keep your urine clear or pale yellow.   If you are taking blood pressure or heart medicine, get up slowly when seated or lying down. Take several minutes to sit and then stand. This can reduce dizziness.  Follow up with your health care provider as directed. SEEK IMMEDIATE MEDICAL CARE IF:   You have a severe headache.   You have unusual pain in the chest, abdomen, or back.   You are bleeding from the mouth or rectum, or you have black or tarry stool.   You have an irregular or very fast heartbeat.   You have repeated fainting or have seizure-like jerking during an episode.   You faint when sitting or lying down.   You have  confusion.   You have difficulty walking.   You have severe weakness.   You have vision problems.  MAKE SURE YOU:   Understand these instructions.  Will watch your condition.  Will get help right away if you are not doing well or get worse.   This information is not intended to replace advice given to you by your health care provider. Make sure you discuss any questions you have with your health care provider.   Document Released: 05/05/2005 Document Revised: 05/10/2013 Document Reviewed: 10/08/2012 Elsevier Interactive Patient Education 2016 [employer] Dehydration, Adult Dehydration is a condition in which you do not have enough fluid or water in your body. It happens when you take in less fluid than you lose. Vital organs such as the kidneys, brain, and heart cannot function without a proper amount of fluids. Any loss of fluids from the body can cause dehydration.  Dehydration can range from mild to severe. This condition should be treated right away to help prevent it from becoming severe. CAUSES  This condition may be caused by:  Vomiting.  Diarrhea.  Excessive sweating, such as when exercising in hot or humid weather.  Not drinking enough fluid during strenuous exercise or during an illness.  Excessive urine output.  Fever.  Certain medicines. RISK FACTORS This condition is more likely to develop in:  People who are taking certain medicines that cause the body to lose excess fluid (diuretics).   People who have a chronic illness, such  as diabetes, that may increase urination.  Older adults.   People who live at high altitudes.   People who participate in endurance sports.  SYMPTOMS  Mild Dehydration  Thirst.  Dry lips.  Slightly dry mouth.  Dry, warm skin. Moderate Dehydration  Very dry mouth.   Muscle cramps.   Dark urine and decreased urine production.   Decreased tear production.   Headache.   Light-headedness,  especially when you stand up from a sitting position.  Severe Dehydration  Changes in skin.   Cold and clammy skin.   Skin does not spring back quickly when lightly pinched and released.   Changes in body fluids.   Extreme thirst.   No tears.   Not able to sweat when body temperature is high, such as in hot weather.   Minimal urine production.   Changes in vital signs.   Rapid, weak pulse (more than 100 beats per minute when you are sitting still).   Rapid breathing.   Low blood pressure.   Other changes.   Sunken eyes.   Cold hands and feet.   Confusion.  Lethargy and difficulty being awakened.  Fainting (syncope).   Short-term weight loss.   Unconsciousness. DIAGNOSIS  This condition may be diagnosed based on your symptoms. You may also have tests to determine how severe your dehydration is. These tests may include:   Urine tests.   Blood tests.  TREATMENT  Treatment for this condition depends on the severity. Mild or moderate dehydration can often be treated at home. Treatment should be started right away. Do not wait until dehydration becomes severe. Severe dehydration needs to be treated at the hospital. Treatment for Mild Dehydration  Drinking plenty of water to replace the fluid you have lost.   Replacing minerals in your blood (electrolytes) that you may have lost.  Treatment for Moderate Dehydration  Consuming oral rehydration solution (ORS). Treatment for Severe Dehydration  Receiving fluid through an IV tube.   Receiving electrolyte solution through a feeding tube that is passed through your nose and into your stomach (nasogastric tube or NG tube).  Correcting any abnormalities in electrolytes. HOME CARE INSTRUCTIONS   Drink enough fluid to keep your urine clear or pale yellow.   Drink water or fluid slowly by taking small sips. You can also try sucking on ice cubes.  Have food or beverages that contain  electrolytes. Examples include bananas and sports drinks.  Take over-the-counter and prescription medicines only as told by your health care provider.   Prepare ORS according to the manufacturer's instructions. Take sips of ORS every 5 minutes until your urine returns to normal.  If you have vomiting or diarrhea, continue to try to drink water, ORS, or both.   If you have diarrhea, avoid:   Beverages that contain caffeine.   Fruit juice.   Milk.   Carbonated soft drinks.  Do not take salt tablets. This can lead to the condition of having too much sodium in your body (hypernatremia).  SEEK MEDICAL CARE IF:  You cannot eat or drink without vomiting.  You have had moderate diarrhea during a period of more than 24 hours.  You have a fever. SEEK IMMEDIATE MEDICAL CARE IF:   You have extreme thirst.  You have severe diarrhea.  You have not urinated in 6-8 hours, or you have urinated only a small amount of very dark urine.  You have shriveled skin.  You are dizzy, confused, or both.   This information is  not intended to replace advice given to you by your health care provider. Make sure you discuss any questions you have with your health care provider.   Document Released: 05/05/2005 Document Revised: 01/24/2015 Document Reviewed: 09/20/2014 Elsevier Interactive Patient Education [employer].

## 2015-11-07 NOTE — ED Provider Notes (Signed)
CSN: [id number]     Arrival date & time 11/07/15  1940 History   First MD Initiated Contact with Patient 11/07/15 1954     Chief Complaint  Patient presents with  . Near Syncope   HPI  Todd Ferguson is a 75 year old male with PMHx of HTN and cataracts presenting after a near syncopal event. Pt states he walked to the local church to get dinner to bring back to his house. He states "it wasn't too far but it was hot outside". He sat outside to eat and began feeling lightheaded. He states that this sensation lasted approximately one minute and he decided to go back into the house to lie down. Once he walked into the house, he states his lightheadedness worsened and he began falling to the floor. A relative in the home helped the patient sit on the floor. Pt and family deny fall to the floor or head injury. Pt states he is unsure if he passed out because he was "out of it". His wife is at bedside and states that he had a seizure at this time. She reports pt's eyes were open and he was shivering. She states that he was verbal but "wasn't making much sense because he was out of it". She is unsure how long this episode lasted. They poured cold water on his head and pt returned to baseline. No reported post-ictal phase. Pt states he is currently asymptomatic and feels that he is back to normal. Denies headache, vision changes, numbness, weakness, neck pain, arm pain, chest pain, shortness of breath, nausea, vomiting or any other symptoms during the episode. He has no complaints at this time. He does admit to drinking one beer and smoking marijuana before this episode. Pt reports no water intake today. Denies history of seizures. Denies cardiac history other than HTN.   Per EMS, pt's BP was 80s/40s on their arrival. Pt received 500 cc bolus in route and BP before presentation to ED was 88/50. Pt reported to EMS that he felt dizzy upon standing but states this sensation has now resolved.   Past Medical History   Diagnosis Date  . Hypertension   . 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   Past Surgical History  Procedure Laterality Date  . Appendectomy    . Dental surgery    . Removed right eye     Family History  Problem Relation Age of Onset  . Diabetes Brother   . Heart disease Mother   . Kidney disease Mother   . Colon cancer Neg Hx   . Esophageal cancer Neg Hx   . Rectal cancer Neg Hx   . Stomach cancer Neg Hx   . Prostate cancer Neg Hx   . Pancreatic cancer Neg Hx    Social History  Substance Use Topics  . Smoking status: Current Every Day Smoker -- 0.25 packs/day    Types: Cigarettes  . Smokeless tobacco: Never Used  . Alcohol Use: Yes     Comment: socially    Review of Systems  Respiratory: Negative for shortness of breath.   Cardiovascular: Negative for chest pain.  Gastrointestinal: Negative for nausea, vomiting and abdominal pain.  Neurological: Positive for syncope (near syncope) and light-headedness. Negative for facial asymmetry, speech difficulty, weakness, numbness and headaches.  All other systems reviewed and are negative.     Allergies  Review of patient's allergies indicates  no known allergies.  Home Medications   Prior to Admission medications   Medication Sig Start Date End Date Taking? Authorizing Provider  amLODipine (NORVASC) 10 MG tablet Take 1 tablet (10 mg total) by mouth daily. 09/06/15  Yes Tresa Garter, MD  hydrochlorothiazide (HYDRODIURIL) 25 MG tablet Take 1 tablet (25 mg total) by mouth daily. 09/06/15  Yes Tresa Garter, MD  lisinopril (PRINIVIL,ZESTRIL) 40 MG tablet Take 1 tablet (40 mg total) by mouth daily. 09/06/15  Yes Tresa Garter, MD  aspirin EC 81 MG tablet Take 1 tablet (81 mg total) by mouth daily. Patient not taking: Reported on 11/07/2015 05/10/13   Reyne Dumas, MD   BP 132/71 mmHg  Pulse 80  Temp(Src) 97.7 F (36.5 C) (Oral)  Resp 20  SpO2  97% Physical Exam  Constitutional: He is oriented to person, place, and time. He appears well-developed and well-nourished. No distress.  HENT:  Head: Normocephalic and atraumatic.  Mouth/Throat: Oropharynx is clear and moist. No oropharyngeal exudate.  Eyes: Conjunctivae and EOM are normal. Pupils are equal, round, and reactive to light. Right eye exhibits no discharge. Left eye exhibits no discharge. No scleral icterus.  Prosthetic right eye  Neck: Normal range of motion. Neck supple.  Cardiovascular: Normal rate, regular rhythm and normal heart sounds.   Pulmonary/Chest: Effort normal and breath sounds normal. No respiratory distress.  Abdominal: Soft. He exhibits no distension. There is no tenderness.  Musculoskeletal: Normal range of motion.  Ambulates with a steady gait  Neurological: He is alert and oriented to person, place, and time. He has normal strength. No cranial nerve deficit or sensory deficit. Coordination normal.  Cranial nerves 3-12 tested and intact. 5/5 strength in all major muscle groups. Sensation to light touch intact throughout. Coordinated heel to shin. Negative romberg.   Skin: Skin is warm and dry.  Psychiatric: He has a normal mood and affect. His behavior is normal.  Nursing note and vitals reviewed.   ED Course  Procedures (including critical care time) Labs Review Labs Reviewed  BASIC METABOLIC PANEL - Abnormal; Notable for the following:    Glucose, Bld 132 (*)    Creatinine, Ser 1.43 (*)    GFR calc non Af Amer 47 (*)    GFR calc Af Amer 55 (*)    All other components within normal limits  CBC WITH DIFFERENTIAL/PLATELET  URINALYSIS, ROUTINE W REFLEX MICROSCOPIC (NOT AT [facility])    Imaging Review Ct Head Wo Contrast  11/07/2015  CLINICAL DATA:  Pt in episode of near syncope after raking all day in hot sun. States he was sitting in his chair on the porch when he suddenly felt dizzy and fell, H/O HTN. No previous episode EXAM: CT HEAD WITHOUT CONTRAST  TECHNIQUE: Contiguous axial images were obtained from the base of the skull through the vertex without intravenous contrast. COMPARISON:  05/10/2010 FINDINGS: Brain: No evidence of acute infarction, hemorrhage, extra-axial collection, ventriculomegaly, or mass effect. Vascular: No hyperdense vessel or unexpected calcification. Atherosclerotic and physiologic intracranial calcifications. Skull: Negative for fracture or focal lesion. Sinuses/Orbits: Patchy opacification of ethmoid air cells bilaterally. Mucoperiosteal thickening in the left maxillary and sphenoid sinuses. Prosthetic right globe. Other: None. IMPRESSION: 1. Negative for bleed or other acute intracranial process. 2. Paranasal sinus disease as above Electronically Signed   By: Lucrezia Europe M.D.   On: 11/07/2015 21:07   I have personally reviewed and evaluated these images and lab results as part of my medical decision-making.   EKG  Interpretation   Date/Time:  Wednesday November 07 2015 19:54:38 EDT Ventricular Rate:  72 PR Interval:    QRS Duration: 71 QT Interval:  365 QTC Calculation: 400 R Axis:   57 Text Interpretation:  Sinus rhythm Since last tracing rate faster  Confirmed by KNAPP  MD-J, JON [id number]) on 11/07/2015 8:02:24 PM      MDM   Final diagnoses:  Near syncope   75 year old male presenting after near syncopal event. Pt is asymptomatic at present. History of being outside in hot weather without sufficient water intake prior to symptom onset. No prodromal symptoms other than lightheadedness. EMS reported patient was hypotensive to 80/40s on their arrival. After 1.5 L bolus, blood pressure improved to 122/57. Pt is nontoxic appearing. Non-focal neuro exam. Heart RRR. Blood work and UA largely unremarkable. Creatine elevated to 1.43 without hx of kidney disease. CT head negative. ECG NSR. Monitored pt in ER over 2 hours without recurrent symptoms. Received total of 2.5 L fluids. Ambulated without difficulty and no reported  lightheadedness. No significant cardiac history. Discussed staying well hydrated and following up with PCP in the next 1-2 weeks to recheck creatinine.  At this time there does not appear to be any evidence of an acute emergency medical condition and the patient appears stable for discharge with appropriate outpatient follow up. Diagnosis was discussed with patient who verbalizes understanding and is agreeable to discharge. Pt case discussed with Dr. Tomi Bamberger who agrees with my plan. Return precautions given in discharge paperwork and discussed with pt at bedside. Pt is stable for discharge.     Josephina Gip, PA-C 11/07/15 2240  Carollee Nussbaumer, PA-C 11/07/15 [id number]  Dorie Rank, MD 11/07/15 2248

## 2015-11-07 NOTE — ED Notes (Signed)
Per [facility], pt was outside in the hot sun all day, only drank one beer and no water. Pt walked inside had near syncopal episode, denies fall, pt sat down on the floor. EMS BP 80s/40s. PT given 500ccs saline, last Bp 88/50. Pt asymptomatic lying flat, sitting up he feels dizzy. Pt in NAD at this time, denies pain, AAOX4.

## 2015-11-07 NOTE — ED Notes (Signed)
Patient ambulated 200 feet. Tolerated well.

## 2015-11-28 MED FILL — AMLODIPINE BESYLATE 10 MG T: 10 | 30 days supply | Qty: 30 | Fill #1

## 2015-11-28 MED FILL — HYDROCHLOROTHIAZIDE 25 MG T: 25 | 30 days supply | Qty: 30 | Fill #1

## 2015-11-28 MED FILL — LISINOPRIL 40 MG TABLET: 40 | 30 days supply | Qty: 30 | Fill #1

## 2016-01-29 MED FILL — HYDROCHLOROTHIAZIDE 25 MG T: 25 | 30 days supply | Qty: 30 | Fill #2

## 2016-01-29 MED FILL — AMLODIPINE BESYLATE 10 MG T: 10 | 30 days supply | Qty: 30 | Fill #2

## 2016-01-30 MED FILL — ?LISINOPRIL 40 MG TABLET: 40 MG | 30 days supply | Qty: 30 | Fill #2

## 2016-02-07 ENCOUNTER — Ambulatory Visit: Payer: Medicare Other | Attending: Internal Medicine | Admitting: Internal Medicine

## 2016-02-07 ENCOUNTER — Encounter: Payer: Self-pay | Admitting: Internal Medicine

## 2016-02-07 VITALS — BP 125/73 | HR 79 | Temp 98.5°F | Resp 18 | Ht 64.0 in | Wt 200.4 lb

## 2016-02-07 DIAGNOSIS — F17200 Nicotine dependence, unspecified, uncomplicated: Secondary | ICD-10-CM

## 2016-02-07 DIAGNOSIS — Z720 Tobacco use: Secondary | ICD-10-CM

## 2016-02-07 DIAGNOSIS — R7303 Prediabetes: Secondary | ICD-10-CM | POA: Insufficient documentation

## 2016-02-07 DIAGNOSIS — Z7982 Long term (current) use of aspirin: Secondary | ICD-10-CM | POA: Diagnosis not present

## 2016-02-07 DIAGNOSIS — Z79899 Other long term (current) drug therapy: Secondary | ICD-10-CM | POA: Diagnosis not present

## 2016-02-07 DIAGNOSIS — Z23 Encounter for immunization: Secondary | ICD-10-CM | POA: Insufficient documentation

## 2016-02-07 DIAGNOSIS — Z0000 Encounter for general adult medical examination without abnormal findings: Secondary | ICD-10-CM

## 2016-02-07 DIAGNOSIS — E785 Hyperlipidemia, unspecified: Secondary | ICD-10-CM | POA: Diagnosis not present

## 2016-02-07 DIAGNOSIS — F17210 Nicotine dependence, cigarettes, uncomplicated: Secondary | ICD-10-CM | POA: Insufficient documentation

## 2016-02-07 DIAGNOSIS — I10 Essential (primary) hypertension: Secondary | ICD-10-CM | POA: Diagnosis not present

## 2016-02-07 LAB — COMPLETE METABOLIC PANEL WITH GFR
ALK PHOS: 40 U/L (ref 40–115)
ALT: 12 U/L (ref 9–46)
AST: 13 U/L (ref 10–35)
Albumin: 4.3 g/dL (ref 3.6–5.1)
BILIRUBIN TOTAL: 0.5 mg/dL (ref 0.2–1.2)
BUN: 17 mg/dL (ref 7–25)
CALCIUM: 9.4 mg/dL (ref 8.6–10.3)
CO2: 22 mmol/L (ref 20–31)
CREATININE: 1.31 mg/dL — AB (ref 0.70–1.18)
Chloride: 102 mmol/L (ref 98–110)
GFR, EST AFRICAN AMERICAN: 62 mL/min (ref >=60)
GFR, EST NON AFRICAN AMERICAN: 54 mL/min — AB (ref >=60)
Glucose, Bld: 142 mg/dL — ABNORMAL HIGH (ref 65–99)
Potassium: 4.3 mmol/L (ref 3.5–5.3)
Sodium: 136 mmol/L (ref 135–146)
TOTAL PROTEIN: 7 g/dL (ref 6.1–8.1)

## 2016-02-07 NOTE — Patient Instructions (Addendum)
Steps to Quit Smoking  Smoking tobacco can be harmful to your health and can affect almost every organ in your body. Smoking puts you, and those around you, at risk for developing many serious chronic diseases. Quitting smoking is difficult, but it is one of the best things that you can do for your health. It is never too late to quit. WHAT ARE THE BENEFITS OF QUITTING SMOKING? When you quit smoking, you lower your risk of developing serious diseases and conditions, such as:  Lung cancer or lung disease, such as COPD.  Heart disease.  Stroke.  Heart attack.  Infertility.  Osteoporosis and bone fractures. Additionally, symptoms such as coughing, wheezing, and shortness of breath may get better when you quit. You may also find that you get sick less often because your body is stronger at fighting off colds and infections. If you are pregnant, quitting smoking can help to reduce your chances of having a baby of low birth weight. HOW DO I GET READY TO QUIT? When you decide to quit smoking, create a plan to make sure that you are successful. Before you quit:  Pick a date to quit. Set a date within the next two weeks to give you time to prepare.  Write down the reasons why you are quitting. Keep this list in places where you will see it often, such as on your bathroom mirror or in your car or wallet.  Identify the people, places, things, and activities that make you want to smoke (triggers) and avoid them. Make sure to take these actions:  Throw away all cigarettes at home, at work, and in your car.  Throw away smoking accessories, such as ashtrays and lighters.  Clean your car and make sure to empty the ashtray.  Clean your home, including curtains and carpets.  Tell your family, friends, and coworkers that you are quitting. Support from your loved ones can make quitting easier.  Talk with your health care provider about your options for quitting smoking.  Find out what treatment  options are covered by your health insurance. WHAT STRATEGIES CAN I USE TO QUIT SMOKING?  Talk with your healthcare provider about different strategies to quit smoking. Some strategies include:  Quitting smoking altogether instead of gradually lessening how much you smoke over a period of time. Research shows that quitting "cold turkey" is more successful than gradually quitting.  Attending in-person counseling to help you build problem-solving skills. You are more likely to have success in quitting if you attend several counseling sessions. Even short sessions of 10 minutes can be effective.  Finding resources and support systems that can help you to quit smoking and remain smoke-free after you quit. These resources are most helpful when you use them often. They can include:  Online chats with a counselor.  Telephone quitlines.  Printed self-help materials.  Support groups or group counseling.  Text messaging programs.  Mobile phone applications.  Taking medicines to help you quit smoking. (If you are pregnant or breastfeeding, talk with your health care provider first.) Some medicines contain nicotine and some do not. Both types of medicines help with cravings, but the medicines that include nicotine help to relieve withdrawal symptoms. Your health care provider may recommend:  Nicotine patches, gum, or lozenges.  Nicotine inhalers or sprays.  Non-nicotine medicine that is taken by mouth. Talk with your health care provider about combining strategies, such as taking medicines while you are also receiving in-person counseling. Using these two strategies together makes   you more likely to succeed in quitting than if you used either strategy on its own. If you are pregnant or breastfeeding, talk with your health care provider about finding counseling or other support strategies to quit smoking. Do not take medicine to help you quit smoking unless told to do so by your health care  provider. WHAT THINGS CAN I DO TO MAKE IT EASIER TO QUIT? Quitting smoking might feel overwhelming at first, but there is a lot that you can do to make it easier. Take these important actions:  Reach out to your family and friends and ask that they support and encourage you during this time. Call telephone quitlines, reach out to support groups, or work with a counselor for support.  Ask people who smoke to avoid smoking around you.  Avoid places that trigger you to smoke, such as bars, parties, or smoke-break areas at work.  Spend time around people who do not smoke.  Lessen stress in your life, because stress can be a smoking trigger for some people. To lessen stress, try:  Exercising regularly.  Deep-breathing exercises.  Yoga.  Meditating.  Performing a body scan. This involves closing your eyes, scanning your body from head to toe, and noticing which parts of your body are particularly tense. Purposefully relax the muscles in those areas.  Download or purchase mobile phone or tablet apps (applications) that can help you stick to your quit plan by providing reminders, tips, and encouragement. There are many free apps, such as QuitGuide from the [employer] Office manager for Disease Control and Prevention). You can find other support for quitting smoking (smoking cessation) through smokefree.gov and other websites. HOW WILL I FEEL WHEN I QUIT SMOKING? Within the first 24 hours of quitting smoking, you may start to feel some withdrawal symptoms. These symptoms are usually most noticeable 2-3 days after quitting, but they usually do not last beyond 2-3 weeks. Changes or symptoms that you might experience include:  Mood swings.  Restlessness, anxiety, or irritation.  Difficulty concentrating.  Dizziness.  Strong cravings for sugary foods in addition to nicotine.  Mild weight gain.  Constipation.  Nausea.  Coughing or a sore throat.  Changes in how your medicines work in your  body.  A depressed mood.  Difficulty sleeping (insomnia). After the first 2-3 weeks of quitting, you may start to notice more positive results, such as:  Improved sense of smell and taste.  Decreased coughing and sore throat.  Slower heart rate.  Lower blood pressure.  Clearer skin.  The ability to breathe more easily.  Fewer sick days. Quitting smoking is very challenging for most people. Do not get discouraged if you are not successful the first time. Some people need to make many attempts to quit before they achieve long-term success. Do your best to stick to your quit plan, and talk with your health care provider if you have any questions or concerns.   This information is not intended to replace advice given to you by your health care provider. Make sure you discuss any questions you have with your health care provider.   Document Released: 04/29/2001 Document Revised: 09/19/2014 Document Reviewed: 09/19/2014 Elsevier Interactive Patient Education 2016 [employer]. Hypertension Hypertension, commonly called high blood pressure, is when the force of blood pumping through your arteries is too strong. Your arteries are the blood vessels that carry blood from your heart throughout your body. A blood pressure reading consists of a higher number over a lower number, such as 110/72.  The higher number (systolic) is the pressure inside your arteries when your heart pumps. The lower number (diastolic) is the pressure inside your arteries when your heart relaxes. Ideally you want your blood pressure below 120/80. Hypertension forces your heart to work harder to pump blood. Your arteries may become narrow or stiff. Having untreated or uncontrolled hypertension can cause heart attack, stroke, kidney disease, and other problems. RISK FACTORS Some risk factors for high blood pressure are controllable. Others are not.  Risk factors you cannot control include:   Race. You may be at higher risk  if you are African American.  Age. Risk increases with age.  Gender. Men are at higher risk than women before age 43 years. After age 4, women are at higher risk than men. Risk factors you can control include:  Not getting enough exercise or physical activity.  Being overweight.  Getting too much fat, sugar, calories, or salt in your diet.  Drinking too much alcohol. SIGNS AND SYMPTOMS Hypertension does not usually cause signs or symptoms. Extremely high blood pressure (hypertensive crisis) may cause headache, anxiety, shortness of breath, and nosebleed. DIAGNOSIS To check if you have hypertension, your health care provider will measure your blood pressure while you are seated, with your arm held at the level of your heart. It should be measured at least twice using the same arm. Certain conditions can cause a difference in blood pressure between your right and left arms. A blood pressure reading that is higher than normal on one occasion does not mean that you need treatment. If it is not clear whether you have high blood pressure, you may be asked to return on a different day to have your blood pressure checked again. Or, you may be asked to monitor your blood pressure at home for 1 or more weeks. TREATMENT Treating high blood pressure includes making lifestyle changes and possibly taking medicine. Living a healthy lifestyle can help lower high blood pressure. You may need to change some of your habits. Lifestyle changes may include:  Following the DASH diet. This diet is high in fruits, vegetables, and whole grains. It is low in salt, red meat, and added sugars.  Keep your sodium intake below 2,300 mg per day.  Getting at least 30-45 minutes of aerobic exercise at least 4 times per week.  Losing weight if necessary.  Not smoking.  Limiting alcoholic beverages.  Learning ways to reduce stress. Your health care provider may prescribe medicine if lifestyle changes are not enough  to get your blood pressure under control, and if one of the following is true:  You are 23-44 years of age and your systolic blood pressure is above 140.  You are 37 years of age or older, and your systolic blood pressure is above 150.  Your diastolic blood pressure is above 90.  You have diabetes, and your systolic blood pressure is over [id number] or your diastolic blood pressure is over 90.  You have kidney disease and your blood pressure is above 140/90.  You have heart disease and your blood pressure is above 140/90. Your personal target blood pressure may vary depending on your medical conditions, your age, and other factors. HOME CARE INSTRUCTIONS  Have your blood pressure rechecked as directed by your health care provider.   Take medicines only as directed by your health care provider. Follow the directions carefully. Blood pressure medicines must be taken as prescribed. The medicine does not work as well when you skip doses. Skipping doses  also puts you at risk for problems.  Do not smoke.   Monitor your blood pressure at home as directed by your health care provider. SEEK MEDICAL CARE IF:   You think you are having a reaction to medicines taken.  You have recurrent headaches or feel dizzy.  You have swelling in your ankles.  You have trouble with your vision. SEEK IMMEDIATE MEDICAL CARE IF:  You develop a severe headache or confusion.  You have unusual weakness, numbness, or feel faint.  You have severe chest or abdominal pain.  You vomit repeatedly.  You have trouble breathing. MAKE SURE YOU:   Understand these instructions.  Will watch your condition.  Will get help right away if you are not doing well or get worse.   This information is not intended to replace advice given to you by your health care provider. Make sure you discuss any questions you have with your health care provider.   Document Released: 05/05/2005 Document Revised: 09/19/2014 Document  Reviewed: 02/25/2013 Elsevier Interactive Patient Education 2016 [place] (Flu) Vaccine (Inactivated or Recombinant):  1. Why get vaccinated? Influenza ("flu") is a contagious disease that spreads around the [place] every year, usually between October and May. Flu is caused by influenza viruses, and is spread mainly by coughing, sneezing, and close contact. Anyone can get flu. Flu strikes suddenly and can last several days. Symptoms vary by age, but can include:  fever/chills  sore throat  muscle aches  fatigue  cough  headache  runny or stuffy nose Flu can also lead to pneumonia and blood infections, and cause diarrhea and seizures in children. If you have a medical condition, such as heart or lung disease, flu can make it worse. Flu is more dangerous for some people. Infants and young children, people 51 years of age and older, pregnant women, and people with certain health conditions or a weakened immune system are at greatest risk. Each year thousands of people in the [place] die from flu, and many more are hospitalized. Flu vaccine can:  keep you from getting flu,  make flu less severe if you do get it, and  keep you from spreading flu to your family and other people. 2. Inactivated and recombinant flu vaccines A dose of flu vaccine is recommended every flu season. Children 6 months through 20 years of age may need two doses during the same flu season. Everyone else needs only one dose each flu season. Some inactivated flu vaccines contain a very small amount of a mercury-based preservative called thimerosal. Studies have not shown thimerosal in vaccines to be harmful, but flu vaccines that do not contain thimerosal are available. There is no live flu virus in flu shots. They cannot cause the flu. There are many flu viruses, and they are always changing. Each year a new flu vaccine is made to protect against three or four viruses that are likely to  cause disease in the upcoming flu season. But even when the vaccine doesn't exactly match these viruses, it may still provide some protection. Flu vaccine cannot prevent:  flu that is caused by a virus not covered by the vaccine, or  illnesses that look like flu but are not. It takes about 2 weeks for protection to develop after vaccination, and protection lasts through the flu season. 3. Some people should not get this vaccine Tell the person who is giving you the vaccine:  If you have any severe, life-threatening allergies. If you ever had  a life-threatening allergic reaction after a dose of flu vaccine, or have a severe allergy to any part of this vaccine, you may be advised not to get vaccinated. Most, but not all, types of flu vaccine contain a small amount of egg protein.  If you ever had Guillain-Barre Syndrome (also called GBS). Some people with a history of GBS should not get this vaccine. This should be discussed with your doctor.  If you are not feeling well. It is usually okay to get flu vaccine when you have a mild illness, but you might be asked to come back when you feel better. 4. Risks of a vaccine reaction With any medicine, including vaccines, there is a chance of reactions. These are usually mild and go away on their own, but serious reactions are also possible. Most people who get a flu shot do not have any problems with it. Minor problems following a flu shot include:  soreness, redness, or swelling where the shot was given  hoarseness  sore, red or itchy eyes  cough  fever  aches  headache  itching  fatigue If these problems occur, they usually begin soon after the shot and last 1 or 2 days. More serious problems following a flu shot can include the following:  There may be a small increased risk of Guillain-Barre Syndrome (GBS) after inactivated flu vaccine. This risk has been estimated at 1 or 2 additional cases per million people vaccinated. This is  much lower than the risk of severe complications from flu, which can be prevented by flu vaccine.  Young children who get the flu shot along with pneumococcal vaccine (PCV13) and/or DTaP vaccine at the same time might be slightly more likely to have a seizure caused by fever. Ask your doctor for more information. Tell your doctor if a child who is getting flu vaccine has ever had a seizure. Problems that could happen after any injected vaccine:  People sometimes faint after a medical procedure, including vaccination. Sitting or lying down for about 15 minutes can help prevent fainting, and injuries caused by a fall. Tell your doctor if you feel dizzy, or have vision changes or ringing in the ears.  Some people get severe pain in the shoulder and have difficulty moving the arm where a shot was given. This happens very rarely.  Any medication can cause a severe allergic reaction. Such reactions from a vaccine are very rare, estimated at about 1 in a million doses, and would happen within a few minutes to a few hours after the vaccination. As with any medicine, there is a very remote chance of a vaccine causing a serious injury or death. The safety of vaccines is always being monitored. For more information, visit: http://www.aguilar.org/ 5. What if there is a serious reaction? What should I look for?  Look for anything that concerns you, such as signs of a severe allergic reaction, very high fever, or unusual behavior. Signs of a severe allergic reaction can include hives, swelling of the face and throat, difficulty breathing, a fast heartbeat, dizziness, and weakness. These would start a few minutes to a few hours after the vaccination. What should I do?  If you think it is a severe allergic reaction or other emergency that can't wait, call 9-1-1 and get the person to the nearest hospital. Otherwise, call your doctor.  Reactions should be reported to the Vaccine Adverse Event Reporting System  (VAERS). Your doctor should file this report, or you can do it yourself  through the VAERS web site at www.vaers.SamedayNews.es, or by calling (272)411-7477. VAERS does not give medical advice. 6. The National Vaccine Injury Compensation Program The [employer] Vaccine Injury Compensation Program (VICP) is a federal program that was created to compensate people who may have been injured by certain vaccines. Persons who believe they may have been injured by a vaccine can learn about the program and about filing a claim by calling 9392031211 or visiting the [place] website at GoldCloset.com.ee. There is a time limit to file a claim for compensation. 7. How can I learn more?  Ask your healthcare provider. He or she can give you the vaccine package insert or suggest other sources of information.  Call your local or state health department.  Contact the Centers for Disease Control and Prevention (CDC):  Call (317)086-0248 (1-800-CDC-INFO) or  Visit CDC's website at https://gibson.com/ Vaccine Information Statement Inactivated Influenza Vaccine (12/23/2013)   This information is not intended to replace advice given to you by your health care provider. Make sure you discuss any questions you have with your health care provider.   Document Released: 02/27/2006 Document Revised: 05/26/2014 Document Reviewed: 12/26/2013 Elsevier Interactive Patient Education [employer].

## 2016-02-07 NOTE — Progress Notes (Signed)
Patient is here for FU  Patient denies pain at this time.  Patient has taken medication today and patient has eaten today.  Patient would like the flu vaccine today. Patient tolerated flu injection well today.

## 2016-02-07 NOTE — Progress Notes (Signed)
Todd Ferguson, is a 75 y.o. male  [id number]  [id number]  DOB - 1940-05-26  Chief Complaint  Patient presents with  . Follow-up        Subjective:   Todd Ferguson is a 75 y.o. male with medical history of hypertension, prediabetes and dyslipidemia here today for a follow up visit. He has no new complaints today, compliant with medications, reports no side effect. Patient is compliant with medications, reports no side effects. He has no complaint today. He continues to smoke about 5 - 6 cigarettes per day. He denies any pain. He has no headache, no fever. 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 He denies chest pain. He denies being depressed. No abdominal pain - No Nausea, No new weakness tingling or numbness, No Cough - SOB.  Problem  Healthcare Maintenance    ALLERGIES: No Known Allergies  PAST MEDICAL HISTORY: Past Medical History:  Diagnosis Date  . 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  . Hypertension     MEDICATIONS AT HOME: Prior to Admission medications   Medication Sig Start Date End Date Taking? Authorizing Provider  amLODipine (NORVASC) 10 MG tablet Take 1 tablet (10 mg total) by mouth daily. 09/06/15  Yes Tresa Garter, MD  aspirin EC 81 MG tablet Take 1 tablet (81 mg total) by mouth daily. 05/10/13  Yes Reyne Dumas, MD  hydrochlorothiazide (HYDRODIURIL) 25 MG tablet Take 1 tablet (25 mg total) by mouth daily. 09/06/15  Yes Tresa Garter, MD  lisinopril (PRINIVIL,ZESTRIL) 40 MG tablet Take 1 tablet (40 mg total) by mouth daily. 09/06/15  Yes Tresa Garter, MD   Objective:   Vitals:   02/07/16 1214  BP: 125/73  Pulse: 79  Resp: 18  Temp: 98.5 F (36.9 C)  TempSrc: Oral  SpO2: 95%  Weight: 200 lb 6.4 oz (90.9 kg)  Height: 5\' 4"  (1.626 m)   Exam General appearance : Awake, alert,  not in any distress. Speech Clear. Not toxic looking, Prosthetic right eye  HEENT: Atraumatic and Normocephalic, pupils equally reactive to light and accomodation Neck: Supple, no JVD. No cervical lymphadenopathy.  Chest: Good air entry bilaterally, no added sounds  CVS: S1 S2 regular, no murmurs.  Abdomen: Bowel sounds present, Non tender and not distended with no gaurding, rigidity or rebound. Extremities: B/L Lower Ext shows no edema, both legs are warm to touch Neurology: Awake alert, and oriented X 3, CN III -XII intact, Non focal Skin: No Rash  Data Review Lab Results  Component Value Date   HGBA1C 5.9 07/25/2015   HGBA1C 6.4 08/07/2014   HGBA1C 6.1 (H) 10/28/2013     Assessment & Plan   1. Healthcare maintenance  - Flu Vaccine QUAD 36+ mos PF IM (Fluarix & Fluzone Quad PF)  2. Essential hypertension, benign: Controlled  - COMPLETE METABOLIC PANEL WITH GFR  We have discussed target BP range and blood pressure goal. I have advised patient to check BP regularly and to call us back or report to clinic if the numbers are consistently higher than 140/90. We discussed the importance of compliance with medical therapy and DASH diet recommended, consequences of uncontrolled hypertension discussed.  - continue current BP medications  3. Smoking  [place] was counseled on the dangers of tobacco use, and was advised to quit. Reviewed  strategies to maximize success, including removing cigarettes and smoking materials from environment, stress management and support of family/friends.  Patient have been counseled extensively about nutrition and exercise  Return in about 6 months (around 08/06/2016) for Annual Physical, Follow up HTN.  The patient was given clear instructions to go to ER or return to medical center if symptoms don't improve, worsen or new problems develop. The patient verbalized understanding. The patient was told to call to get lab results if they haven't heard  anything in the next week.   This note has been created with Surveyor, quantity. Any transcriptional errors are unintentional.    Angelica Chessman, MD, [place], [place], [place], [place], [place]   02/07/2016, 12:32 PM

## 2016-02-18 ENCOUNTER — Telehealth: Payer: Self-pay | Admitting: *Deleted

## 2016-02-18 NOTE — Telephone Encounter (Signed)
-----   Message from Tresa Garter, MD sent at 02/08/2016  5:17 PM EDT ----- Please inform patient that his kidney function has improved a bit, it is very important to maintain normal blood pressure to prevent progression of chronic kidney disease. Also avoid OTC NSAIDs like ibuprofen and aleve. Be sure to stay well hydrated. Follow up as scheduled.

## 2016-02-18 NOTE — Telephone Encounter (Signed)
Medical Assistant left message on patient's home and cell voicemail. Voicemail states to give a call back to Mekia Dipinto with [facility] at [phone].  

## 2016-02-19 ENCOUNTER — Telehealth: Payer: Self-pay | Admitting: Internal Medicine

## 2016-02-19 NOTE — Telephone Encounter (Signed)
Pt called returning nurse's call to review results.

## 2016-02-19 NOTE — Telephone Encounter (Signed)
Pt called back to review results. Was informed turnaround is 24-48hrs

## 2016-02-20 NOTE — Telephone Encounter (Signed)
Patient verified DOB Patient is aware of kidney function showing a bit of improvement. Patient is advised to avoid OTC NSAID's like ibuprofen or aleve. Patient also advised to continue taking BP medication as prescribed to avoid further damage to his kidneys. Patient expressed his understanding and had no further questions at this time.

## 2016-02-27 MED FILL — AMLODIPINE BESYLATE 10 MG T: 10 | 30 days supply | Qty: 30 | Fill #3

## 2016-02-27 MED FILL — ?LISINOPRIL 40 MG TABLET: 40 MG | 30 days supply | Qty: 30 | Fill #3

## 2016-02-27 MED FILL — HYDROCHLOROTHIAZIDE 25 MG T: 25 | 30 days supply | Qty: 30 | Fill #3

## 2016-03-26 MED FILL — AMLODIPINE BESYLATE 10 MG T: 10 | 30 days supply | Qty: 30 | Fill #4

## 2016-03-26 MED FILL — HYDROCHLOROTHIAZIDE 25 MG T: 25 | 30 days supply | Qty: 30 | Fill #4

## 2016-03-26 MED FILL — LISINOPRIL 40 MG TABLET: 40 | 30 days supply | Qty: 30 | Fill #4

## 2016-04-29 MED FILL — AMLODIPINE BESYLATE 10 MG T: 10 | 30 days supply | Qty: 30 | Fill #5

## 2016-04-29 MED FILL — HYDROCHLOROTHIAZIDE 25 MG T: 25 | 30 days supply | Qty: 30 | Fill #5

## 2016-04-29 MED FILL — LISINOPRIL 40 MG TABLET: 40 | 30 days supply | Qty: 30 | Fill #5

## 2016-05-20 ENCOUNTER — Encounter (HOSPITAL_COMMUNITY): Payer: Self-pay | Admitting: *Deleted

## 2016-05-20 DIAGNOSIS — R10819 Abdominal tenderness, unspecified site: Secondary | ICD-10-CM | POA: Diagnosis not present

## 2016-05-20 DIAGNOSIS — R52 Pain, unspecified: Secondary | ICD-10-CM | POA: Diagnosis not present

## 2016-05-20 DIAGNOSIS — I10 Essential (primary) hypertension: Secondary | ICD-10-CM | POA: Diagnosis not present

## 2016-05-20 DIAGNOSIS — R109 Unspecified abdominal pain: Secondary | ICD-10-CM | POA: Diagnosis present

## 2016-05-20 DIAGNOSIS — F17210 Nicotine dependence, cigarettes, uncomplicated: Secondary | ICD-10-CM | POA: Diagnosis not present

## 2016-05-20 DIAGNOSIS — K8020 Calculus of gallbladder without cholecystitis without obstruction: Secondary | ICD-10-CM | POA: Diagnosis not present

## 2016-05-20 DIAGNOSIS — Z7982 Long term (current) use of aspirin: Secondary | ICD-10-CM | POA: Diagnosis not present

## 2016-05-20 LAB — URINALYSIS, ROUTINE W REFLEX MICROSCOPIC
BILIRUBIN URINE: NEGATIVE
GLUCOSE, UA: NEGATIVE mg/dL
HGB URINE DIPSTICK: NEGATIVE
Ketones, ur: NEGATIVE mg/dL
Leukocytes, UA: NEGATIVE
Nitrite: NEGATIVE
PH: 5 (ref 5.0–8.0)
Protein, ur: NEGATIVE mg/dL
SPECIFIC GRAVITY, URINE: 1.017 (ref 1.005–1.030)

## 2016-05-20 NOTE — ED Triage Notes (Signed)
Pt is here by ems from home for sudden onset upper abdominal pain that began 5:30pm after drinking a soda.  Pt denies CP and it is not epigastric pain.  No N/v with this.  Pt has had some belching with this.

## 2016-05-21 ENCOUNTER — Emergency Department (HOSPITAL_COMMUNITY): Payer: Medicare Other

## 2016-05-21 ENCOUNTER — Emergency Department (HOSPITAL_COMMUNITY)
Admission: EM | Admit: 2016-05-21 | Discharge: 2016-05-21 | Disposition: A | Discharge status: Home / Self Care | Payer: Medicare Other | Attending: Emergency Medicine | Admitting: Emergency Medicine

## 2016-05-21 DIAGNOSIS — K8020 Calculus of gallbladder without cholecystitis without obstruction: Secondary | ICD-10-CM | POA: Diagnosis not present

## 2016-05-21 LAB — CBC WITH DIFFERENTIAL/PLATELET
BASOS ABS: 0 10*3/uL (ref 0.0–0.1)
BASOS PCT: 0 %
EOS PCT: 1 %
Eosinophils Absolute: 0.1 10*3/uL (ref 0.0–0.7)
HCT: 46.7 % (ref 39.0–52.0)
Hemoglobin: 15.8 g/dL (ref 13.0–17.0)
LYMPHS PCT: 35 %
Lymphs Abs: 2.8 10*3/uL (ref 0.7–4.0)
MCH: 30.6 pg (ref 26.0–34.0)
MCHC: 33.8 g/dL (ref 30.0–36.0)
MCV: 90.5 fL (ref 78.0–100.0)
MONO ABS: 0.4 10*3/uL (ref 0.1–1.0)
Monocytes Relative: 5 %
NEUTROS ABS: 4.7 10*3/uL (ref 1.7–7.7)
Neutrophils Relative %: 59 %
PLATELETS: 272 10*3/uL (ref 150–400)
RBC: 5.16 MIL/uL (ref 4.22–5.81)
RDW: 13.7 % (ref 11.5–15.5)
WBC: 8 10*3/uL (ref 4.0–10.5)

## 2016-05-21 LAB — COMPREHENSIVE METABOLIC PANEL
ALBUMIN: 4.1 g/dL (ref 3.5–5.0)
ALT: 127 U/L — AB (ref 17–63)
AST: 316 U/L — AB (ref 15–41)
Alkaline Phosphatase: 91 U/L (ref 38–126)
Anion gap: 14 (ref 5–15)
BUN: 12 mg/dL (ref 6–20)
CHLORIDE: 100 mmol/L — AB (ref 101–111)
CO2: 25 mmol/L (ref 22–32)
CREATININE: 1.17 mg/dL (ref 0.61–1.24)
Calcium: 9.9 mg/dL (ref 8.9–10.3)
GFR calc Af Amer: >60 mL/min (ref >60)
GLUCOSE: 135 mg/dL — AB (ref 65–99)
POTASSIUM: 3.9 mmol/L (ref 3.5–5.1)
SODIUM: 139 mmol/L (ref 135–145)
Total Bilirubin: 1.2 mg/dL (ref 0.3–1.2)
Total Protein: 7.7 g/dL (ref 6.5–8.1)

## 2016-05-21 LAB — LIPASE, BLOOD: LIPASE: 36 U/L (ref 11–51)

## 2016-05-21 MED ORDER — GI COCKTAIL ~~LOC~~
30.0000 mL | Freq: Once | ORAL | Status: AC
  Administered 2016-05-21: 30 mL via ORAL
  Filled 2016-05-21: qty 30

## 2016-05-21 NOTE — ED Notes (Signed)
Patient transported to Ultrasound 

## 2016-05-21 NOTE — ED Provider Notes (Addendum)
Todd Ferguson Provider Note   CSN: [id number] Arrival date & time: 05/20/16  2255     History   Chief Complaint Chief Complaint  Patient presents with  . Abdominal Pain    HPI Todd Ferguson is a 76 y.o. male.  HPI Pt comes in with cc of abd pain. Pt reports that he started having crampy pain around 5:30pm. Pt's pain was located all around his abdomen. Pain was constant and steady, and got better once he arrived. Patient has been pain free since the last 2-3 hours. Pt had a pop before the pain started. Pt has no uti like symptoms. Pt is s/p appendectomy. No associated n/v/diarrhea.   Past Medical History:  Diagnosis Date  . 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  . Hypertension     Patient Active Problem List   Diagnosis Date Noted  . Healthcare maintenance 02/07/2016  . Gout involving toe of right foot 07/25/2015  . 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 Smoking 08/07/2014  . Prostate cancer screening 08/07/2014  . Colon cancer screening 08/07/2014  . Prediabetes 08/07/2014  . Special screening for malignant neoplasms, colon 01/19/2014  . Encounter for abdominal aortic aneurysm screening 01/19/2014  . Essential hypertension, benign 07/11/2013  . Hypertriglyceridemia 07/11/2013  . Unspecified vitamin D deficiency 07/11/2013    Past Surgical History:  Procedure Laterality Date  . APPENDECTOMY    . DENTAL SURGERY    . Removed right eye         Home Medications    Prior to Admission medications   Medication Sig Start Date End Date Taking? Authorizing Provider  amLODipine (NORVASC) 10 MG tablet Take 1 tablet (10 mg total) by mouth daily. 09/06/15  Yes Tresa Garter, MD  aspirin EC 81 MG tablet Take 1 tablet (81 mg total) by mouth daily. 05/10/13  Yes Reyne Dumas, MD  hydrochlorothiazide (HYDRODIURIL) 25 MG tablet Take 1 tablet (25 mg total) by mouth daily. 09/06/15  Yes  Tresa Garter, MD  lisinopril (PRINIVIL,ZESTRIL) 40 MG tablet Take 1 tablet (40 mg total) by mouth daily. 09/06/15  Yes Tresa Garter, MD    Family History Family History  Problem Relation Age of Onset  . Diabetes Brother   . Heart disease Mother   . Kidney disease Mother   . Colon cancer Neg Hx   . Esophageal cancer Neg Hx   . Rectal cancer Neg Hx   . Stomach cancer Neg Hx   . Prostate cancer Neg Hx   . Pancreatic cancer Neg Hx     Social History Social History  Substance Use Topics  . Smoking status: Current Every Day Smoker    Packs/day: 0.25    Types: Cigarettes  . Smokeless tobacco: Never Used  . Alcohol use Yes     Comment: socially     Allergies   Patient has no known allergies.   Review of Systems Review of Systems  ROS 10 Systems reviewed and are negative for acute change except as noted in the HPI.     Physical Exam Updated Vital Signs BP 181/91   Pulse 65   Temp 98.4 F (36.9 C) (Oral)   Resp 18   Ht 5\' 4"  (1.626 m)   Wt 205 lb (93 kg)   SpO2 100%   BMI 35.19 kg/m   Physical Exam  Constitutional: He is oriented to  person, place, and time. He appears well-developed.  HENT:  Head: Normocephalic and atraumatic.  Eyes: Conjunctivae and EOM are normal. Pupils are equal, round, and reactive to light.  Neck: Normal range of motion. Neck supple.  Cardiovascular: Normal rate and regular rhythm.   Pulmonary/Chest: Effort normal and breath sounds normal.  Abdominal: Soft. Bowel sounds are normal. He exhibits no distension. There is tenderness. There is no rebound and no guarding.  ruq tenderness  Neurological: He is alert and oriented to person, place, and time.  Skin: Skin is warm.  Nursing note and vitals reviewed.    ED Treatments / Results  Labs (all labs ordered are listed, but only abnormal results are displayed) Labs Reviewed  COMPREHENSIVE METABOLIC PANEL - Abnormal; Notable for the following:       Result Value    Chloride 100 (*)    Glucose, Bld 135 (*)    AST 316 (*)    ALT 127 (*)    All other components within normal limits  CBC WITH DIFFERENTIAL/PLATELET  URINALYSIS, ROUTINE W REFLEX MICROSCOPIC  LIPASE, BLOOD    EKG  EKG Interpretation None       Radiology US Abdomen Limited  Result Date: 05/21/2016 CLINICAL DATA:  Epigastric pain for 1 day.  Elevated LFTs. EXAM: US ABDOMEN LIMITED - RIGHT UPPER QUADRANT COMPARISON:  None. FINDINGS: Gallbladder: Multiple calculi, measuring up to 1.5 cm. No gallbladder mural thickening or pericholecystic fluid. The patient was not tender over the gallbladder. Common bile duct: Diameter: 3.7 mm Liver: No focal lesion identified. Within normal limits in parenchymal echogenicity. IMPRESSION: Cholelithiasis without sonographic evidence of acute cholecystitis. Electronically Signed   By: Andreas Newport M.D.   On: 05/21/2016 06:41    Procedures Procedures (including critical care time)  Medications Ordered in ED Medications  gi cocktail (Maalox,Lidocaine,Donnatal) (30 mLs Oral Given 05/21/16 [id number])     Initial Impression / Assessment and Plan / ED Course  I have reviewed the triage vital signs and the nursing notes.  Pertinent labs & imaging results that were available during my care of the patient were reviewed by me and considered in my medical decision making (see chart for details).  Clinical Course     Pt has abd pain - RUQ on exam. Initially when he had the pain it was generalized. LFTs slightly elevated. [place] ordered. Lipase is normal   Final Clinical Impressions(s) / ED Diagnoses   Final diagnoses:  Calculus of gallbladder without cholecystitis without obstruction    New Prescriptions New Prescriptions   No medications on file     Varney Biles, MD 05/21/16 [place], MD 05/21/16 [phone]

## 2016-05-21 NOTE — Discharge Instructions (Signed)
Ultrasound shows gallstones. See the Surgeon as requested. Do not eat fried food, cheese for now. Also see the GI doctor for slightly elevated liver enzymes.  Come to the ER if the pain gets unbearable.

## 2016-05-21 NOTE — ED Notes (Signed)
Patients wife called wanting to know why he was waiting in the lobby since he came by EMS and has been waiting for 3 hours.  Explained that we are moving patients as fast as possible and we have been re evaluating him.  Wife hung up on this nurse

## 2016-05-26 MED FILL — ?LISINOPRIL 40 MG TABLET: 40 MG | 30 days supply | Qty: 30 | Fill #6

## 2016-05-26 MED FILL — HYDROCHLOROTHIAZIDE 25 MG T: 25 | 30 days supply | Qty: 30 | Fill #6

## 2016-05-26 MED FILL — AMLODIPINE BESYLATE 10 MG T: 10 | 30 days supply | Qty: 30 | Fill #6

## 2016-07-01 MED FILL — AMLODIPINE BESYLATE 10 MG T: 10 | 30 days supply | Qty: 30 | Fill #7

## 2016-07-01 MED FILL — ?LISINOPRIL 40 MG TABLET: 40 MG | 30 days supply | Qty: 30 | Fill #7

## 2016-07-01 MED FILL — HYDROCHLOROTHIAZIDE 25 MG T: 25 | 30 days supply | Qty: 30 | Fill #7

## 2016-07-17 ENCOUNTER — Emergency Department (HOSPITAL_COMMUNITY): Payer: Medicare Other

## 2016-07-17 ENCOUNTER — Observation Stay (HOSPITAL_COMMUNITY)
Admission: EM | Admit: 2016-07-17 | Discharge: 2016-07-18 | Disposition: A | Discharge status: Home / Self Care | Payer: Medicare Other | Attending: Internal Medicine | Admitting: Internal Medicine

## 2016-07-17 ENCOUNTER — Encounter (HOSPITAL_COMMUNITY): Payer: Self-pay

## 2016-07-17 DIAGNOSIS — Z79899 Other long term (current) drug therapy: Secondary | ICD-10-CM | POA: Insufficient documentation

## 2016-07-17 DIAGNOSIS — R072 Precordial pain: Secondary | ICD-10-CM | POA: Diagnosis not present

## 2016-07-17 DIAGNOSIS — R079 Chest pain, unspecified: Secondary | ICD-10-CM | POA: Diagnosis present

## 2016-07-17 DIAGNOSIS — Z7982 Long term (current) use of aspirin: Secondary | ICD-10-CM | POA: Diagnosis not present

## 2016-07-17 DIAGNOSIS — F17210 Nicotine dependence, cigarettes, uncomplicated: Secondary | ICD-10-CM | POA: Insufficient documentation

## 2016-07-17 DIAGNOSIS — M7989 Other specified soft tissue disorders: Secondary | ICD-10-CM | POA: Insufficient documentation

## 2016-07-17 DIAGNOSIS — F17200 Nicotine dependence, unspecified, uncomplicated: Secondary | ICD-10-CM | POA: Diagnosis present

## 2016-07-17 DIAGNOSIS — I10 Essential (primary) hypertension: Secondary | ICD-10-CM | POA: Diagnosis not present

## 2016-07-17 DIAGNOSIS — R0789 Other chest pain: Secondary | ICD-10-CM | POA: Diagnosis not present

## 2016-07-17 HISTORY — DX: Tobacco use: Z72.0

## 2016-07-17 HISTORY — DX: Bradycardia, unspecified: R00.1

## 2016-07-17 LAB — BASIC METABOLIC PANEL
Anion gap: 7 (ref 5–15)
BUN: 8 mg/dL (ref 6–20)
CALCIUM: 9 mg/dL (ref 8.9–10.3)
CHLORIDE: 108 mmol/L (ref 101–111)
CO2: 22 mmol/L (ref 22–32)
CREATININE: 1.14 mg/dL (ref 0.61–1.24)
GFR calc Af Amer: >60 mL/min (ref >60)
GFR calc non Af Amer: >60 mL/min (ref >60)
GLUCOSE: 155 mg/dL — AB (ref 65–99)
Potassium: 3.9 mmol/L (ref 3.5–5.1)
Sodium: 137 mmol/L (ref 135–145)

## 2016-07-17 LAB — URIC ACID: Uric Acid, Serum: 7.6 mg/dL (ref 4.4–7.6)

## 2016-07-17 LAB — TROPONIN I: Troponin I: <0.03 ng/mL (ref <0.03)

## 2016-07-17 LAB — SEDIMENTATION RATE: SED RATE: 17 mm/h — AB (ref 0–16)

## 2016-07-17 LAB — CBC
HCT: 40.7 % (ref 39.0–52.0)
Hemoglobin: 13.6 g/dL (ref 13.0–17.0)
MCH: 29.4 pg (ref 26.0–34.0)
MCHC: 33.4 g/dL (ref 30.0–36.0)
MCV: 88.1 fL (ref 78.0–100.0)
PLATELETS: 259 10*3/uL (ref 150–400)
RBC: 4.62 MIL/uL (ref 4.22–5.81)
RDW: 13.6 % (ref 11.5–15.5)
WBC: 6.4 10*3/uL (ref 4.0–10.5)

## 2016-07-17 LAB — I-STAT TROPONIN, ED: TROPONIN I, POC: 0 ng/mL (ref 0.00–0.08)

## 2016-07-17 MED ORDER — SODIUM CHLORIDE 0.9% FLUSH: 3.0000 mL | INTRAVENOUS | Status: DC | PRN

## 2016-07-17 MED ORDER — ONDANSETRON HCL 4 MG/2ML IJ SOLN: 4.0000 mg | Freq: Four times a day (QID) | INTRAMUSCULAR | Status: DC | PRN

## 2016-07-17 MED ORDER — ASPIRIN EC 81 MG PO TBEC
81.0000 mg | DELAYED_RELEASE_TABLET | Freq: Every day | ORAL | Status: DC
Start: 2016-07-18 — End: 2016-07-18
  Administered 2016-07-18: 81 mg via ORAL
  Filled 2016-07-17: qty 1

## 2016-07-17 MED ORDER — ASPIRIN 81 MG PO CHEW
324.0000 mg | CHEWABLE_TABLET | Freq: Once | ORAL | Status: AC
  Administered 2016-07-17: 324 mg via ORAL
  Filled 2016-07-17: qty 4

## 2016-07-17 MED ORDER — ASPIRIN EC 81 MG PO TBEC: 81.0000 mg | DELAYED_RELEASE_TABLET | Freq: Every day | ORAL | Status: DC

## 2016-07-17 MED ORDER — SODIUM CHLORIDE 0.9% FLUSH: 3.0000 mL | Freq: Two times a day (BID) | INTRAVENOUS | Status: DC

## 2016-07-17 MED ORDER — ACETAMINOPHEN 325 MG PO TABS: 650.0000 mg | ORAL_TABLET | ORAL | Status: DC | PRN

## 2016-07-17 MED ORDER — ENOXAPARIN SODIUM 40 MG/0.4ML ~~LOC~~ SOLN: 40.0000 mg | SUBCUTANEOUS | Status: DC

## 2016-07-17 MED ORDER — SODIUM CHLORIDE 0.9 % IV SOLN: 250.0000 mL | INTRAVENOUS | Status: DC | PRN

## 2016-07-17 MED ORDER — NITROGLYCERIN 0.4 MG SL SUBL: 0.4000 mg | SUBLINGUAL_TABLET | SUBLINGUAL | Status: DC | PRN

## 2016-07-17 MED ORDER — HYDROCHLOROTHIAZIDE 25 MG PO TABS
25.0000 mg | ORAL_TABLET | Freq: Every day | ORAL | Status: DC
  Administered 2016-07-18: 25 mg via ORAL
  Filled 2016-07-17: qty 1

## 2016-07-17 MED ORDER — LISINOPRIL 40 MG PO TABS
40.0000 mg | ORAL_TABLET | Freq: Every day | ORAL | Status: DC
  Administered 2016-07-18: 40 mg via ORAL
  Filled 2016-07-17: qty 1

## 2016-07-17 NOTE — ED Notes (Signed)
Wife leaving bedside to attend to personal errands; states she will return

## 2016-07-17 NOTE — ED Provider Notes (Signed)
Patient not in room   [name], MD 07/17/16 1757

## 2016-07-17 NOTE — ED Triage Notes (Addendum)
Pt reports chest pain that began 2 days ago, no cardiac hx. He reports recent cold symptoms including cough.  He also reports left hand swelling. He denies injury. He reports hx of gout in his foot. Strong radial pulse noted.

## 2016-07-17 NOTE — H&P (Signed)
History and Physical    Todd Ferguson [id number] DOB: [birth year] DOA: 07/17/2016  PCP: Angelica Chessman, MD   Patient coming from: Home  Chief Complaint: Chest pain   HPI: Todd Ferguson is a 76 y.o. male with medical history significant for hypertension and tobacco abuse who presents the emergency department for evaluation of chest pain. Patient reports that he had developed URI symptoms with a cough, but no fevers or chills, approximately 2 weeks ago and developed some intermittent chest pain shortly after that. He reports that over this interval, the URI symptoms have been improving except for the persistent cough, but the chest pain has persisted and was more severe today. Patient reports episodes of substernal chest pressure, occurring in different situations, lasting approximately 20 minutes before easing off, occurring several times today, without radiation, and without any clear alleviating or exacerbating factors. Patient denies any associated nausea, diaphoresis, or dyspnea. Prior to 2 weeks ago, he had not experienced these symptoms before. Patient denies any association with food or activity. He denies any lower extremity swelling or tenderness and denies shortness of breath. He takes a daily aspirin 81 mg for primary prophylaxis. Patient continues to smoke cigarettes, but has cut back to less than 1 pack per day now. Patient is also noted swelling to the dorsal aspect of his left hand, developing over the past couple days without preceding trauma, without fevers or chills, without significant tenderness, and with no overlying discoloration or drainage. He has difficulty clenching his fist secondary to this.  ED Course: Upon arrival to the ED, patient is found to be afebrile, saturating well on room air, and with vital signs otherwise stable. EKG features a sinus rhythm with PVCs and a septal Q-wave. Chest x-ray is negative for acute cardiopulmonary disease and chemistry panel is  unremarkable. CBC is also unremarkable and troponin is undetectable. 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 Aspirin 324 mg was administered in the emergency department. A chest pain eased off spontaneously and there is been no recurrence while he is in the ED. patient remained hemodynamically stable and in no apparent respiratory distress and will be observed on the telemetry unit for ongoing evaluation and management of chest pain with both typical and atypical features, and with initial evaluation reassuring.  Review of Systems:  All other systems reviewed and apart from HPI, are negative.  Past Medical History:  Diagnosis Date  . 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  . Hypertension     Past Surgical History:  Procedure Laterality Date  . APPENDECTOMY    . DENTAL SURGERY    . Removed right eye       reports that he has been smoking Cigarettes.  He has been smoking about 0.25 packs per day. He has never used smokeless tobacco. He reports that he drinks alcohol. He reports that he uses drugs, including Marijuana.  No Known Allergies  Family History  Problem Relation Age of Onset  . Diabetes Brother   . Heart disease Mother   . Kidney disease Mother   . Colon cancer Neg Hx   . Esophageal cancer Neg Hx   . Rectal cancer Neg Hx   . Stomach cancer Neg Hx   . Prostate cancer Neg Hx   .  Pancreatic cancer Neg Hx      Prior to Admission medications   Medication Sig Start Date End Date Taking? Authorizing Provider  amLODipine (NORVASC) 10 MG tablet Take 1 tablet (10 mg total) by mouth daily. 09/06/15  Yes Tresa Garter, MD  aspirin EC 81 MG tablet Take 1 tablet (81 mg total) by mouth daily. 05/10/13  Yes Reyne Dumas, MD    Aspirin-Salicylamide-Caffeine (BC HEADACHE POWDER PO) Take 1 packet by mouth daily as needed (for headaches).   Yes Historical Provider, MD  hydrochlorothiazide (HYDRODIURIL) 25 MG tablet Take 1 tablet (25 mg total) by mouth daily. 09/06/15  Yes Tresa Garter, MD  lisinopril (PRINIVIL,ZESTRIL) 40 MG tablet Take 1 tablet (40 mg total) by mouth daily. 09/06/15  Yes Tresa Garter, MD    Physical Exam: Vitals:   07/17/16 1930 07/17/16 2030 07/17/16 2045 07/17/16 2100  BP: 137/71 129/61 128/63 134/70  Pulse: 65 66 63 (!) 59  Resp: 24 26 23 21   Temp:      TempSrc:      SpO2: 96% 94% 95% 94%      Constitutional: NAD, calm, comfortable Eyes: Left eye surgically absent, right pupil round and reactive, lids and conjunctivae normal ENMT: Mucous membranes are moist. Posterior pharynx clear of any exudate or lesions.   Neck: normal, supple, no masses, no thyromegaly Respiratory: Mildly diminished breath sounds bilaterally, no wheezing, no crackles. Normal respiratory effort.   Cardiovascular: S1 & S2 heard, regular rate and rhythm. No extremity edema. No significant JVD. Abdomen: No distension, no tenderness, no masses palpated. Bowel sounds normal.  Musculoskeletal: no clubbing / cyanosis. No joint deformity upper and lower extremities. Normal muscle tone.  Skin: no significant rashes, lesions, ulcers. Warm, dry, well-perfused. Neurologic: CN 2-12 grossly intact. Sensation intact, DTR normal. Strength 5/5 in all 4 limbs.  Psychiatric: Normal judgment and insight. Alert and oriented x 3. Normal mood and affect.     Labs on Admission: I have personally reviewed following labs and imaging studies  CBC:  Recent Labs Lab 07/17/16 1358  WBC 6.4  HGB 13.6  HCT 40.7  MCV 88.1  PLT [id number]   Basic Metabolic Panel:  Recent Labs Lab 07/17/16 1358  NA 137  K 3.9  CL 108  CO2 22  GLUCOSE 155*  BUN 8  CREATININE 1.14  CALCIUM 9.0   GFR: CrCl cannot be calculated (Unknown  ideal weight.). Liver Function Tests: No results for input(s): AST, ALT, ALKPHOS, BILITOT, PROT, ALBUMIN in the last 168 hours. No results for input(s): LIPASE, AMYLASE in the last 168 hours. No results for input(s): AMMONIA in the last 168 hours. Coagulation Profile: No results for input(s): INR, PROTIME in the last 168 hours. Cardiac Enzymes: No results for input(s): CKTOTAL, CKMB, CKMBINDEX, TROPONINI in the last 168 hours. BNP (last 3 results) No results for input(s): PROBNP in the last 8760 hours. HbA1C: No results for input(s): HGBA1C in the last 72 hours. CBG: No results for input(s): GLUCAP in the last 168 hours. Lipid Profile: No results for input(s): CHOL, HDL, LDLCALC, TRIG, CHOLHDL, LDLDIRECT in the last 72 hours. Thyroid Function Tests: No results for input(s): TSH, T4TOTAL, FREET4, T3FREE, THYROIDAB in the last 72 hours. Anemia Panel: No results for input(s): VITAMINB12, FOLATE, FERRITIN, TIBC, IRON, RETICCTPCT in the last 72 hours. Urine analysis:    Component Value Date/Time   COLORURINE YELLOW 05/20/2016 2318   APPEARANCEUR CLEAR 05/20/2016 2318   LABSPEC 1.017 05/20/2016 2318   PHURINE 5.0 05/20/2016  [place] 05/20/2016 2318   HGBUR NEGATIVE 05/20/2016 2318   BILIRUBINUR NEGATIVE 05/20/2016 2318   KETONESUR NEGATIVE 05/20/2016 2318   PROTEINUR NEGATIVE 05/20/2016 2318   UROBILINOGEN 1.0 01/18/2009 0101   NITRITE NEGATIVE 05/20/2016 2318   LEUKOCYTESUR NEGATIVE 05/20/2016 2318   Sepsis Labs: @LABRCNTIP (procalcitonin:4,lacticidven:4) )No results found for this or any previous visit (from the past 240 hour(s)).   Radiological Exams on Admission: Dg Chest 2 View  Result Date: 07/17/2016 CLINICAL DATA:  Chest pain beginning 2 days ago EXAM: CHEST  2 VIEW COMPARISON:  06/09/2014 FINDINGS: Heart and mediastinal contours are within normal limits. No focal opacities or effusions. No acute bony abnormality. IMPRESSION: No active cardiopulmonary  disease. Electronically Signed   By: Rolm Baptise M.D.   On: 07/17/2016 14:41   Dg Hand Complete Left  Result Date: 07/17/2016 CLINICAL DATA:  Soft tissue swelling EXAM: LEFT HAND - COMPLETE 3+ VIEW COMPARISON:  None. FINDINGS: Frontal, oblique, and lateral views were obtained. There is generalized soft tissue swelling. No soft tissue air. No fracture or dislocation identified. 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 Other joint spaces appear unremarkable. No erosive change or periostitis. IMPRESSION: Generalized soft tissue swelling. 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 No erosive change or periostitis. No fracture or dislocation. Electronically Signed   By: Lowella Grip III M.D.   On: 07/17/2016 14:42    EKG: Independently reviewed. Sinus rhythm, PVC's, septal Q-wave  Assessment/Plan  1. Chest pain  - Presents with intermittent CP, both typical and atypical features  - No known hx of CAD; risk factors include age, HTN, smoking  - Pain has resolved spontaneously by time of admission  - He was treated with ASA 324 mg in ED; he takes daily ASA 81 and lisinopril at home - Initial EKG does not appear to have any acute ischemic features and initial troponin is 0.00  - Plan to monitor on telemetry for ischemic changes, obtain serial troponin measurements, repeat EKG, and follow-up on cardiology recommendations    2. Hypertension  - BP mildly elevated on admission  - Managed at home with lisinopril, HCTZ, and Norvasc - Plan to continue lisinopril and HCTZ; hold Norvasc while ruling-out ACS  - Beta-blocker precluded by bradycardia    3. Left hand swelling - Pt reports painless edema developing to left hand over the past couple days  - There was no trauma or inciting event and the hand is not discolored or tender  - Plain radiographs notable for soft-tissue swelling and mild osteoarthritic changes to 2nd PIP and DIP - May be secondary to  tight-fitting bracelet; advised removing the bracelet and elevating    4. Current smoker  - Counseled toward cessation; he has been cutting back  - Nicotine patch offered, declined     DVT prophylaxis: Sq Lovenox  Code Status: Full  Family Communication: Discussed with patient Disposition Plan: Observe on telemetry   Consults called: Cardiology  Admission status: Inpatient    Vianne Bulls, MD [facility] Pager [phone]  If 7PM-7AM, please contact night-coverage [email or web address] Password [email or web address]  07/17/2016, 9:15 PM

## 2016-07-17 NOTE — ED Notes (Signed)
[place], MD at bedside 

## 2016-07-17 NOTE — ED Provider Notes (Signed)
[facility] DEPT Provider Note   CSN: [id number] Arrival date & time: 07/17/16  1342     History   Chief Complaint Chief Complaint  Patient presents with  . Chest Pain  . Hand Pain    HPI Todd Ferguson is a 76 y.o. male.  HPI  76 y.o male complaining of 3 days of intermittent chest pain described as pressure. Pain is substernal and pressure in nature. It is nonradiating. It last up to 20 minutes several times a day. He is unable to give me any instigating events. He has had a recent history of diagnosis of gallstones. However, he denies any association with food, nausea, or vomiting. Denies history of any prior similar symptoms or evaluation for chest pain. He states he has had an upper respiratory infection for the past 2 weeks. This is consistent of nasal congestion with some cough productive of yellow-green sputum. He denies any fever or associated dyspnea.     Past Medical History:  Diagnosis Date  . 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  . Hypertension     Patient Active Problem List   Diagnosis Date Noted  . Healthcare maintenance 02/07/2016  . Gout involving toe of right foot 07/25/2015  . 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 Smoking 08/07/2014  . Prostate cancer screening 08/07/2014  . Colon cancer screening 08/07/2014  . Prediabetes 08/07/2014  . Special screening for malignant neoplasms, colon 01/19/2014  . Encounter for abdominal aortic aneurysm screening 01/19/2014  . Essential hypertension, benign 07/11/2013  . Hypertriglyceridemia 07/11/2013  . Unspecified vitamin D deficiency 07/11/2013    Past Surgical History:  Procedure Laterality Date  . APPENDECTOMY    . DENTAL SURGERY    . Removed right eye         Home Medications    Prior to Admission medications   Medication Sig Start Date End Date Taking? Authorizing Provider  amLODipine (NORVASC) 10 MG tablet Take 1  tablet (10 mg total) by mouth daily. 09/06/15  Yes Tresa Garter, MD  aspirin EC 81 MG tablet Take 1 tablet (81 mg total) by mouth daily. 05/10/13  Yes Reyne Dumas, MD  Aspirin-Salicylamide-Caffeine (BC HEADACHE POWDER PO) Take 1 packet by mouth daily as needed (for headaches).   Yes Historical Provider, MD  hydrochlorothiazide (HYDRODIURIL) 25 MG tablet Take 1 tablet (25 mg total) by mouth daily. 09/06/15  Yes Tresa Garter, MD  lisinopril (PRINIVIL,ZESTRIL) 40 MG tablet Take 1 tablet (40 mg total) by mouth daily. 09/06/15  Yes Tresa Garter, MD    Family History Family History  Problem Relation Age of Onset  . Diabetes Brother   . Heart disease Mother   . Kidney disease Mother   . Colon cancer Neg Hx   . Esophageal cancer Neg Hx   . Rectal cancer Neg Hx   . Stomach cancer Neg Hx   . Prostate cancer Neg Hx   . Pancreatic cancer Neg Hx     Social History Social History  Substance Use Topics  . Smoking status: Current Every Day Smoker    Packs/day: 0.25    Types: Cigarettes  . Smokeless tobacco: Never Used  . Alcohol use Yes     Comment: socially     Allergies   Patient has no known allergies.   Review of Systems Review of Systems  All other systems reviewed and are  negative.    Physical Exam Updated Vital Signs BP 129/66 (BP Location: Right Arm)   Pulse 66   Temp 98.7 F (37.1 C) (Oral)   Resp 24   SpO2 100%   Physical Exam  Constitutional: He is oriented to person, place, and time. He appears well-developed and well-nourished.  HENT:  Head: Normocephalic and atraumatic.  Right Ear: External ear normal.  Left Ear: External ear normal.  Nose: Nose normal.  Mouth/Throat: Oropharynx is clear and moist.  Eyes: Conjunctivae and EOM are normal. Pupils are equal, round, and reactive to light.  Neck: Normal range of motion. Neck supple.  Cardiovascular: Normal rate, regular rhythm, normal heart sounds and intact distal pulses.   Pulmonary/Chest:  Effort normal and breath sounds normal.  Abdominal: Soft. Bowel sounds are normal. There is no tenderness.  Musculoskeletal: Normal range of motion.  Left hand with some mild swelling.  Neurological: He is alert and oriented to person, place, and time. He has normal reflexes.  Skin: Skin is warm and dry.  Psychiatric: He has a normal mood and affect. His behavior is normal. Judgment and thought content normal.  Nursing note and vitals reviewed.    ED Treatments / Results  Labs (all labs ordered are listed, but only abnormal results are displayed) Labs Reviewed  BASIC METABOLIC PANEL - Abnormal; Notable for the following:       Result Value   Glucose, Bld 155 (*)    All other components within normal limits  CBC  I-STAT TROPOININ, ED    EKG  EKG Interpretation  Date/Time:  Thursday July 17 2016 13:50:31 EST Ventricular Rate:  88 PR Interval:  142 QRS Duration: 82 QT Interval:  344 QTC Calculation: 416 R Axis:   39 Text Interpretation:  Sinus rhythm with occasional Premature ventricular complexes Septal infarct , age undetermined Abnormal ECG Poor R wave progression new from first prior tracing Confirmed by Marquan Vokes MD, Andee Poles [id number]) on 07/17/2016 5:55:44 PM       Radiology Dg Chest 2 View  Result Date: 07/17/2016 CLINICAL DATA:  Chest pain beginning 2 days ago EXAM: CHEST  2 VIEW COMPARISON:  06/09/2014 FINDINGS: Heart and mediastinal contours are within normal limits. No focal opacities or effusions. No acute bony abnormality. IMPRESSION: No active cardiopulmonary disease. Electronically Signed   By: Rolm Baptise M.D.   On: 07/17/2016 14:41   Dg Hand Complete Left  Result Date: 07/17/2016 CLINICAL DATA:  Soft tissue swelling EXAM: LEFT HAND - COMPLETE 3+ VIEW COMPARISON:  None. FINDINGS: Frontal, oblique, and lateral views were obtained. There is generalized soft tissue swelling. No soft tissue air. No fracture or dislocation identified. There is mild osteoarthritic change in  the second PIP and DIP joints. Other joint spaces appear unremarkable. No erosive change or periostitis. IMPRESSION: Generalized soft tissue swelling. 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 No erosive change or periostitis. No fracture or dislocation. Electronically Signed   By: Lowella Grip III M.D.   On: 07/17/2016 14:42    Procedures Procedures (including critical care time)  Medications Ordered in ED Medications - No data to display   Initial Impression / Assessment and Plan / ED Course  I have reviewed the triage vital signs and the nursing notes.  Pertinent labs & imaging results that were available during my care of the patient were reviewed by me and considered in my medical decision making (see chart for details).     76 year old male with history of smoking,  hypertension who is concerning for cardiac ischemia. Patient with poor R-wave progression on EKG consistent with septal infarct which is new from previous EKG of 7/21. Plan admission to trend troponins and evaluate further for cardiac disease. Discussed with Dr. Myna Hidalgo he will place admission orders Final Clinical Impressions(s) / ED Diagnoses   Final diagnoses:  Chest pain, unspecified type    New Prescriptions New Prescriptions   No medications on file     [name], MD 07/17/16 2033

## 2016-07-18 ENCOUNTER — Observation Stay (HOSPITAL_COMMUNITY): Payer: Medicare Other

## 2016-07-18 ENCOUNTER — Encounter (HOSPITAL_COMMUNITY): Payer: Self-pay | Admitting: Physician Assistant

## 2016-07-18 DIAGNOSIS — R079 Chest pain, unspecified: Secondary | ICD-10-CM | POA: Diagnosis not present

## 2016-07-18 DIAGNOSIS — I10 Essential (primary) hypertension: Secondary | ICD-10-CM

## 2016-07-18 DIAGNOSIS — F17200 Nicotine dependence, unspecified, uncomplicated: Secondary | ICD-10-CM | POA: Diagnosis not present

## 2016-07-18 DIAGNOSIS — R072 Precordial pain: Secondary | ICD-10-CM | POA: Diagnosis not present

## 2016-07-18 LAB — EXERCISE TOLERANCE TEST
CSEPEDS: 4 s
CSEPPHR: 126 {beats}/min
Exercise duration (min): 6 min
Percent HR: 86 %
Rest HR: 61 {beats}/min

## 2016-07-18 LAB — LIPID PANEL
CHOL/HDL RATIO: 3.4 ratio
CHOLESTEROL: 164 mg/dL (ref 0–200)
HDL: 48 mg/dL (ref >40)
LDL Cholesterol: 104 mg/dL — ABNORMAL HIGH (ref 0–99)
TRIGLYCERIDES: 60 mg/dL (ref <150)
VLDL: 12 mg/dL (ref 0–40)

## 2016-07-18 LAB — TROPONIN I
Troponin I: <0.03 ng/mL (ref <0.03)
Troponin I: <0.03 ng/mL (ref <0.03)

## 2016-07-18 NOTE — Progress Notes (Signed)
Pt was waiting to get d/c home but he ended up leaving without signing his paperwork.  A copy of d/c papers were given to pt and he was clearly asked to wait a few minutes for this RN to return and go over the paperwork in detail.  Also, his IV was not removed by RN nor NT.  About 20 min later, pt was not seen in room and NT stated that he had left.  No signs of IV in trash cans in room, but Sharps container was not checked.  Wife was called so that she could ask pt to return to hospital so that we could d/c IV and address paperwork.  Wife stated that she spoke to him and pt  stated that he did remove his IV.   Pt was called to [phone] and left a detailed voicemail letting him know to return to hospital so that RN may address this.    Charge RN, Ebony Hail, aware of situation.  Dr. Sloan Leiter will be notified via text page.

## 2016-07-18 NOTE — Consult Note (Addendum)
CARDIOLOGY CONSULT NOTE   Patient ID: Todd Ferguson MRN: [id number] DOB/AGE: [birth year] 76 y.o.  Admit date: 07/17/2016  Primary Physician   Angelica Chessman, MD Primary Cardiologist   New to Dr. Percival Spanish Reason for Consultation   Chest pain Requesting Physician  Dr. Sloan Leiter  HPI: Todd Ferguson is a 76 y.o. 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  Patient was in usual state of health up until a few weeks ago when he developed a URI. He is still recovering. Currently coughing up yellow mucus.   He came to ER yesterday for evaluation of 3 days history of intermittent chest pain. Occurs with and without exertion. He described his pain as chest pressure at substernal area. It lasts for approximately 20 minutes and resolved by itself. No associated shortness of breath, nausea, vomiting, diaphoresis or radiation of pain. Denies history of acid reflux. No similar pain in the past. He also came for evaluation of left hand swelling.  History of tobacco abuse for greater than 50 pack year. Occasional nondrinker. Denies illicit drug use. Mother had a CABG at age 37.  Chest x-ray without acute cardiopulmonary disease. Sedimentation rate 17. LDL 104. Electrolyte and kidney function normal. Point of care troponin negative. Troponin  I x 2 negative. EKG shows normal sinus rhythm with PVC. No acute ischemic changes. He is chest pain free since admission.   Cxr of L hand: IMPRESSION: Generalized soft tissue swelling. 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 No erosive change or periostitis. No fracture or dislocation.  Past Medical History:  Diagnosis Date  . Bradycardia   . 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  . Hypertension   . Tobacco abuse      Past Surgical History:  Procedure  Laterality Date  . APPENDECTOMY    . DENTAL SURGERY    . Removed right eye      No Known Allergies  I have reviewed the patient's current medications . aspirin EC  81 mg Oral Daily  . enoxaparin (LOVENOX) injection  40 mg Subcutaneous Q24H  . hydrochlorothiazide  25 mg Oral Daily  . lisinopril  40 mg Oral Daily  . sodium chloride flush  3 mL Intravenous Q12H    sodium chloride, acetaminophen, nitroGLYCERIN, ondansetron (ZOFRAN) IV, sodium chloride flush  Prior to Admission medications   Medication Sig Start Date End Date Taking? Authorizing Provider  amLODipine (NORVASC) 10 MG tablet Take 1 tablet (10 mg total) by mouth daily. 09/06/15  Yes Tresa Garter, MD  aspirin EC 81 MG tablet Take 1 tablet (81 mg total) by mouth daily. 05/10/13  Yes Reyne Dumas, MD  Aspirin-Salicylamide-Caffeine (BC HEADACHE POWDER PO) Take 1 packet by mouth daily as needed (for headaches).   Yes Historical Provider, MD  hydrochlorothiazide (HYDRODIURIL) 25 MG tablet Take 1 tablet (25 mg total) by mouth daily. 09/06/15  Yes Tresa Garter, MD  lisinopril (PRINIVIL,ZESTRIL) 40 MG tablet Take 1 tablet (40 mg total) by mouth daily. 09/06/15  Yes Tresa Garter, MD     Social History   Social History  . Marital status: Married    Spouse name: N/A  . Number of children: N/A  . Years of education: N/A   Occupational History  . Not on file.   Social  History Main Topics  . Smoking status: Current Every Day Smoker    Packs/day: 0.25    Types: Cigarettes  . Smokeless tobacco: Never Used  . Alcohol use Yes     Comment: socially  . Drug use: Yes    Types: Marijuana     Comment: Marijuana about 1 week ago.  Marland Kitchen Sexual activity: Not on file   Other Topics Concern  . Not on file   Social History Narrative  . No narrative on file    Family Status  Relation Status  . Brother Deceased  . Mother Deceased  . Father Deceased  . Neg Hx    Family History  Problem Relation Age of Onset  .  Diabetes Brother   . Heart disease Mother   . Kidney disease Mother   . Colon cancer Neg Hx   . Esophageal cancer Neg Hx   . Rectal cancer Neg Hx   . Stomach cancer Neg Hx   . Prostate cancer Neg Hx   . Pancreatic cancer Neg Hx       ROS:  Full 14 point review of systems complete and found to be negative unless listed above.  Physical Exam: Blood pressure 130/71, pulse (!) 54, temperature 98.6 F (37 C), temperature source Oral, resp. rate 12, height 5\' 4"  (1.626 m), weight 195 lb 12.3 oz (88.8 kg), SpO2 94 %.  General: Well developed, well nourished, male in no acute distress Head: Eyes PERRLA, No xanthomas. R eye removal. Normocephalic and atraumatic, oropharynx without edema or exudate.  Lungs: Resp regular and unlabored, CTA. Heart: RRR no s3, s4, or murmurs..   Neck: No carotid bruits. No lymphadenopathy.  No JVD. Abdomen: Bowel sounds present, abdomen soft and non-tender without masses or hernias noted. Msk:  No spine or cva tenderness. No weakness, no joint deformities or effusions. Extremities: No clubbing, cyanosis or edema. DP/PT/Radials 2+ and equal bilaterally. L had with edema.  Neuro: Alert and oriented X 3. No focal deficits noted. Psych:  Good affect, responds appropriately Skin: No rashes or lesions noted.  Labs:   Lab Results  Component Value Date   WBC 6.4 07/17/2016   HGB 13.6 07/17/2016   HCT 40.7 07/17/2016   MCV 88.1 07/17/2016   PLT 259 07/17/2016   No results for input(s): INR in the last 72 hours.   Recent Labs Lab 07/17/16 1358  NA 137  K 3.9  CL 108  CO2 22  BUN 8  CREATININE 1.14  CALCIUM 9.0  GLUCOSE 155*   No results found for: MG  Recent Labs  07/17/16 2153 07/18/16 0335  TROPONINI <0.03 <0.03    Recent Labs  07/17/16 1419  TROPIPOC 0.00   No results found for: PROBNP Lab Results  Component Value Date   CHOL 164 07/18/2016   HDL 48 07/18/2016   LDLCALC 104 (H) 07/18/2016   TRIG 60 07/18/2016   No results found  for: DDIMER Lipase  Date/Time Value Ref Range Status  05/20/2016 11:28 PM 36 11 - 51 U/L Final   TSH  Date/Time Value Ref Range Status  04/29/2013 12:14 PM 1.098 0.350 - 4.500 uIU/mL Final    Radiology:  Dg Chest 2 View  Result Date: 07/17/2016 CLINICAL DATA:  Chest pain beginning 2 days ago EXAM: CHEST  2 VIEW COMPARISON:  06/09/2014 FINDINGS: Heart and mediastinal contours are within normal limits. No focal opacities or effusions. No acute bony abnormality. IMPRESSION: No active cardiopulmonary disease. Electronically Signed   By: Lennette Bihari  [place] M.D.   On: 07/17/2016 14:41   Dg Hand Complete Left  Result Date: 07/17/2016 CLINICAL DATA:  Soft tissue swelling EXAM: LEFT HAND - COMPLETE 3+ VIEW COMPARISON:  None. FINDINGS: Frontal, oblique, and lateral views were obtained. There is generalized soft tissue swelling. No soft tissue air. No fracture or dislocation identified. 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 Other joint spaces appear unremarkable. No erosive change or periostitis. IMPRESSION: Generalized soft tissue swelling. 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 No erosive change or periostitis. No fracture or dislocation. Electronically Signed   By: Lowella Grip III M.D.   On: 07/17/2016 14:42    Echo 03/08/15 Study Conclusions  - Left ventricle: The cavity size was normal. There was mild   concentric hypertrophy. Systolic function was normal. The   estimated ejection fraction was in the range of 55% to 60%. Wall   motion was normal; there were no regional wall motion   abnormalities.   ASSESSMENT AND PLAN:      1. Chest pain - Difficult to assess. Recovering for URI x 3 week. Intermittent chest pressure started 3 days ago. Pain free since admission. Troponin negative. EKG without acute changes. His cardiac risk factor includes HTN, ongoing tobacco abuse and family hx of CAD (Mother had CABG at age 76). Will review with MD  regarding inpatient vs outpatient stress test. EF was normal on echo 02/2015.  2. Tobacco smoking - Advised cessation. Education given.   SignedLeanor Kail, [place] 07/18/2016, 9:34 AM Pager [phone]  Co-Sign MD  History and all data above reviewed.  Patient examined.  I agree with the findings as above.  Chest pain with atypical greater than typical features.  No objective evidence of ischemia.   The patient exam reveals COR:RRR, no rub  ,  Lungs: Clear  ,  Abd: Positive bowel sounds, no rebound no guarding, Ext No edema  .  All available labs, radiology testing, previous records reviewed. Agree with documented assessment and plan. Chest pain:  Low HEART score.  OK to do POET (Plain Old Exercise Treadmill) and we will try to arrange to have this done today.  Todd Ferguson  11:21 AM  07/18/2016

## 2016-07-18 NOTE — Progress Notes (Signed)
   I have reviewed the POET (Plain Old Exercise Treadmill).  Negative adequate.  Non specific ST changes do not qualify for positive changes.  No further in paitent work up.

## 2016-07-18 NOTE — Discharge Summary (Signed)
PATIENT DETAILS Name: Todd Ferguson Age: 76 y.o. Sex: male Date of Birth: 05-Oct-1940 MRN: [id number]. Admitting Physician: Vianne Bulls, MD [id number], Gabrielle Dare, MD  Admit Date: 07/17/2016 Discharge date: 07/18/2016  Recommendations for Outpatient Follow-up:  1. Follow up with PCP in 1-2 weeks 2. 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  Brief Hospital Course: Atypical Chest pain: No further chest pain since admission, cardiac enzymes negative. Lying comfortably in bed this morning without any shortness of breath. Seen by cardiology, underwent excercise stress test that was negative. Cardiology not recommending any further work up.   Hypertension: Continue usual medications, follow with PCP for further optimization  Tobacco abuse: Claims he has cut down, but has no intention of quitting at this time.   Procedures/Studies: Exercise stress test>>neg  Discharge Diagnoses:  Principal Problem:   Chest pain Active Problems:   Essential hypertension, benign   Smoking   Swelling of left hand   Discharge Instructions:  Activity:  As tolerated with Full fall precautions use walker/cane & assistance as needed  Discharge Instructions    Diet - low sodium heart healthy    Complete by:  As directed    Discharge instructions    Complete by:  As directed    Follow with Primary MD  Angelica Chessman, MD in 1 week  STOP SMOKING  Please get a complete blood count and chemistry panel checked by your Primary MD at your next visit, and again as  instructed by your Primary MD.  Get Medicines reviewed and adjusted: Please take all your medications with you for your next visit with your Primary MD  Laboratory/radiological data: Please request your Primary MD to go over all hospital tests and procedure/radiological results at the follow up, please ask your Primary MD to get all Hospital records sent to his/her office.  In some cases, they will be blood work, cultures and biopsy results pending at the time of your discharge. Please request that your primary care M.D. follows up on these results.  Also Note the following: If you experience worsening of your admission symptoms, develop shortness of breath, life threatening emergency, suicidal or homicidal thoughts you must seek medical attention immediately by calling 911 or calling your MD immediately  if symptoms less severe.  You must read complete instructions/literature along with all the possible adverse reactions/side effects for all the Medicines you take and that have been prescribed to you. Take any new Medicines after you have completely understood and accpet all the possible adverse reactions/side effects.   Do not drive when taking Pain medications or sleeping medications (Benzodaizepines)  Do not take more than prescribed Pain, Sleep and Anxiety Medications. It is not advisable to combine anxiety,sleep and pain medications without talking with your primary care practitioner  Special Instructions: If you have smoked or chewed Tobacco  in the last 2 yrs please stop smoking, stop any regular Alcohol  and or any Recreational drug use.  Wear Seat belts while driving.  Please note: You were cared for by a hospitalist during your hospital  stay. Once you are discharged, your primary care physician will handle any further medical issues. Please note that NO REFILLS for any discharge medications will be authorized once you are discharged, as it is imperative that you return to your  primary care physician (or establish a relationship with a primary care physician if you do not have one) for your post hospital discharge needs so that they can reassess your need for medications and monitor your lab values.   Increase activity slowly    Complete by:  As directed      Allergies as of 07/18/2016   No Known Allergies     Medication List    STOP taking these medications   BC HEADACHE POWDER PO     TAKE these medications   amLODipine 10 MG tablet Commonly known as:  NORVASC Take 1 tablet (10 mg total) by mouth daily.   aspirin EC 81 MG tablet Take 1 tablet (81 mg total) by mouth daily.   hydrochlorothiazide 25 MG tablet Commonly known as:  HYDRODIURIL Take 1 tablet (25 mg total) by mouth daily.   lisinopril 40 MG tablet Commonly known as:  PRINIVIL,ZESTRIL Take 1 tablet (40 mg total) by mouth daily.       No Known Allergies  Consultations:   cardiology  Other Procedures/Studies: Dg Chest 2 View  Result Date: 07/17/2016 CLINICAL DATA:  Chest pain beginning 2 days ago EXAM: CHEST  2 VIEW COMPARISON:  06/09/2014 FINDINGS: Heart and mediastinal contours are within normal limits. No focal opacities or effusions. No acute bony abnormality. IMPRESSION: No active cardiopulmonary disease. Electronically Signed   By: Rolm Baptise M.D.   On: 07/17/2016 14:41   Dg Hand Complete Left  Result Date: 07/17/2016 CLINICAL DATA:  Soft tissue swelling EXAM: LEFT HAND - COMPLETE 3+ VIEW COMPARISON:  None. FINDINGS: Frontal, oblique, and lateral views were obtained. There is generalized soft tissue swelling. No soft tissue air. No fracture or dislocation identified. 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 Other joint spaces appear unremarkable. No erosive change or periostitis. IMPRESSION: Generalized soft tissue swelling. 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 No erosive change or periostitis. No fracture or dislocation.  Electronically Signed   By: Lowella Grip III M.D.   On: 07/17/2016 14:42      TODAY-DAY OF DISCHARGE:  Subjective:   Todd Ferguson 76 today has no headache,no chest abdominal pain,no new weakness tingling or numbness, feels much better wants to go home today.   Objective:   Blood pressure (!) 148/76, pulse 69, temperature 98.5 F (36.9 C), temperature source Oral, resp. rate 14, height 5\' 4"  (1.626 m), weight 88.8 kg (195 lb 12.3 oz), SpO2 94 %.  Intake/Output Summary (Last 24 hours) at 07/18/16 1302 Last data filed at 07/18/16 0409  Gross per 24 hour  Intake                0 ml  Output              100 ml  Net             -100 ml   Filed Weights   07/17/16 2150 07/18/16 0408  Weight: 89.7 kg (197 lb 12.8 oz) 88.8 kg (195 lb 12.3 oz)    Exam: Awake Alert, Oriented *3, No new F.N deficits, Normal affect [place].AT,PERRAL Supple Neck,No JVD, No cervical lymphadenopathy appriciated.  Symmetrical Chest wall movement, Good air movement bilaterally, CTAB RRR,No Gallops,Rubs or  new Murmurs, No Parasternal Heave +ve B.Sounds, Abd Soft, Non tender, No organomegaly appriciated, No rebound -guarding or rigidity. No Cyanosis, Clubbing or edema, No new Rash or bruise   PERTINENT RADIOLOGIC STUDIES: Dg Chest 2 View  Result Date: 07/17/2016 CLINICAL DATA:  Chest pain beginning 2 days ago EXAM: CHEST  2 VIEW COMPARISON:  06/09/2014 FINDINGS: Heart and mediastinal contours are within normal limits. No focal opacities or effusions. No acute bony abnormality. IMPRESSION: No active cardiopulmonary disease. Electronically Signed   By: Rolm Baptise M.D.   On: 07/17/2016 14:41   Dg Hand Complete Left  Result Date: 07/17/2016 CLINICAL DATA:  Soft tissue swelling EXAM: LEFT HAND - COMPLETE 3+ VIEW COMPARISON:  None. FINDINGS: Frontal, oblique, and lateral views were obtained. There is generalized soft tissue swelling. No soft tissue air. No fracture or dislocation identified. There is mild  osteoarthritic change in the second PIP and DIP joints. Other joint spaces appear unremarkable. No erosive change or periostitis. IMPRESSION: Generalized soft tissue swelling. 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 No erosive change or periostitis. No fracture or dislocation. Electronically Signed   By: Lowella Grip III M.D.   On: 07/17/2016 14:42     PERTINENT LAB RESULTS: CBC:  Recent Labs  07/17/16 1358  WBC 6.4  HGB 13.6  HCT 40.7  PLT 259   CMET CMP     Component Value Date/Time   NA 137 07/17/2016 1358   K 3.9 07/17/2016 1358   CL 108 07/17/2016 1358   CO2 22 07/17/2016 1358   GLUCOSE 155 (H) 07/17/2016 1358   BUN 8 07/17/2016 1358   CREATININE 1.14 07/17/2016 1358   CREATININE 1.31 (H) 02/07/2016 1239   CALCIUM 9.0 07/17/2016 1358   PROT 7.7 05/20/2016 2328   ALBUMIN 4.1 05/20/2016 2328   AST 316 (H) 05/20/2016 2328   ALT 127 (H) 05/20/2016 2328   ALKPHOS 91 05/20/2016 2328   BILITOT 1.2 05/20/2016 2328   GFRNONAA >60 07/17/2016 1358   GFRNONAA 54 (L) 02/07/2016 1239   GFRAA >60 07/17/2016 1358   GFRAA 62 02/07/2016 1239    GFR Estimated Creatinine Clearance: 58 mL/min (by C-G formula based on SCr of 1.14 mg/dL). No results for input(s): LIPASE, AMYLASE in the last 72 hours.  Recent Labs  07/17/16 2153 07/18/16 0335 07/18/16 0916  TROPONINI <0.03 <0.03 <0.03   Invalid input(s): POCBNP No results for input(s): DDIMER in the last 72 hours. No results for input(s): HGBA1C in the last 72 hours.  Recent Labs  07/18/16 0335  CHOL 164  HDL 48  LDLCALC 104*  TRIG 60  CHOLHDL 3.4   No results for input(s): TSH, T4TOTAL, T3FREE, THYROIDAB in the last 72 hours.  Invalid input(s): FREET3 No results for input(s): VITAMINB12, FOLATE, FERRITIN, TIBC, IRON, RETICCTPCT in the last 72 hours. Coags: No results for input(s): INR in the last 72 hours.  Invalid input(s): PT Microbiology: No results found for this or any previous  visit (from the past 240 hour(s)).  FURTHER DISCHARGE INSTRUCTIONS:  Get Medicines reviewed and adjusted: Please take all your medications with you for your next visit with your Primary MD  Laboratory/radiological data: Please request your Primary MD to go over all hospital tests and procedure/radiological results at the follow up, please ask your Primary MD to get all Hospital records sent to his/her office.  In some cases, they will be blood work, cultures and biopsy results pending at the time of your discharge. Please request  that your primary care M.D. goes through all the records of your hospital data and follows up on these results.  Also Note the following: If you experience worsening of your admission symptoms, develop shortness of breath, life threatening emergency, suicidal or homicidal thoughts you must seek medical attention immediately by calling 911 or calling your MD immediately  if symptoms less severe.  You must read complete instructions/literature along with all the possible adverse reactions/side effects for all the Medicines you take and that have been prescribed to you. Take any new Medicines after you have completely understood and accpet all the possible adverse reactions/side effects.   Do not drive when taking Pain medications or sleeping medications (Benzodaizepines)  Do not take more than prescribed Pain, Sleep and Anxiety Medications. It is not advisable to combine anxiety,sleep and pain medications without talking with your primary care practitioner  Special Instructions: If you have smoked or chewed Tobacco  in the last 2 yrs please stop smoking, stop any regular Alcohol  and or any Recreational drug use.  Wear Seat belts while driving.  Please note: You were cared for by a hospitalist during your hospital stay. Once you are discharged, your primary care physician will handle any further medical issues. Please note that NO REFILLS for any discharge  medications will be authorized once you are discharged, as it is imperative that you return to your primary care physician (or establish a relationship with a primary care physician if you do not have one) for your post hospital discharge needs so that they can reassess your need for medications and monitor your lab values.  Total Time spent coordinating discharge including counseling, education and face to face time equals 30 minutes.  SignedOren Binet 07/18/2016 1:03 PM

## 2016-07-31 MED FILL — HYDROCHLOROTHIAZIDE 25 MG T: 25 | 30 days supply | Qty: 30 | Fill #8

## 2016-07-31 MED FILL — LISINOPRIL 40 MG TABLET: 40 | 30 days supply | Qty: 30 | Fill #8

## 2016-07-31 MED FILL — ?AMLODIPINE BESYLATE 10 MG: 10 | 30 days supply | Qty: 30 | Fill #8

## 2016-09-17 ENCOUNTER — Ambulatory Visit: Payer: Medicare Other | Attending: Internal Medicine | Admitting: Internal Medicine

## 2016-09-17 ENCOUNTER — Encounter: Payer: Self-pay | Admitting: Internal Medicine

## 2016-09-17 VITALS — BP 113/62 | HR 55 | Temp 98.1°F | Resp 18 | Ht 64.0 in | Wt 200.0 lb

## 2016-09-17 DIAGNOSIS — Z09 Encounter for follow-up examination after completed treatment for conditions other than malignant neoplasm: Secondary | ICD-10-CM | POA: Diagnosis not present

## 2016-09-17 DIAGNOSIS — I10 Essential (primary) hypertension: Secondary | ICD-10-CM | POA: Diagnosis not present

## 2016-09-17 DIAGNOSIS — R7303 Prediabetes: Secondary | ICD-10-CM | POA: Diagnosis not present

## 2016-09-17 DIAGNOSIS — Z7982 Long term (current) use of aspirin: Secondary | ICD-10-CM | POA: Diagnosis not present

## 2016-09-17 DIAGNOSIS — Z23 Encounter for immunization: Secondary | ICD-10-CM

## 2016-09-17 DIAGNOSIS — E785 Hyperlipidemia, unspecified: Secondary | ICD-10-CM | POA: Insufficient documentation

## 2016-09-17 DIAGNOSIS — Z125 Encounter for screening for malignant neoplasm of prostate: Secondary | ICD-10-CM | POA: Diagnosis not present

## 2016-09-17 DIAGNOSIS — F17200 Nicotine dependence, unspecified, uncomplicated: Secondary | ICD-10-CM

## 2016-09-17 DIAGNOSIS — Z79899 Other long term (current) drug therapy: Secondary | ICD-10-CM | POA: Insufficient documentation

## 2016-09-17 DIAGNOSIS — F17210 Nicotine dependence, cigarettes, uncomplicated: Secondary | ICD-10-CM | POA: Diagnosis not present

## 2016-09-17 MED ORDER — HYDROCHLOROTHIAZIDE 25 MG PO TABS: 25.0000 mg | ORAL_TABLET | Freq: Every day | ORAL | 3 refills | Status: DC

## 2016-09-17 MED ORDER — AMLODIPINE BESYLATE 10 MG PO TABS: 10.0000 mg | ORAL_TABLET | Freq: Every day | ORAL | 3 refills | Status: DC

## 2016-09-17 MED ORDER — ASPIRIN EC 81 MG PO TBEC: 81.0000 mg | DELAYED_RELEASE_TABLET | Freq: Every day | ORAL | 6 refills | Status: DC

## 2016-09-17 MED ORDER — LISINOPRIL 40 MG PO TABS: 40.0000 mg | ORAL_TABLET | Freq: Every day | ORAL | 3 refills | Status: DC

## 2016-09-17 MED FILL — LISINOPRIL 40 MG TABLET: 40 | 30 days supply | Qty: 30 | Fill #0

## 2016-09-17 MED FILL — HYDROCHLOROTHIAZIDE 25 MG T: 25 | 30 days supply | Qty: 30 | Fill #0

## 2016-09-17 MED FILL — AMLODIPINE BESYLATE 10 MG T: 10 | 30 days supply | Qty: 30 | Fill #0

## 2016-09-17 NOTE — Progress Notes (Signed)
 Todd Ferguson, is a 75 y.o. male  CSN:[id number]  MRN:[id number]  DOB - 09/23/1940  Chief Complaint  Patient presents with  . Hypertension  . Annual Exam     Subjective:   Todd Ferguson is a 75 y.o. male with medical history of hypertension, prediabetes and dyslipidemia here today for hypertension follow up visit. He has no new complaints today, compliant with medications, reports no side effect. Patient was recently admitted to the hospital for atypical chest pain, MI ruled out and stress test was negative. No further work-up recommended by cardiologist. He continues to smoke about 5 - 6 cigarettes per day. He denies any pain. He has no headache, no fever. 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 He denies chest pain. No abdominal pain - No Nausea, No new weakness tingling or numbness, No Cough - SOB.  Problem  Hospital Discharge Follow-Up    ALLERGIES: No Known Allergies  PAST MEDICAL HISTORY: Past Medical History:  Diagnosis Date  . Bradycardia   . 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  . Hypertension   . Tobacco abuse    MEDICATIONS AT HOME: Prior to Admission medications   Medication Sig Start Date End Date Taking? Authorizing Provider  amLODipine (NORVASC) 10 MG tablet Take 1 tablet (10 mg total) by mouth daily. 09/17/16  Yes Olugbemiga E Jegede, MD  aspirin EC 81 MG tablet Take 1 tablet (81 mg total) by mouth daily. 09/17/16  Yes Olugbemiga E Jegede, MD  hydrochlorothiazide (HYDRODIURIL) 25 MG tablet Take 1 tablet (25 mg total) by mouth daily. 09/17/16  Yes Olugbemiga E Jegede, MD  lisinopril (PRINIVIL,ZESTRIL) 40 MG tablet Take 1 tablet (40 mg total) by mouth daily. 09/17/16  Yes Olugbemiga E Jegede, MD   Objective:   Vitals:   09/17/16 1517  BP: 113/62  Pulse: (!) 55  Resp: 18  Temp: 98.1 F (36.7 C)  TempSrc: Oral    SpO2: 96%  Weight: 200 lb (90.7 kg)  Height: 5' 4" (1.626 m)   Exam General appearance : Awake, alert, not in any distress. Speech Clear. Not toxic looking HEENT: Atraumatic and Normocephalic, prosthetic right eye Neck: Supple, no JVD. No cervical lymphadenopathy.  Chest: Good air entry bilaterally, no added sounds  CVS: S1 S2 regular, no murmurs.  Abdomen: Bowel sounds present, Non tender and not distended with no gaurding, rigidity or rebound. Extremities: B/L Lower Ext shows no edema, both legs are warm to touch Neurology: Awake alert, and oriented X 3, CN II-XII intact, Non focal Skin: No Rash  Data Review Lab Results  Component Value Date   HGBA1C 5.9 07/25/2015   HGBA1C 6.4 08/07/2014   HGBA1C 6.1 (H) 10/28/2013   Assessment & Plan   1. Essential hypertension, benign  - lisinopril (PRINIVIL,ZESTRIL) 40 MG tablet; Take 1 tablet (40 mg total) by mouth daily.  Dispense: 90 tablet; Refill: 3 - hydrochlorothiazide (HYDRODIURIL) 25 MG tablet; Take 1 tablet (25 mg total) by mouth daily.  Dispense: 90 tablet; Refill: 3 - amLODipine (NORVASC) 10 MG tablet; Take 1 tablet (10 mg total) by mouth daily.  Dispense: 90 tablet; Refill: 3 - aspirin EC 81 MG tablet; Take 1 tablet (81 mg total) by mouth daily.  Dispense: 30 tablet; Refill: 6 - CMP14+EGFR  2. Smoking  Todd Ferguson was counseled on the dangers of   tobacco use, and was advised to quit. Reviewed strategies to maximize success, including removing cigarettes and smoking materials from environment, stress management and support of family/friends.  3. Hospital discharge follow-up  Clinically stable. No new concern today. No chest pain Up to date with colonoscopy and PSA   Patient have been counseled extensively about nutrition and exercise. Other issues discussed during this visit include: low cholesterol diet, weight control and daily exercise, importance of adherence with medications and regular follow-up. We also discussed long  term complications of uncontrolled hypertension.   Return in about 6 months (around 03/20/2017) for Follow up HTN.  The patient was given clear instructions to go to ER or return to medical center if symptoms don't improve, worsen or new problems develop. The patient verbalized understanding. The patient was told to call to get lab results if they haven't heard anything in the next week.   This note has been created with Dragon speech recognition software and smart phrase technology. Any transcriptional errors are unintentional.   JEGEDE, OLUGBEMIGA, MD, MHA, FACP, FAAP, CPE [facility] [place], [place] [phone]   09/17/2016, 4:06 PM 

## 2016-09-17 NOTE — Patient Instructions (Signed)
Steps to Quit Smoking Smoking tobacco can be bad for your health. It can also affect almost every organ in your body. Smoking puts you and people around you at risk for many serious long-lasting (chronic) diseases. Quitting smoking is hard, but it is one of the best things that you can do for your health. It is never too late to quit. What are the benefits of quitting smoking? When you quit smoking, you lower your risk for getting serious diseases and conditions. They can include:  Lung cancer or lung disease.  Heart disease.  Stroke.  Heart attack.  Not being able to have children (infertility).  Weak bones (osteoporosis) and broken bones (fractures). If you have coughing, wheezing, and shortness of breath, those symptoms may get better when you quit. You may also get sick less often. If you are pregnant, quitting smoking can help to lower your chances of having a baby of low birth weight. What can I do to help me quit smoking? Talk with your doctor about what can help you quit smoking. Some things you can do (strategies) include:  Quitting smoking totally, instead of slowly cutting back how much you smoke over a period of time.  Going to in-person counseling. You are more likely to quit if you go to many counseling sessions.  Using resources and support systems, such as:  Online chats with a counselor.  Phone quitlines.  Printed self-help materials.  Support groups or group counseling.  Text messaging programs.  Mobile phone apps or applications.  Taking medicines. Some of these medicines may have nicotine in them. If you are pregnant or breastfeeding, do not take any medicines to quit smoking unless your doctor says it is okay. Talk with your doctor about counseling or other things that can help you. Talk with your doctor about using more than one strategy at the same time, such as taking medicines while you are also going to in-person counseling. This can help make quitting  easier. What things can I do to make it easier to quit? Quitting smoking might feel very hard at first, but there is a lot that you can do to make it easier. Take these steps:  Talk to your family and friends. Ask them to support and encourage you.  Call phone quitlines, reach out to support groups, or work with a counselor.  Ask people who smoke to not smoke around you.  Avoid places that make you want (trigger) to smoke, such as:  Bars.  Parties.  Smoke-break areas at work.  Spend time with people who do not smoke.  Lower the stress in your life. Stress can make you want to smoke. Try these things to help your stress:  Getting regular exercise.  Deep-breathing exercises.  Yoga.  Meditating.  Doing a body scan. To do this, close your eyes, focus on one area of your body at a time from head to toe, and notice which parts of your body are tense. Try to relax the muscles in those areas.  Download or buy apps on your mobile phone or tablet that can help you stick to your quit plan. There are many free apps, such as QuitGuide from the CDC (Centers for Disease Control and Prevention). You can find more support from smokefree.gov and other websites. This information is not intended to replace advice given to you by your health care provider. Make sure you discuss any questions you have with your health care provider. Document Released: 03/01/2009 Document Revised: 01/01/2016 Document   Reviewed: 09/19/2014 Elsevier Interactive Patient Education  2017 [employer] DASH Eating Plan DASH stands for "Dietary Approaches to Stop Hypertension." The DASH eating plan is a healthy eating plan that has been shown to reduce high blood pressure (hypertension). It may also reduce your risk for type 2 diabetes, heart disease, and stroke. The DASH eating plan may also help with weight loss. What are tips for following this plan? General guidelines   Avoid eating more than 2,300 mg (milligrams) of  salt (sodium) a day. If you have hypertension, you may need to reduce your sodium intake to 1,500 mg a day.  Limit alcohol intake to no more than 1 drink a day for nonpregnant women and 2 drinks a day for men. One drink equals 12 oz of beer, 5 oz of wine, or 1 oz of hard liquor.  Work with your health care provider to maintain a healthy body weight or to lose weight. Ask what an ideal weight is for you.  Get at least 30 minutes of exercise that causes your heart to beat faster (aerobic exercise) most days of the week. Activities may include walking, swimming, or biking.  Work with your health care provider or diet and nutrition specialist (dietitian) to adjust your eating plan to your individual calorie needs. Reading food labels   Check food labels for the amount of sodium per serving. Choose foods with less than 5 percent of the Daily Value of sodium. Generally, foods with less than 300 mg of sodium per serving fit into this eating plan.  To find whole grains, look for the word "whole" as the first word in the ingredient list. Shopping   Buy products labeled as "low-sodium" or "no salt added."  Buy fresh foods. Avoid canned foods and premade or frozen meals. Cooking   Avoid adding salt when cooking. Use salt-free seasonings or herbs instead of table salt or sea salt. Check with your health care provider or pharmacist before using salt substitutes.  Do not fry foods. Cook foods using healthy methods such as baking, boiling, grilling, and broiling instead.  Cook with heart-healthy oils, such as olive, canola, soybean, or sunflower oil. Meal planning    Eat a balanced diet that includes:  5 or more servings of fruits and vegetables each day. At each meal, try to fill half of your plate with fruits and vegetables.  Up to 6-8 servings of whole grains each day.  Less than 6 oz of lean meat, poultry, or fish each day. A 3-oz serving of meat is about the same size as a deck of cards.  One egg equals 1 oz.  2 servings of low-fat dairy each day.  A serving of nuts, seeds, or beans 5 times each week.  Heart-healthy fats. Healthy fats called Omega-3 fatty acids are found in foods such as flaxseeds and coldwater fish, like sardines, salmon, and mackerel.  Limit how much you eat of the following:  Canned or prepackaged foods.  Food that is high in trans fat, such as fried foods.  Food that is high in saturated fat, such as fatty meat.  Sweets, desserts, sugary drinks, and other foods with added sugar.  Full-fat dairy products.  Do not salt foods before eating.  Try to eat at least 2 vegetarian meals each week.  Eat more home-cooked food and less restaurant, buffet, and fast food.  When eating at a restaurant, ask that your food be prepared with less salt or no salt, if possible. What foods are   recommended? The items listed may not be a complete list. Talk with your dietitian about what dietary choices are best for you. Grains  Whole-grain or whole-wheat bread. Whole-grain or whole-wheat pasta. Brown rice. Oatmeal. Quinoa. Bulgur. Whole-grain and low-sodium cereals. Pita bread. Low-fat, low-sodium crackers. Whole-wheat flour tortillas. Vegetables  Fresh or frozen vegetables (raw, steamed, roasted, or grilled). Low-sodium or reduced-sodium tomato and vegetable juice. Low-sodium or reduced-sodium tomato sauce and tomato paste. Low-sodium or reduced-sodium canned vegetables. Fruits  All fresh, dried, or frozen fruit. Canned fruit in natural juice (without added sugar). Meat and other protein foods  Skinless chicken or turkey. Ground chicken or turkey. Pork with fat trimmed off. Fish and seafood. Egg whites. Dried beans, peas, or lentils. Unsalted nuts, nut butters, and seeds. Unsalted canned beans. Lean cuts of beef with fat trimmed off. Low-sodium, lean deli meat. Dairy  Low-fat (1%) or fat-free (skim) milk. Fat-free, low-fat, or reduced-fat cheeses. Nonfat,  low-sodium ricotta or cottage cheese. Low-fat or nonfat yogurt. Low-fat, low-sodium cheese. Fats and oils  Soft margarine without trans fats. Vegetable oil. Low-fat, reduced-fat, or light mayonnaise and salad dressings (reduced-sodium). Canola, safflower, olive, soybean, and sunflower oils. Avocado. Seasoning and other foods  Herbs. Spices. Seasoning mixes without salt. Unsalted popcorn and pretzels. Fat-free sweets. What foods are not recommended? The items listed may not be a complete list. Talk with your dietitian about what dietary choices are best for you. Grains  Baked goods made with fat, such as croissants, muffins, or some breads. Dry pasta or rice meal packs. Vegetables  Creamed or fried vegetables. Vegetables in a cheese sauce. Regular canned vegetables (not low-sodium or reduced-sodium). Regular canned tomato sauce and paste (not low-sodium or reduced-sodium). Regular tomato and vegetable juice (not low-sodium or reduced-sodium). Pickles. Olives. Fruits  Canned fruit in a light or heavy syrup. Fried fruit. Fruit in cream or butter sauce. Meat and other protein foods  Fatty cuts of meat. Ribs. Fried meat. Bacon. Sausage. Bologna and other processed lunch meats. Salami. Fatback. Hotdogs. Bratwurst. Salted nuts and seeds. Canned beans with added salt. Canned or smoked fish. Whole eggs or egg yolks. Chicken or turkey with skin. Dairy  Whole or 2% milk, cream, and half-and-half. Whole or full-fat cream cheese. Whole-fat or sweetened yogurt. Full-fat cheese. Nondairy creamers. Whipped toppings. Processed cheese and cheese spreads. Fats and oils  Butter. Stick margarine. Lard. Shortening. Ghee. Bacon fat. Tropical oils, such as coconut, palm kernel, or palm oil. Seasoning and other foods  Salted popcorn and pretzels. Onion salt, garlic salt, seasoned salt, table salt, and sea salt. Worcestershire sauce. Tartar sauce. Barbecue sauce. Teriyaki sauce. Soy sauce, including reduced-sodium. Steak  sauce. Canned and packaged gravies. Fish sauce. Oyster sauce. Cocktail sauce. Horseradish that you find on the shelf. Ketchup. Mustard. Meat flavorings and tenderizers. Bouillon cubes. Hot sauce and Tabasco sauce. Premade or packaged marinades. Premade or packaged taco seasonings. Relishes. Regular salad dressings. Where to find more information:  National Heart, Lung, and Blood Institute: www.nhlbi.nih.gov  American Heart Association: www.heart.org Summary  The DASH eating plan is a healthy eating plan that has been shown to reduce high blood pressure (hypertension). It may also reduce your risk for type 2 diabetes, heart disease, and stroke.  With the DASH eating plan, you should limit salt (sodium) intake to 2,300 mg a day. If you have hypertension, you may need to reduce your sodium intake to 1,500 mg a day.  When on the DASH eating plan, aim to eat more fresh fruits and vegetables,   whole grains, lean proteins, low-fat dairy, and heart-healthy fats.  Work with your health care provider or diet and nutrition specialist (dietitian) to adjust your eating plan to your individual calorie needs. This information is not intended to replace advice given to you by your health care provider. Make sure you discuss any questions you have with your health care provider. Document Released: 04/24/2011 Document Revised: 04/28/2016 Document Reviewed: 04/28/2016 Elsevier Interactive Patient Education  2017 [employer] Hypertension Hypertension, commonly called high blood pressure, is when the force of blood pumping through the arteries is too strong. The arteries are the blood vessels that carry blood from the heart throughout the body. Hypertension forces the heart to work harder to pump blood and may cause arteries to become narrow or stiff. Having untreated or uncontrolled hypertension can cause heart attacks, strokes, kidney disease, and other problems. A blood pressure reading consists of a higher  number over a lower number. Ideally, your blood pressure should be below 120/80. The first ("top") number is called the systolic pressure. It is a measure of the pressure in your arteries as your heart beats. The second ("bottom") number is called the diastolic pressure. It is a measure of the pressure in your arteries as the heart relaxes. What are the causes? The cause of this condition is not known. What increases the risk? Some risk factors for high blood pressure are under your control. Others are not. Factors you can change   Smoking.  Having type 2 diabetes mellitus, high cholesterol, or both.  Not getting enough exercise or physical activity.  Being overweight.  Having too much fat, sugar, calories, or salt (sodium) in your diet.  Drinking too much alcohol. Factors that are difficult or impossible to change   Having chronic kidney disease.  Having a family history of high blood pressure.  Age. Risk increases with age.  Race. You may be at higher risk if you are African-American.  Gender. Men are at higher risk than women before age 45. After age 65, women are at higher risk than men.  Having obstructive sleep apnea.  Stress. What are the signs or symptoms? Extremely high blood pressure (hypertensive crisis) may cause:  Headache.  Anxiety.  Shortness of breath.  Nosebleed.  Nausea and vomiting.  Severe chest pain.  Jerky movements you cannot control (seizures). How is this diagnosed? This condition is diagnosed by measuring your blood pressure while you are seated, with your arm resting on a surface. The cuff of the blood pressure monitor will be placed directly against the skin of your upper arm at the level of your heart. It should be measured at least twice using the same arm. Certain conditions can cause a difference in blood pressure between your right and left arms. Certain factors can cause blood pressure readings to be lower or higher than normal  (elevated) for a short period of time:  When your blood pressure is higher when you are in a health care provider's office than when you are at home, this is called white coat hypertension. Most people with this condition do not need medicines.  When your blood pressure is higher at home than when you are in a health care provider's office, this is called masked hypertension. Most people with this condition may need medicines to control blood pressure. If you have a high blood pressure reading during one visit or you have normal blood pressure with other risk factors:  You may be asked to return on a different   day to have your blood pressure checked again.  You may be asked to monitor your blood pressure at home for 1 week or longer. If you are diagnosed with hypertension, you may have other blood or imaging tests to help your health care provider understand your overall risk for other conditions. How is this treated? This condition is treated by making healthy lifestyle changes, such as eating healthy foods, exercising more, and reducing your alcohol intake. Your health care provider may prescribe medicine if lifestyle changes are not enough to get your blood pressure under control, and if:  Your systolic blood pressure is above 130.  Your diastolic blood pressure is above 80. Your personal target blood pressure may vary depending on your medical conditions, your age, and other factors. Follow these instructions at home: Eating and drinking   Eat a diet that is high in fiber and potassium, and low in sodium, added sugar, and fat. An example eating plan is called the DASH (Dietary Approaches to Stop Hypertension) diet. To eat this way:  Eat plenty of fresh fruits and vegetables. Try to fill half of your plate at each meal with fruits and vegetables.  Eat whole grains, such as whole wheat pasta, brown rice, or whole grain bread. Fill about one quarter of your plate with whole grains.  Eat  or drink low-fat dairy products, such as skim milk or low-fat yogurt.  Avoid fatty cuts of meat, processed or cured meats, and poultry with skin. Fill about one quarter of your plate with lean proteins, such as fish, chicken without skin, beans, eggs, and tofu.  Avoid premade and processed foods. These tend to be higher in sodium, added sugar, and fat.  Reduce your daily sodium intake. Most people with hypertension should eat less than 1,500 mg of sodium a day.  Limit alcohol intake to no more than 1 drink a day for nonpregnant women and 2 drinks a day for men. One drink equals 12 oz of beer, 5 oz of wine, or 1 oz of hard liquor. Lifestyle   Work with your health care provider to maintain a healthy body weight or to lose weight. Ask what an ideal weight is for you.  Get at least 30 minutes of exercise that causes your heart to beat faster (aerobic exercise) most days of the week. Activities may include walking, swimming, or biking.  Include exercise to strengthen your muscles (resistance exercise), such as pilates or lifting weights, as part of your weekly exercise routine. Try to do these types of exercises for 30 minutes at least 3 days a week.  Do not use any products that contain nicotine or tobacco, such as cigarettes and e-cigarettes. If you need help quitting, ask your health care provider.  Monitor your blood pressure at home as told by your health care provider.  Keep all follow-up visits as told by your health care provider. This is important. Medicines   Take over-the-counter and prescription medicines only as told by your health care provider. Follow directions carefully. Blood pressure medicines must be taken as prescribed.  Do not skip doses of blood pressure medicine. Doing this puts you at risk for problems and can make the medicine less effective.  Ask your health care provider about side effects or reactions to medicines that you should watch for. Contact a health care  provider if:  You think you are having a reaction to a medicine you are taking.  You have headaches that keep coming back (recurring).  You   feel dizzy.  You have swelling in your ankles.  You have trouble with your vision. Get help right away if:  You develop a severe headache or confusion.  You have unusual weakness or numbness.  You feel faint.  You have severe pain in your chest or abdomen.  You vomit repeatedly.  You have trouble breathing. Summary  Hypertension is when the force of blood pumping through your arteries is too strong. If this condition is not controlled, it may put you at risk for serious complications.  Your personal target blood pressure may vary depending on your medical conditions, your age, and other factors. For most people, a normal blood pressure is less than 120/80.  Hypertension is treated with lifestyle changes, medicines, or a combination of both. Lifestyle changes include weight loss, eating a healthy, low-sodium diet, exercising more, and limiting alcohol. This information is not intended to replace advice given to you by your health care provider. Make sure you discuss any questions you have with your health care provider. Document Released: 05/05/2005 Document Revised: 04/02/2016 Document Reviewed: 04/02/2016 Elsevier Interactive Patient Education  2017 [employer]  

## 2016-09-17 NOTE — Progress Notes (Signed)
Patient is here for FU HTN  Patient denies pain at this time.  Patient has taken medication today. Patient has eaten today.  Patient tolerated vaccines well today.

## 2016-09-18 LAB — CMP14+EGFR
ALT: 13 IU/L (ref 0–44)
AST: 15 IU/L (ref 0–40)
Albumin/Globulin Ratio: 1.4 (ref 1.2–2.2)
Albumin: 4.4 g/dL (ref 3.5–4.8)
Alkaline Phosphatase: 57 IU/L (ref 39–117)
BUN / CREAT RATIO: 22 (ref 10–24)
BUN: 27 mg/dL (ref 8–27)
Bilirubin Total: <0.2 mg/dL (ref 0.0–1.2)
CO2: 22 mmol/L (ref 18–29)
CREATININE: 1.25 mg/dL (ref 0.76–1.27)
Calcium: 9.6 mg/dL (ref 8.6–10.2)
Chloride: 99 mmol/L (ref 96–106)
GFR calc non Af Amer: 57 mL/min/{1.73_m2} — ABNORMAL LOW (ref >59)
GFR, EST AFRICAN AMERICAN: 66 mL/min/{1.73_m2} (ref >59)
GLUCOSE: 88 mg/dL (ref 65–99)
Globulin, Total: 3.1 g/dL (ref 1.5–4.5)
Potassium: 4.4 mmol/L (ref 3.5–5.2)
Sodium: 137 mmol/L (ref 134–144)
TOTAL PROTEIN: 7.5 g/dL (ref 6.0–8.5)

## 2016-09-22 ENCOUNTER — Telehealth: Payer: Self-pay | Admitting: *Deleted

## 2016-09-22 NOTE — Telephone Encounter (Signed)
-----   Message from Tresa Garter, MD sent at 09/21/2016  7:05 PM EDT ----- Normal comprehensive panel.

## 2016-09-22 NOTE — Telephone Encounter (Signed)
Patient verified DOB Patient is aware of CMP being normal. Patient had no further questions at this time.

## 2016-10-29 MED FILL — LISINOPRIL 40 MG TABLET: 40 | 30 days supply | Qty: 30 | Fill #1

## 2016-10-29 MED FILL — AMLODIPINE BESYLATE 10 MG T: 10 | 30 days supply | Qty: 30 | Fill #1

## 2016-10-29 MED FILL — HYDROCHLOROTHIAZIDE 25 MG T: 25 | 30 days supply | Qty: 30 | Fill #1

## 2016-11-25 ENCOUNTER — Other Ambulatory Visit: Payer: Self-pay | Admitting: Internal Medicine

## 2016-11-25 DIAGNOSIS — I10 Essential (primary) hypertension: Secondary | ICD-10-CM

## 2016-11-25 MED FILL — LISINOPRIL 40 MG TABLET: 40 | 30 days supply | Qty: 30 | Fill #2

## 2016-11-25 MED FILL — HYDROCHLOROTHIAZIDE 25 MG T: 25 | 30 days supply | Qty: 30 | Fill #2

## 2016-11-25 MED FILL — ?AMLODIPINE BESYLATE 10 MG: 10 | 30 days supply | Qty: 30 | Fill #2

## 2017-02-25 ENCOUNTER — Other Ambulatory Visit: Payer: Self-pay | Admitting: Internal Medicine

## 2017-02-25 DIAGNOSIS — I10 Essential (primary) hypertension: Secondary | ICD-10-CM

## 2017-02-25 MED FILL — LISINOPRIL 40 MG TABLET: 40 | 30 days supply | Qty: 30 | Fill #3

## 2017-02-25 MED FILL — HYDROCHLOROTHIAZIDE 25 MG T: 25 | 30 days supply | Qty: 30 | Fill #3

## 2017-02-25 MED FILL — AMLODIPINE BESYLATE 10 MG T: 10 | 30 days supply | Qty: 30 | Fill #3

## 2017-03-18 ENCOUNTER — Encounter: Payer: Self-pay | Admitting: Internal Medicine

## 2017-03-18 ENCOUNTER — Ambulatory Visit: Payer: Medicare Other | Attending: Internal Medicine | Admitting: Internal Medicine

## 2017-03-18 VITALS — BP 115/63 | HR 62 | Temp 98.0°F | Resp 18 | Ht 64.0 in | Wt 206.0 lb

## 2017-03-18 DIAGNOSIS — R001 Bradycardia, unspecified: Secondary | ICD-10-CM | POA: Insufficient documentation

## 2017-03-18 DIAGNOSIS — Z791 Long term (current) use of non-steroidal anti-inflammatories (NSAID): Secondary | ICD-10-CM | POA: Diagnosis not present

## 2017-03-18 DIAGNOSIS — Z7982 Long term (current) use of aspirin: Secondary | ICD-10-CM | POA: Insufficient documentation

## 2017-03-18 DIAGNOSIS — E785 Hyperlipidemia, unspecified: Secondary | ICD-10-CM | POA: Insufficient documentation

## 2017-03-18 DIAGNOSIS — F17200 Nicotine dependence, unspecified, uncomplicated: Secondary | ICD-10-CM

## 2017-03-18 DIAGNOSIS — R7303 Prediabetes: Secondary | ICD-10-CM | POA: Diagnosis not present

## 2017-03-18 DIAGNOSIS — IMO0001 Reserved for inherently not codable concepts without codable children: Secondary | ICD-10-CM

## 2017-03-18 DIAGNOSIS — F17210 Nicotine dependence, cigarettes, uncomplicated: Secondary | ICD-10-CM | POA: Insufficient documentation

## 2017-03-18 DIAGNOSIS — Z79899 Other long term (current) drug therapy: Secondary | ICD-10-CM | POA: Diagnosis not present

## 2017-03-18 DIAGNOSIS — I10 Essential (primary) hypertension: Secondary | ICD-10-CM | POA: Diagnosis not present

## 2017-03-18 DIAGNOSIS — M10071 Idiopathic gout, right ankle and foot: Secondary | ICD-10-CM | POA: Diagnosis not present

## 2017-03-18 MED ORDER — COLCHICINE 0.6 MG PO CAPS: ORAL_CAPSULE | ORAL | 0 refills | Status: DC

## 2017-03-18 MED ORDER — IBUPROFEN 800 MG PO TABS: 800.0000 mg | ORAL_TABLET | Freq: Three times a day (TID) | ORAL | 0 refills | Status: DC | PRN

## 2017-03-18 MED ORDER — AMLODIPINE BESYLATE 10 MG PO TABS: 10.0000 mg | ORAL_TABLET | Freq: Every day | ORAL | 3 refills | Status: DC

## 2017-03-18 MED ORDER — KETOROLAC TROMETHAMINE 30 MG/ML IJ SOLN
30.0000 mg | Freq: Once | INTRAMUSCULAR | Status: AC
  Administered 2017-03-18: 30 mg via INTRAMUSCULAR

## 2017-03-18 MED ORDER — LISINOPRIL 40 MG PO TABS: 40.0000 mg | ORAL_TABLET | Freq: Every day | ORAL | 3 refills | Status: DC

## 2017-03-18 MED ORDER — HYDROCHLOROTHIAZIDE 25 MG PO TABS: 25.0000 mg | ORAL_TABLET | Freq: Every day | ORAL | 3 refills | Status: DC

## 2017-03-18 NOTE — Progress Notes (Signed)
Patients is here for a hypertension follow up.

## 2017-03-18 NOTE — Patient Instructions (Signed)
DASH Eating Plan DASH stands for "Dietary Approaches to Stop Hypertension." The DASH eating plan is a healthy eating plan that has been shown to reduce high blood pressure (hypertension). It may also reduce your risk for type 2 diabetes, heart disease, and stroke. The DASH eating plan may also help with weight loss. What are tips for following this plan? General guidelines  Avoid eating more than 2,300 mg (milligrams) of salt (sodium) a day. If you have hypertension, you may need to reduce your sodium intake to 1,500 mg a day.  Limit alcohol intake to no more than 1 drink a day for nonpregnant women and 2 drinks a day for men. One drink equals 12 oz of beer, 5 oz of wine, or 1 oz of hard liquor.  Work with your health care provider to maintain a healthy body weight or to lose weight. Ask what an ideal weight is for you.  Get at least 30 minutes of exercise that causes your heart to beat faster (aerobic exercise) most days of the week. Activities may include walking, swimming, or biking.  Work with your health care provider or diet and nutrition specialist (dietitian) to adjust your eating plan to your individual calorie needs. Reading food labels  Check food labels for the amount of sodium per serving. Choose foods with less than 5 percent of the Daily Value of sodium. Generally, foods with less than 300 mg of sodium per serving fit into this eating plan.  To find whole grains, look for the word "whole" as the first word in the ingredient list. Shopping  Buy products labeled as "low-sodium" or "no salt added."  Buy fresh foods. Avoid canned foods and premade or frozen meals. Cooking  Avoid adding salt when cooking. Use salt-free seasonings or herbs instead of table salt or sea salt. Check with your health care provider or pharmacist before using salt substitutes.  Do not fry foods. Cook foods using healthy methods such as baking, boiling, grilling, and broiling instead.  Cook with  heart-healthy oils, such as olive, canola, soybean, or sunflower oil. Meal planning   Eat a balanced diet that includes: ? 5 or more servings of fruits and vegetables each day. At each meal, try to fill half of your plate with fruits and vegetables. ? Up to 6-8 servings of whole grains each day. ? Less than 6 oz of lean meat, poultry, or fish each day. A 3-oz serving of meat is about the same size as a deck of cards. One egg equals 1 oz. ? 2 servings of low-fat dairy each day. ? A serving of nuts, seeds, or beans 5 times each week. ? Heart-healthy fats. Healthy fats called Omega-3 fatty acids are found in foods such as flaxseeds and coldwater fish, like sardines, salmon, and mackerel.  Limit how much you eat of the following: ? Canned or prepackaged foods. ? Food that is high in trans fat, such as fried foods. ? Food that is high in saturated fat, such as fatty meat. ? Sweets, desserts, sugary drinks, and other foods with added sugar. ? Full-fat dairy products.  Do not salt foods before eating.  Try to eat at least 2 vegetarian meals each week.  Eat more home-cooked food and less restaurant, buffet, and fast food.  When eating at a restaurant, ask that your food be prepared with less salt or no salt, if possible. What foods are recommended? The items listed may not be a complete list. Talk with your dietitian about what   dietary choices are best for you. Grains Whole-grain or whole-wheat bread. Whole-grain or whole-wheat pasta. Brown rice. Oatmeal. Quinoa. Bulgur. Whole-grain and low-sodium cereals. Pita bread. Low-fat, low-sodium crackers. Whole-wheat flour tortillas. Vegetables Fresh or frozen vegetables (raw, steamed, roasted, or grilled). Low-sodium or reduced-sodium tomato and vegetable juice. Low-sodium or reduced-sodium tomato sauce and tomato paste. Low-sodium or reduced-sodium canned vegetables. Fruits All fresh, dried, or frozen fruit. Canned fruit in natural juice (without  added sugar). Meat and other protein foods Skinless chicken or turkey. Ground chicken or turkey. Pork with fat trimmed off. Fish and seafood. Egg whites. Dried beans, peas, or lentils. Unsalted nuts, nut butters, and seeds. Unsalted canned beans. Lean cuts of beef with fat trimmed off. Low-sodium, lean deli meat. Dairy Low-fat (1%) or fat-free (skim) milk. Fat-free, low-fat, or reduced-fat cheeses. Nonfat, low-sodium ricotta or cottage cheese. Low-fat or nonfat yogurt. Low-fat, low-sodium cheese. Fats and oils Soft margarine without trans fats. Vegetable oil. Low-fat, reduced-fat, or light mayonnaise and salad dressings (reduced-sodium). Canola, safflower, olive, soybean, and sunflower oils. Avocado. Seasoning and other foods Herbs. Spices. Seasoning mixes without salt. Unsalted popcorn and pretzels. Fat-free sweets. What foods are not recommended? The items listed may not be a complete list. Talk with your dietitian about what dietary choices are best for you. Grains Baked goods made with fat, such as croissants, muffins, or some breads. Dry pasta or rice meal packs. Vegetables Creamed or fried vegetables. Vegetables in a cheese sauce. Regular canned vegetables (not low-sodium or reduced-sodium). Regular canned tomato sauce and paste (not low-sodium or reduced-sodium). Regular tomato and vegetable juice (not low-sodium or reduced-sodium). Pickles. Olives. Fruits Canned fruit in a light or heavy syrup. Fried fruit. Fruit in cream or butter sauce. Meat and other protein foods Fatty cuts of meat. Ribs. Fried meat. Bacon. Sausage. Bologna and other processed lunch meats. Salami. Fatback. Hotdogs. Bratwurst. Salted nuts and seeds. Canned beans with added salt. Canned or smoked fish. Whole eggs or egg yolks. Chicken or turkey with skin. Dairy Whole or 2% milk, cream, and half-and-half. Whole or full-fat cream cheese. Whole-fat or sweetened yogurt. Full-fat cheese. Nondairy creamers. Whipped toppings.  Processed cheese and cheese spreads. Fats and oils Butter. Stick margarine. Lard. Shortening. Ghee. Bacon fat. Tropical oils, such as coconut, palm kernel, or palm oil. Seasoning and other foods Salted popcorn and pretzels. Onion salt, garlic salt, seasoned salt, table salt, and sea salt. Worcestershire sauce. Tartar sauce. Barbecue sauce. Teriyaki sauce. Soy sauce, including reduced-sodium. Steak sauce. Canned and packaged gravies. Fish sauce. Oyster sauce. Cocktail sauce. Horseradish that you find on the shelf. Ketchup. Mustard. Meat flavorings and tenderizers. Bouillon cubes. Hot sauce and Tabasco sauce. Premade or packaged marinades. Premade or packaged taco seasonings. Relishes. Regular salad dressings. Where to find more information:  National Heart, Lung, and Blood Institute: www.nhlbi.nih.gov  American Heart Association: www.heart.org Summary  The DASH eating plan is a healthy eating plan that has been shown to reduce high blood pressure (hypertension). It may also reduce your risk for type 2 diabetes, heart disease, and stroke.  With the DASH eating plan, you should limit salt (sodium) intake to 2,300 mg a day. If you have hypertension, you may need to reduce your sodium intake to 1,500 mg a day.  When on the DASH eating plan, aim to eat more fresh fruits and vegetables, whole grains, lean proteins, low-fat dairy, and heart-healthy fats.  Work with your health care provider or diet and nutrition specialist (dietitian) to adjust your eating plan to your individual   calorie needs. This information is not intended to replace advice given to you by your health care provider. Make sure you discuss any questions you have with your health care provider. Document Released: 04/24/2011 Document Revised: 04/28/2016 Document Reviewed: 04/28/2016 Elsevier Interactive Patient Education  2017 [employer] Hypertension Hypertension, commonly called high blood pressure, is when the force of blood  pumping through the arteries is too strong. The arteries are the blood vessels that carry blood from the heart throughout the body. Hypertension forces the heart to work harder to pump blood and may cause arteries to become narrow or stiff. Having untreated or uncontrolled hypertension can cause heart attacks, strokes, kidney disease, and other problems. A blood pressure reading consists of a higher number over a lower number. Ideally, your blood pressure should be below 120/80. The first ("top") number is called the systolic pressure. It is a measure of the pressure in your arteries as your heart beats. The second ("bottom") number is called the diastolic pressure. It is a measure of the pressure in your arteries as the heart relaxes. What are the causes? The cause of this condition is not known. What increases the risk? Some risk factors for high blood pressure are under your control. Others are not. Factors you can change  Smoking.  Having type 2 diabetes mellitus, high cholesterol, or both.  Not getting enough exercise or physical activity.  Being overweight.  Having too much fat, sugar, calories, or salt (sodium) in your diet.  Drinking too much alcohol. Factors that are difficult or impossible to change  Having chronic kidney disease.  Having a family history of high blood pressure.  Age. Risk increases with age.  Race. You may be at higher risk if you are African-American.  Gender. Men are at higher risk than women before age 45. After age 65, women are at higher risk than men.  Having obstructive sleep apnea.  Stress. What are the signs or symptoms? Extremely high blood pressure (hypertensive crisis) may cause:  Headache.  Anxiety.  Shortness of breath.  Nosebleed.  Nausea and vomiting.  Severe chest pain.  Jerky movements you cannot control (seizures).  How is this diagnosed? This condition is diagnosed by measuring your blood pressure while you are  seated, with your arm resting on a surface. The cuff of the blood pressure monitor will be placed directly against the skin of your upper arm at the level of your heart. It should be measured at least twice using the same arm. Certain conditions can cause a difference in blood pressure between your right and left arms. Certain factors can cause blood pressure readings to be lower or higher than normal (elevated) for a short period of time:  When your blood pressure is higher when you are in a health care provider's office than when you are at home, this is called white coat hypertension. Most people with this condition do not need medicines.  When your blood pressure is higher at home than when you are in a health care provider's office, this is called masked hypertension. Most people with this condition may need medicines to control blood pressure.  If you have a high blood pressure reading during one visit or you have normal blood pressure with other risk factors:  You may be asked to return on a different day to have your blood pressure checked again.  You may be asked to monitor your blood pressure at home for 1 week or longer.  If you are diagnosed   with hypertension, you may have other blood or imaging tests to help your health care provider understand your overall risk for other conditions. How is this treated? This condition is treated by making healthy lifestyle changes, such as eating healthy foods, exercising more, and reducing your alcohol intake. Your health care provider may prescribe medicine if lifestyle changes are not enough to get your blood pressure under control, and if:  Your systolic blood pressure is above 130.  Your diastolic blood pressure is above 80.  Your personal target blood pressure may vary depending on your medical conditions, your age, and other factors. Follow these instructions at home: Eating and drinking  Eat a diet that is high in fiber and potassium,  and low in sodium, added sugar, and fat. An example eating plan is called the DASH (Dietary Approaches to Stop Hypertension) diet. To eat this way: ? Eat plenty of fresh fruits and vegetables. Try to fill half of your plate at each meal with fruits and vegetables. ? Eat whole grains, such as whole wheat pasta, brown rice, or whole grain bread. Fill about one quarter of your plate with whole grains. ? Eat or drink low-fat dairy products, such as skim milk or low-fat yogurt. ? Avoid fatty cuts of meat, processed or cured meats, and poultry with skin. Fill about one quarter of your plate with lean proteins, such as fish, chicken without skin, beans, eggs, and tofu. ? Avoid premade and processed foods. These tend to be higher in sodium, added sugar, and fat.  Reduce your daily sodium intake. Most people with hypertension should eat less than 1,500 mg of sodium a day.  Limit alcohol intake to no more than 1 drink a day for nonpregnant women and 2 drinks a day for men. One drink equals 12 oz of beer, 5 oz of wine, or 1 oz of hard liquor. Lifestyle  Work with your health care provider to maintain a healthy body weight or to lose weight. Ask what an ideal weight is for you.  Get at least 30 minutes of exercise that causes your heart to beat faster (aerobic exercise) most days of the week. Activities may include walking, swimming, or biking.  Include exercise to strengthen your muscles (resistance exercise), such as pilates or lifting weights, as part of your weekly exercise routine. Try to do these types of exercises for 30 minutes at least 3 days a week.  Do not use any products that contain nicotine or tobacco, such as cigarettes and e-cigarettes. If you need help quitting, ask your health care provider.  Monitor your blood pressure at home as told by your health care provider.  Keep all follow-up visits as told by your health care provider. This is important. Medicines  Take over-the-counter and  prescription medicines only as told by your health care provider. Follow directions carefully. Blood pressure medicines must be taken as prescribed.  Do not skip doses of blood pressure medicine. Doing this puts you at risk for problems and can make the medicine less effective.  Ask your health care provider about side effects or reactions to medicines that you should watch for. Contact a health care provider if:  You think you are having a reaction to a medicine you are taking.  You have headaches that keep coming back (recurring).  You feel dizzy.  You have swelling in your ankles.  You have trouble with your vision. Get help right away if:  You develop a severe headache or confusion.  You   have unusual weakness or numbness.  You feel faint.  You have severe pain in your chest or abdomen.  You vomit repeatedly.  You have trouble breathing. Summary  Hypertension is when the force of blood pumping through your arteries is too strong. If this condition is not controlled, it may put you at risk for serious complications.  Your personal target blood pressure may vary depending on your medical conditions, your age, and other factors. For most people, a normal blood pressure is less than 120/80.  Hypertension is treated with lifestyle changes, medicines, or a combination of both. Lifestyle changes include weight loss, eating a healthy, low-sodium diet, exercising more, and limiting alcohol. This information is not intended to replace advice given to you by your health care provider. Make sure you discuss any questions you have with your health care provider. Document Released: 05/05/2005 Document Revised: 04/02/2016 Document Reviewed: 04/02/2016 Elsevier Interactive Patient Education  2018 [employer]. Gout Gout is painful swelling that can occur in some of your joints. Gout is a type of arthritis. This condition is caused by having too much uric acid in your body. Uric acid is a  chemical that forms when your body breaks down substances called purines. Purines are important for building body proteins. When your body has too much uric acid, sharp crystals can form and build up inside your joints. This causes pain and swelling. Gout attacks can happen quickly and be very painful (acute gout). Over time, the attacks can affect more joints and become more frequent (chronic gout). Gout can also cause uric acid to build up under your skin and inside your kidneys. What are the causes? This condition is caused by too much uric acid in your blood. This can occur because:  Your kidneys do not remove enough uric acid from your blood. This is the most common cause.  Your body makes too much uric acid. This can occur with some cancers and cancer treatments. It can also occur if your body is breaking down too many red blood cells (hemolytic anemia).  You eat too many foods that are high in purines. These foods include organ meats and some seafood. Alcohol, especially beer, is also high in purines.  A gout attack may be triggered by trauma or stress. What increases the risk? This condition is more likely to develop in people who:  Have a family history of gout.  Are male and middle-aged.  Are male and have gone through menopause.  Are obese.  Frequently drink alcohol, especially beer.  Are dehydrated.  Lose weight too quickly.  Have an organ transplant.  Have lead poisoning.  Take certain medicines, including aspirin, cyclosporine, diuretics, levodopa, and niacin.  Have kidney disease or psoriasis.  What are the signs or symptoms? An attack of acute gout happens quickly. It usually occurs in just one joint. The most common place is the big toe. Attacks often start at night. Other joints that may be affected include joints of the feet, ankle, knee, fingers, wrist, or elbow. Symptoms may include:  Severe pain.  Warmth.  Swelling.  Stiffness.  Tenderness. The  affected joint may be very painful to touch.  Shiny, red, or purple skin.  Chills and fever.  Chronic gout may cause symptoms more frequently. More joints may be involved. You may also have white or yellow lumps (tophi) on your hands or feet or in other areas near your joints. How is this diagnosed? This condition is diagnosed based on your symptoms, medical  history, and physical exam. You may have tests, such as:  Blood tests to measure uric acid levels.  Removal of joint fluid with a needle (aspiration) to look for uric acid crystals.  X-rays to look for joint damage.  How is this treated? Treatment for this condition has two phases: treating an acute attack and preventing future attacks. Acute gout treatment may include medicines to reduce pain and swelling, including:  NSAIDs.  Steroids. These are strong anti-inflammatory medicines that can be taken by mouth (orally) or injected into a joint.  Colchicine. This medicine relieves pain and swelling when it is taken soon after an attack. It can be given orally or through an IV tube.  Preventive treatment may include:  Daily use of smaller doses of NSAIDs or colchicine.  Use of a medicine that reduces uric acid levels in your blood.  Changes to your diet. You may need to see a specialist about healthy eating (dietitian).  Follow these instructions at home: During a Gout Attack  If directed, apply ice to the affected area: ? Put ice in a plastic bag. ? Place a towel between your skin and the bag. ? Leave the ice on for 20 minutes, 2-3 times a day.  Rest the joint as much as possible. If the affected joint is in your leg, you may be given crutches to use.  Raise (elevate) the affected joint above the level of your heart as often as possible.  Drink enough fluids to keep your urine clear or pale yellow.  Take over-the-counter and prescription medicines only as told by your health care provider.  Do not drive or operate  heavy machinery while taking prescription pain medicine.  Follow instructions from your health care provider about eating or drinking restrictions.  Return to your normal activities as told by your health care provider. Ask your health care provider what activities are safe for you. Avoiding Future Gout Attacks  Follow a low-purine diet as told by your dietitian or health care provider. Avoid foods and drinks that are high in purines, including liver, kidney, anchovies, asparagus, herring, mushrooms, mussels, and beer.  Limit alcohol intake to no more than 1 drink a day for nonpregnant women and 2 drinks a day for men. One drink equals 12 oz of beer, 5 oz of wine, or 1 oz of hard liquor.  Maintain a healthy weight or lose weight if you are overweight. If you want to lose weight, talk with your health care provider. It is important that you do not lose weight too quickly.  Start or maintain an exercise program as told by your health care provider.  Drink enough fluids to keep your urine clear or pale yellow.  Take over-the-counter and prescription medicines only as told by your health care provider.  Keep all follow-up visits as told by your health care provider. This is important. Contact a health care provider if:  You have another gout attack.  You continue to have symptoms of a gout attack after10 days of treatment.  You have side effects from your medicines.  You have chills or a fever.  You have burning pain when you urinate.  You have pain in your lower back or belly. Get help right away if:  You have severe or uncontrolled pain.  You cannot urinate. This information is not intended to replace advice given to you by your health care provider. Make sure you discuss any questions you have with your health care provider. Document Released: 05/02/2000  Document Revised: 10/11/2015 Document Reviewed: 02/15/2015 Elsevier Interactive Patient Education  2017 [employer].

## 2017-03-18 NOTE — Progress Notes (Signed)
Todd Ferguson, is a 76 y.o. male  IZT:[id number]  [id number]:[id number]  DOB - Jun 01, 1940  Chief Complaint  Patient presents with  . Hypertension      Subjective:   Todd Ferguson is a 76 y.o. male with medical history of hypertension, prediabetes and dyslipidemia here today for hypertension who presents here today for a follow up visit and medication refills. Major complaint today is right big toe pain around the MCP joint. He does not have history of Gout, but he was seen recently somewhere and was told his uric acid level was high. He continues to smoke about 5 - 6 cigarettes per day. He has no headache, no fever. 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  ALLERGIES: No Known Allergies  PAST MEDICAL HISTORY: Past Medical History:  Diagnosis Date  . Bradycardia   . 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  . Hypertension   . Tobacco abuse     MEDICATIONS AT HOME: Prior to Admission medications   Medication Sig Start Date End Date Taking? Authorizing Provider  amLODipine (NORVASC) 10 MG tablet Take 1 tablet (10 mg total) by mouth daily. 03/18/17  Yes Tresa Garter, MD  aspirin EC 81 MG tablet Take 1 tablet (81 mg total) by mouth daily. 09/17/16  Yes Tresa Garter, MD  hydrochlorothiazide (HYDRODIURIL) 25 MG tablet Take 1 tablet (25 mg total) by mouth daily. 03/18/17  Yes Tresa Garter, MD  lisinopril (PRINIVIL,ZESTRIL) 40 MG tablet Take 1 tablet (40 mg total) by mouth daily. 03/18/17  Yes Tresa Garter, MD  Colchicine 0.6 MG CAPS Take 2 tablets now 03/18/17   Tresa Garter, MD  ibuprofen (ADVIL,MOTRIN) 800 MG tablet Take 1 tablet (800 mg total) by mouth every 8 (eight) hours as needed. 03/18/17   Tresa Garter, MD    Objective:   Vitals:   03/18/17 1433  BP: 115/63  Pulse: 62  Resp: 18  Temp: 98 F (36.7 C)  TempSrc: Oral  SpO2: 99%  Weight: 206 lb (93.4 kg)  Height: 5\' 4"  (1.626 m)   Exam General appearance : Awake, alert, not in any distress. Speech Clear. Not toxic looking HEENT: Atraumatic and Normocephalic, pupils equally reactive to light and accomodation Neck: Supple, no JVD. No cervical lymphadenopathy.  Chest: Good air entry bilaterally, no added sounds  CVS: S1 S2 regular, no murmurs.  Abdomen: Bowel sounds present, Non tender and not distended with no gaurding, rigidity or rebound. Extremities: B/L Lower Ext shows no edema, both legs are warm to touch Neurology: Awake alert, and oriented X 3, CN II-XII intact, Non focal Skin: No Rash Podagra +  Data Review Lab Results  Component Value Date   HGBA1C 5.9 07/25/2015   HGBA1C 6.4 08/07/2014   HGBA1C 6.1 (H) 10/28/2013    Assessment & Plan   1. Essential hypertension, benign  - lisinopril (PRINIVIL,ZESTRIL) 40 MG tablet; Take 1 tablet (40 mg total) by mouth daily.  Dispense: 90 tablet; Refill: 3 - hydrochlorothiazide (HYDRODIURIL) 25 MG tablet; Take 1 tablet (25 mg total) by mouth daily.  Dispense: 90 tablet; Refill: 3 - amLODipine (NORVASC) 10 MG tablet; Take 1 tablet (10 mg total) by mouth daily.  Dispense:  90 tablet; Refill: 3  We have discussed target BP range and blood pressure goal. I have advised patient to check BP regularly and to call us back or report to clinic if the numbers are consistently higher than 140/90. We discussed the importance of compliance with medical therapy and DASH diet recommended, consequences of uncontrolled hypertension discussed.  - continue current BP medications  2. Smoking  Todd Ferguson was counseled on the dangers of tobacco use, and was advised to quit. Reviewed strategies to maximize success, including removing cigarettes and smoking materials from environment, stress management  and support of family/friends.  3. Acute idiopathic gout involving toe of right foot  - ibuprofen (ADVIL,MOTRIN) 800 MG tablet; Take 1 tablet (800 mg total) by mouth every 8 (eight) hours as needed.  Dispense: 60 tablet; Refill: 0 - Colchicine 0.6 MG CAPS; Take 2 tablets now  Dispense: 2 capsule; Refill: 0  Patient have been counseled extensively about nutrition and exercise. Other issues discussed during this visit include: low cholesterol diet, weight control and daily exercise, importance of adherence with medications and regular follow-up. We also discussed long term complications of uncontrolled hypertension.   Return in about 6 months (around 09/15/2017) for Follow up HTN.  The patient was given clear instructions to go to ER or return to medical center if symptoms don't improve, worsen or new problems develop. The patient verbalized understanding. The patient was told to call to get lab results if they haven't heard anything in the next week.   This note has been created with Surveyor, quantity. Any transcriptional errors are unintentional.    Angelica Chessman, MD, [place], [place], [place], [place] and [place], [place]   03/18/2017, 3:46 PM

## 2017-04-21 MED FILL — HYDROCHLOROTHIAZIDE 25 MG T: 25 | 30 days supply | Qty: 30 | Fill #0

## 2017-04-21 MED FILL — LISINOPRIL 40 MG TAB: 40 | 30 days supply | Qty: 30 | Fill #4

## 2017-05-06 MED FILL — AMLODIPINE BESYLATE 10 MG T: 10 | 30 days supply | Qty: 30 | Fill #0

## 2017-07-02 ENCOUNTER — Ambulatory Visit: Payer: Medicare Other

## 2017-07-09 ENCOUNTER — Ambulatory Visit: Payer: Medicare Other | Attending: Internal Medicine | Admitting: Physician Assistant

## 2017-07-09 VITALS — BP 163/78 | HR 74 | Temp 98.2°F | Resp 16 | Ht 64.0 in | Wt 206.4 lb

## 2017-07-09 DIAGNOSIS — R221 Localized swelling, mass and lump, neck: Secondary | ICD-10-CM | POA: Diagnosis not present

## 2017-07-09 DIAGNOSIS — Z7982 Long term (current) use of aspirin: Secondary | ICD-10-CM | POA: Diagnosis not present

## 2017-07-09 DIAGNOSIS — I10 Essential (primary) hypertension: Secondary | ICD-10-CM | POA: Diagnosis not present

## 2017-07-09 DIAGNOSIS — Z79899 Other long term (current) drug therapy: Secondary | ICD-10-CM | POA: Diagnosis not present

## 2017-07-09 DIAGNOSIS — E010 Iodine-deficiency related diffuse (endemic) goiter: Secondary | ICD-10-CM

## 2017-07-09 MED ORDER — AMLODIPINE BESYLATE 10 MG PO TABS: 10.0000 mg | ORAL_TABLET | Freq: Every day | ORAL | 3 refills | Status: DC

## 2017-07-09 MED ORDER — HYDROCHLOROTHIAZIDE 25 MG PO TABS: 25.0000 mg | ORAL_TABLET | Freq: Every day | ORAL | 3 refills | Status: DC

## 2017-07-09 MED ORDER — LISINOPRIL 40 MG PO TABS: 40.0000 mg | ORAL_TABLET | Freq: Every day | ORAL | 3 refills | Status: DC

## 2017-07-09 NOTE — Progress Notes (Signed)
Patient stated he had swelling under his neck last week with no pain.   No swelling today.  Patient stated on Sunday he had a chill and did not take his temperature.

## 2017-07-09 NOTE — Patient Instructions (Addendum)
Check blood pressure 2-3 times/week and record and bring to your next visit.     Hypertension Hypertension is another name for high blood pressure. High blood pressure forces your heart to work harder to pump blood. This can cause problems over time. There are two numbers in a blood pressure reading. There is a top number (systolic) over a bottom number (diastolic). It is best to have a blood pressure below 120/80. Healthy choices can help lower your blood pressure. You may need medicine to help lower your blood pressure if:  Your blood pressure cannot be lowered with healthy choices.  Your blood pressure is higher than 130/80.  Follow these instructions at home: Eating and drinking  If directed, follow the DASH eating plan. This diet includes: ? Filling half of your plate at each meal with fruits and vegetables. ? Filling one quarter of your plate at each meal with whole grains. Whole grains include whole wheat pasta, brown rice, and whole grain bread. ? Eating or drinking low-fat dairy products, such as skim milk or low-fat yogurt. ? Filling one quarter of your plate at each meal with low-fat (lean) proteins. Low-fat proteins include fish, skinless chicken, eggs, beans, and tofu. ? Avoiding fatty meat, cured and processed meat, or chicken with skin. ? Avoiding premade or processed food.  Eat less than 1,500 mg of salt (sodium) a day.  Limit alcohol use to no more than 1 drink a day for nonpregnant women and 2 drinks a day for men. One drink equals 12 oz of beer, 5 oz of wine, or 1 oz of hard liquor. Lifestyle  Work with your doctor to stay at a healthy weight or to lose weight. Ask your doctor what the best weight is for you.  Get at least 30 minutes of exercise that causes your heart to beat faster (aerobic exercise) most days of the week. This may include walking, swimming, or biking.  Get at least 30 minutes of exercise that strengthens your muscles (resistance exercise) at least  3 days a week. This may include lifting weights or pilates.  Do not use any products that contain nicotine or tobacco. This includes cigarettes and e-cigarettes. If you need help quitting, ask your doctor.  Check your blood pressure at home as told by your doctor.  Keep all follow-up visits as told by your doctor. This is important. Medicines  Take over-the-counter and prescription medicines only as told by your doctor. Follow directions carefully.  Do not skip doses of blood pressure medicine. The medicine does not work as well if you skip doses. Skipping doses also puts you at risk for problems.  Ask your doctor about side effects or reactions to medicines that you should watch for. Contact a doctor if:  You think you are having a reaction to the medicine you are taking.  You have headaches that keep coming back (recurring).  You feel dizzy.  You have swelling in your ankles.  You have trouble with your vision. Get help right away if:  You get a very bad headache.  You start to feel confused.  You feel weak or numb.  You feel faint.  You get very bad pain in your: ? Chest. ? Belly (abdomen).  You throw up (vomit) more than once.  You have trouble breathing. Summary  Hypertension is another name for high blood pressure.  Making healthy choices can help lower blood pressure. If your blood pressure cannot be controlled with healthy choices, you may need to  take medicine. This information is not intended to replace advice given to you by your health care provider. Make sure you discuss any questions you have with your health care provider. Document Released: 10/22/2007 Document Revised: 04/02/2016 Document Reviewed: 04/02/2016 Elsevier Interactive Patient Education  [employer].

## 2017-07-09 NOTE — Progress Notes (Signed)
Patient ID: Todd Ferguson, male   DOB: 05-15-1941, 77 y.o.   MRN: [id number]   Todd Ferguson, is a 77 y.o. male  [id number]  [id number]  DOB - 1940/06/05  Subjective:  Chief Complaint and HPI: Todd Ferguson is a 77 y.o. male here today because someone told him last week that his neck seemed swollen or full.  He did not notice or agree with that observation himself.  He denies dysphagia.  He denies ST.  No change in appetite.  No reflux. No cough.  No hoarseness. No fever.  Head chills for a few minutes earlier in the week but resolved on its own.    Took BP meds this morning.  Needs RF.  No CP/HA/SOB/dizziness.    ROS:   Constitutional:  No f/c, No night sweats, No unexplained weight loss. EENT:  No vision changes, No blurry vision, No hearing changes. No other mouth, throat, or ear problems.  Respiratory: No cough, No SOB Cardiac: No CP, no palpitations GI:  No abd pain, No N/V/D. GU: No Urinary s/sx Musculoskeletal: No joint pain Neuro: No headache, no dizziness, no motor weakness.  Skin: No rash Endocrine:  No polydipsia. No polyuria.  Psych: Denies SI/HI  No problems updated.  ALLERGIES: No Known Allergies  PAST MEDICAL HISTORY: Past Medical History:  Diagnosis Date  . Bradycardia   . 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  . Hypertension   . Tobacco abuse     MEDICATIONS AT HOME: Prior to Admission medications   Medication Sig Start Date End Date Taking? Authorizing Provider  amLODipine (NORVASC) 10 MG tablet Take 1 tablet (10 mg total) by mouth daily. 07/09/17  Yes Argentina Donovan, PA-C  aspirin EC 81 MG tablet Take 1 tablet (81 mg total) by mouth daily. 09/17/16  Yes Tresa Garter, MD  hydrochlorothiazide (HYDRODIURIL) 25 MG tablet Take 1 tablet (25 mg total) by mouth daily. 07/09/17  Yes Freeman Caldron M, PA-C  lisinopril (PRINIVIL,ZESTRIL) 40 MG tablet Take 1 tablet (40 mg total) by  mouth daily. 07/09/17  Yes Argentina Donovan, PA-C  Colchicine 0.6 MG CAPS Take 2 tablets now Patient not taking: Reported on 07/09/2017 03/18/17   Tresa Garter, MD  ibuprofen (ADVIL,MOTRIN) 800 MG tablet Take 1 tablet (800 mg total) by mouth every 8 (eight) hours as needed. Patient not taking: Reported on 07/09/2017 03/18/17   Tresa Garter, MD     Objective:  EXAM:   Vitals:   07/09/17 0832  BP: (!) 163/78  Pulse: 74  Resp: 16  Temp: 98.2 F (36.8 C)  TempSrc: Oral  SpO2: 94%  Weight: 206 lb 6.4 oz (93.6 kg)  Height: 5\' 4"  (1.626 m)    General appearance : A&OX3. NAD. Non-toxic-appearing HEENT: Atraumatic and Normocephalic.  PERRLA. EOM intact.  TM congested B. Mouth-MMM, post pharynx WNL w/o erythema, + PND. Neck: supple, no JVD. No cervical lymphadenopathy. No detectable thyromegaly or discreet lesions in neck Chest/Lungs:  Breathing-non-labored, Good air entry bilaterally, breath sounds normal without rales, rhonchi, or wheezing  CVS: S1 S2 regular, no murmurs, gallops, rubs  Extremities: Bilateral Lower Ext shows no edema, both legs are warm to touch with = pulse throughout Neurology:  CN II-XII grossly intact, Non focal.   Psych:   J/I limited. Normal speech. blunted eye contact and affect.  Skin:  No Rash  Data Review Lab Results  Component Value  Date   HGBA1C 5.9 07/25/2015   HGBA1C 6.4 08/07/2014   HGBA1C 6.1 (H) 10/28/2013     Assessment & Plan   1. Thyromegaly No detectable fullness or nodules. - TSH  2. Essential hypertension, benign Uncontrolled.  Check blood pressure 2-3 times/week and record and bring to your next visit.   - Comprehensive metabolic panel - amLODipine (NORVASC) 10 MG tablet; Take 1 tablet (10 mg total) by mouth daily.  Dispense: 90 tablet; Refill: 3 - hydrochlorothiazide (HYDRODIURIL) 25 MG tablet; Take 1 tablet (25 mg total) by mouth daily.  Dispense: 90 tablet; Refill: 3 - lisinopril (PRINIVIL,ZESTRIL) 40 MG tablet;  Take 1 tablet (40 mg total) by mouth daily.  Dispense: 90 tablet; Refill: 3   Patient have been counseled extensively about nutrition and exercise  Return in about 6 weeks (around 08/20/2017) for assign new PCP; f/up uncontrolled htn.  The patient was given clear instructions to go to ER or return to medical center if symptoms don't improve, worsen or new problems develop. The patient verbalized understanding. The patient was told to call to get lab results if they haven't heard anything in the next week.     Freeman Caldron, PA-C [facility] and [facility] [place], [place]   07/09/2017, 8:46 AM

## 2017-07-10 LAB — COMPREHENSIVE METABOLIC PANEL
ALK PHOS: 51 IU/L (ref 39–117)
ALT: 10 IU/L (ref 0–44)
AST: 13 IU/L (ref 0–40)
Albumin/Globulin Ratio: 1.5 (ref 1.2–2.2)
Albumin: 4.4 g/dL (ref 3.5–4.8)
BILIRUBIN TOTAL: 0.2 mg/dL (ref 0.0–1.2)
BUN/Creatinine Ratio: 9 — ABNORMAL LOW (ref 10–24)
BUN: 10 mg/dL (ref 8–27)
CO2: 22 mmol/L (ref 20–29)
CREATININE: 1.09 mg/dL (ref 0.76–1.27)
Calcium: 9.7 mg/dL (ref 8.6–10.2)
Chloride: 102 mmol/L (ref 96–106)
GFR calc Af Amer: 77 mL/min/{1.73_m2} (ref >59)
GFR calc non Af Amer: 67 mL/min/{1.73_m2} (ref >59)
Globulin, Total: 3 g/dL (ref 1.5–4.5)
Glucose: 117 mg/dL — ABNORMAL HIGH (ref 65–99)
Potassium: 4.4 mmol/L (ref 3.5–5.2)
Sodium: 141 mmol/L (ref 134–144)
Total Protein: 7.4 g/dL (ref 6.0–8.5)

## 2017-07-10 LAB — TSH: TSH: 1.1 u[IU]/mL (ref 0.450–4.500)

## 2017-07-13 ENCOUNTER — Telehealth: Payer: Self-pay

## 2017-07-13 NOTE — Telephone Encounter (Signed)
CMA spoke to patient to inform on lab results.  Patient understood.  

## 2017-07-13 NOTE — Telephone Encounter (Signed)
-----   Message from Argentina Donovan, [place] sent at 07/10/2017 10:27 AM EST ----- Please call patient.  Labs look good including thyroid gland.  Follow-up as needed. Thanks, Freeman Caldron, PA-C

## 2017-07-21 MED FILL — LISINOPRIL 40 MG TAB: 40 | 30 days supply | Qty: 30 | Fill #0

## 2017-07-21 MED FILL — HYDROCHLOROTHIAZIDE 25 MG T: 25 | 30 days supply | Qty: 30 | Fill #0

## 2017-07-21 MED FILL — AMLODIPINE BESYLATE 10 MG T: 10 | 30 days supply | Qty: 30 | Fill #0

## 2017-08-20 ENCOUNTER — Ambulatory Visit: Payer: Medicare Other | Admitting: Internal Medicine

## 2017-11-08 ENCOUNTER — Emergency Department (HOSPITAL_COMMUNITY): Payer: Medicare Other

## 2017-11-08 ENCOUNTER — Emergency Department (HOSPITAL_COMMUNITY)
Admission: EM | Admit: 2017-11-08 | Discharge: 2017-11-08 | Disposition: A | Discharge status: Home / Self Care | Payer: Medicare Other | Attending: Emergency Medicine | Admitting: Emergency Medicine

## 2017-11-08 ENCOUNTER — Encounter (HOSPITAL_COMMUNITY): Payer: Self-pay | Admitting: Emergency Medicine

## 2017-11-08 DIAGNOSIS — F17210 Nicotine dependence, cigarettes, uncomplicated: Secondary | ICD-10-CM | POA: Diagnosis not present

## 2017-11-08 DIAGNOSIS — R509 Fever, unspecified: Secondary | ICD-10-CM | POA: Diagnosis present

## 2017-11-08 DIAGNOSIS — Z79899 Other long term (current) drug therapy: Secondary | ICD-10-CM | POA: Diagnosis not present

## 2017-11-08 DIAGNOSIS — R51 Headache: Secondary | ICD-10-CM | POA: Diagnosis not present

## 2017-11-08 DIAGNOSIS — I10 Essential (primary) hypertension: Secondary | ICD-10-CM | POA: Insufficient documentation

## 2017-11-08 DIAGNOSIS — N3000 Acute cystitis without hematuria: Secondary | ICD-10-CM | POA: Diagnosis not present

## 2017-11-08 DIAGNOSIS — G4489 Other headache syndrome: Secondary | ICD-10-CM | POA: Diagnosis not present

## 2017-11-08 DIAGNOSIS — N39498 Other specified urinary incontinence: Secondary | ICD-10-CM | POA: Diagnosis not present

## 2017-11-08 DIAGNOSIS — Z7982 Long term (current) use of aspirin: Secondary | ICD-10-CM | POA: Insufficient documentation

## 2017-11-08 DIAGNOSIS — R6883 Chills (without fever): Secondary | ICD-10-CM | POA: Diagnosis not present

## 2017-11-08 LAB — COMPREHENSIVE METABOLIC PANEL
ALT: 37 U/L (ref 17–63)
AST: 45 U/L — AB (ref 15–41)
Albumin: 3.8 g/dL (ref 3.5–5.0)
Alkaline Phosphatase: 42 U/L (ref 38–126)
Anion gap: 12 (ref 5–15)
BILIRUBIN TOTAL: 0.5 mg/dL (ref 0.3–1.2)
BUN: 10 mg/dL (ref 6–20)
CO2: 23 mmol/L (ref 22–32)
Calcium: 9 mg/dL (ref 8.9–10.3)
Chloride: 99 mmol/L — ABNORMAL LOW (ref 101–111)
Creatinine, Ser: 1.3 mg/dL — ABNORMAL HIGH (ref 0.61–1.24)
GFR, EST NON AFRICAN AMERICAN: 52 mL/min — AB (ref >60)
Glucose, Bld: 105 mg/dL — ABNORMAL HIGH (ref 65–99)
POTASSIUM: 3.4 mmol/L — AB (ref 3.5–5.1)
Sodium: 134 mmol/L — ABNORMAL LOW (ref 135–145)
TOTAL PROTEIN: 7.2 g/dL (ref 6.5–8.1)

## 2017-11-08 LAB — URINALYSIS, ROUTINE W REFLEX MICROSCOPIC
Bilirubin Urine: NEGATIVE
Glucose, UA: NEGATIVE mg/dL
Ketones, ur: 20 mg/dL — AB
Leukocytes, UA: NEGATIVE
Nitrite: NEGATIVE
PROTEIN: NEGATIVE mg/dL
Specific Gravity, Urine: 1.021 (ref 1.005–1.030)
pH: 5 (ref 5.0–8.0)

## 2017-11-08 LAB — CBC WITH DIFFERENTIAL/PLATELET
ABS IMMATURE GRANULOCYTES: 0 10*3/uL (ref 0.0–0.1)
BASOS ABS: 0 10*3/uL (ref 0.0–0.1)
BASOS PCT: 0 %
EOS PCT: 1 %
Eosinophils Absolute: 0 10*3/uL (ref 0.0–0.7)
HCT: 45.1 % (ref 39.0–52.0)
Hemoglobin: 14.6 g/dL (ref 13.0–17.0)
Immature Granulocytes: 0 %
Lymphocytes Relative: 15 %
Lymphs Abs: 0.7 10*3/uL (ref 0.7–4.0)
MCH: 28.6 pg (ref 26.0–34.0)
MCHC: 32.4 g/dL (ref 30.0–36.0)
MCV: 88.3 fL (ref 78.0–100.0)
MONO ABS: 0.4 10*3/uL (ref 0.1–1.0)
Monocytes Relative: 9 %
NEUTROS ABS: 3.4 10*3/uL (ref 1.7–7.7)
Neutrophils Relative %: 75 %
PLATELETS: 202 10*3/uL (ref 150–400)
RBC: 5.11 MIL/uL (ref 4.22–5.81)
RDW: 13.9 % (ref 11.5–15.5)
WBC: 4.4 10*3/uL (ref 4.0–10.5)

## 2017-11-08 LAB — PROTIME-INR
INR: 1.13
PROTHROMBIN TIME: 14.5 s (ref 11.4–15.2)

## 2017-11-08 LAB — I-STAT CG4 LACTIC ACID, ED: LACTIC ACID, VENOUS: 1.39 mmol/L (ref 0.5–1.9)

## 2017-11-08 MED ORDER — ACETAMINOPHEN 325 MG PO TABS
650.0000 mg | ORAL_TABLET | Freq: Once | ORAL | Status: AC
  Administered 2017-11-08: 650 mg via ORAL
  Filled 2017-11-08: qty 2

## 2017-11-08 MED ORDER — CEPHALEXIN 500 MG PO CAPS: 500.0000 mg | ORAL_CAPSULE | Freq: Three times a day (TID) | ORAL | 0 refills | Status: DC

## 2017-11-08 NOTE — ED Notes (Signed)
Patient transported to X-ray 

## 2017-11-08 NOTE — ED Triage Notes (Signed)
Pt transported from home by [facility] for reports of urinary incontinence for several days, HA with chills and fever today.

## 2017-11-08 NOTE — ED Provider Notes (Signed)
[facility] Provider Note   CSN: [id number] Arrival date & time: 11/08/17  0346     History   Chief Complaint Chief Complaint  Patient presents with  . Fever    HPI Todd Ferguson is a 77 y.o. male.  HPI  This is a 77 year old male who presents with chills.  Patient reports 1 day history of chills.  He denies any infectious symptoms including cough, congestion, sore throat, ear pain.  He does report increasing urinary incontinence.  No history of the same.  Denies any back pain.  Denies neck pain, fevers, chest pain, shortness of breath.  Unknown whether he has had fevers.  Past Medical History:  Diagnosis Date  . Bradycardia   . 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  . Hypertension   . Tobacco abuse     Patient Active Problem List   Diagnosis Date Noted  . Hospital discharge follow-up 09/17/2016  . Chest pain 07/17/2016  . Swelling of left hand 07/17/2016  . Healthcare maintenance 02/07/2016  . Gout involving toe of right foot 07/25/2015  . 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 Smoking 08/07/2014  . Prostate cancer screening 08/07/2014  . Colon cancer screening 08/07/2014  . Prediabetes 08/07/2014  . Special screening for malignant neoplasms, colon 01/19/2014  . Encounter for abdominal aortic aneurysm screening 01/19/2014  . Essential hypertension, benign 07/11/2013  . Hypertriglyceridemia 07/11/2013  . Unspecified vitamin D deficiency 07/11/2013    Past Surgical History:  Procedure Laterality Date  . APPENDECTOMY    . DENTAL SURGERY    . Removed right eye          Home Medications    Prior to Admission medications   Medication Sig Start Date End Date Taking? Authorizing Provider  amLODipine (NORVASC) 10 MG tablet Take 1 tablet (10 mg total) by mouth daily. 07/09/17  Yes Argentina Donovan, PA-C  aspirin EC 81 MG tablet Take 1 tablet  (81 mg total) by mouth daily. 09/17/16  Yes Tresa Garter, MD  hydrochlorothiazide (HYDRODIURIL) 25 MG tablet Take 1 tablet (25 mg total) by mouth daily. 07/09/17  Yes McClung, Dionne Bucy, PA-C  ibuprofen (ADVIL,MOTRIN) 800 MG tablet Take 1 tablet (800 mg total) by mouth every 8 (eight) hours as needed. Patient taking differently: Take 800 mg by mouth every 8 (eight) hours as needed.  03/18/17  Yes Tresa Garter, MD  lisinopril (PRINIVIL,ZESTRIL) 40 MG tablet Take 1 tablet (40 mg total) by mouth daily. 07/09/17  Yes Freeman Caldron M, PA-C  cephALEXin (KEFLEX) 500 MG capsule Take 1 capsule (500 mg total) by mouth 3 (three) times daily. 11/08/17   Olevia Westervelt, Barbette Hair, MD  Colchicine 0.6 MG CAPS Take 2 tablets now Patient not taking: Reported on 07/09/2017 03/18/17   Tresa Garter, MD    Family History Family History  Problem Relation Age of Onset  . Diabetes Brother   . Heart disease Mother   . Kidney disease Mother   . Colon cancer Neg Hx   . Esophageal cancer Neg Hx   . Rectal cancer Neg Hx   . Stomach cancer Neg Hx   . Prostate cancer Neg Hx   . Pancreatic cancer Neg Hx     Social History Social History   Tobacco Use  . Smoking status: Current Every Day Smoker    Packs/day: 0.25  Types: Cigarettes  . Smokeless tobacco: Never Used  Substance Use Topics  . Alcohol use: Yes    Comment: socially  . Drug use: Yes    Types: Marijuana    Comment: Marijuana about 1 week ago.     Allergies   Patient has no known allergies.   Review of Systems Review of Systems  Constitutional: Positive for chills and fever.  Respiratory: Negative for shortness of breath.   Cardiovascular: Negative for chest pain.  Gastrointestinal: Negative for abdominal pain, nausea and vomiting.  Genitourinary:       Incontinence  All other systems reviewed and are negative.    Physical Exam Updated Vital Signs BP 123/62 (BP Location: Right Arm)   Pulse 72   Temp (!) 100.8 F  (38.2 C) (Oral)   Resp 17   Ht 5\' 4"  (1.626 m)   Wt 90.7 kg (200 lb)   SpO2 93%   BMI 34.33 kg/m   Physical Exam  Constitutional: He is oriented to person, place, and time. He appears well-developed and well-nourished. No distress.  HENT:  Head: Normocephalic and atraumatic.  Neck: Normal range of motion. Neck supple.  Cardiovascular: Normal rate, regular rhythm and normal heart sounds.  No murmur heard. Pulmonary/Chest: Effort normal and breath sounds normal. No respiratory distress. He has no wheezes.  Abdominal: Soft. Bowel sounds are normal. There is no tenderness. There is no rebound.  Musculoskeletal: He exhibits no edema.  Lymphadenopathy:    He has no cervical adenopathy.  Neurological: He is alert and oriented to person, place, and time.  Skin: Skin is warm and dry.  Psychiatric: He has a normal mood and affect.  Nursing note and vitals reviewed.    ED Treatments / Results  Labs (all labs ordered are listed, but only abnormal results are displayed) Labs Reviewed  COMPREHENSIVE METABOLIC PANEL - Abnormal; Notable for the following components:      Result Value   Sodium 134 (*)    Potassium 3.4 (*)    Chloride 99 (*)    Glucose, Bld 105 (*)    Creatinine, Ser 1.30 (*)    AST 45 (*)    GFR calc non Af Amer 52 (*)    All other components within normal limits  URINALYSIS, ROUTINE W REFLEX MICROSCOPIC - Abnormal; Notable for the following components:   APPearance HAZY (*)    Hgb urine dipstick SMALL (*)    Ketones, ur 20 (*)    Bacteria, UA RARE (*)    All other components within normal limits  CULTURE, BLOOD (ROUTINE X 2)  CULTURE, BLOOD (ROUTINE X 2)  URINE CULTURE  CBC WITH DIFFERENTIAL/PLATELET  PROTIME-INR  I-STAT CG4 LACTIC ACID, ED    EKG None  Radiology Dg Chest 2 View  Result Date: 11/08/2017 CLINICAL DATA:  Urinary incontinence several days. Headache with fever and chills today. EXAM: CHEST - 2 VIEW COMPARISON:  07/17/2016 FINDINGS: Lungs are  clear. Cardiomediastinal silhouette is normal. There are mild degenerative changes of the spine. IMPRESSION: No active cardiopulmonary disease. Electronically Signed   By: Marin Olp M.D.   On: 11/08/2017 07:29    Procedures Procedures (including critical care time)  Medications Ordered in ED Medications  acetaminophen (TYLENOL) tablet 650 mg (650 mg Oral Given 11/08/17 2637)     Initial Impression / Assessment and Plan / ED Course  I have reviewed the triage vital signs and the nursing notes.  Pertinent labs & imaging results that were available during my care  of the patient were reviewed by me and considered in my medical decision making (see chart for details).     Patient presents with chills and incontinence.  He is overall nontoxic-appearing.  Vital signs are notable for a temperature of 100.8.  His physical exam is benign.  Lactate normal.  No significant leukocytosis.  Metabolic panel appears at baseline.  Patient has small hemoglobin in his urine was 0-5 red cells and 0-5 white cells with rare bacteria.  There is mucus present.  Given his symptoms, would elect to treat.  Urine culture sent.  Will treat with antibiotics and have him follow-up closely with his primary physician.  After history, exam, and medical workup I feel the patient has been appropriately medically screened and is safe for discharge home. Pertinent diagnoses were discussed with the patient. Patient was given return precautions.  Final Clinical Impressions(s) / ED Diagnoses   Final diagnoses:  Fever, unspecified fever cause  Acute cystitis without hematuria  Other urinary incontinence    ED Discharge Orders        Ordered    cephALEXin (KEFLEX) 500 MG capsule  3 times daily     11/08/17 0739       Branda Chaudhary, Barbette Hair, MD 11/08/17 [phone]

## 2017-11-08 NOTE — Discharge Instructions (Signed)
You were seen today for chills.  You had a temperature of 100.8.  Your work-up is largely reassuring.  You may have a urinary tract infection and given your recent urinary incontinence, like to treat.  You will be placed on antibiotics.  Follow-up with your primary doctor.  Take Tylenol as needed for any chills or body aches.

## 2017-11-08 NOTE — ED Notes (Signed)
Patient given discharge instructions and verbalized understanding.  Patient stable to discharge at this time.  Patient is alert and oriented to baseline.  No distressed noted at this time.  All belongings taken with the patient at discharge.   

## 2017-11-08 NOTE — ED Notes (Signed)
Pt is roomed. Family is assisting with cleaning patient up and placing him in clean clothes. Patient was given a pair of paper scrub pants

## 2017-11-08 NOTE — ED Notes (Signed)
ED Provider at bedside. 

## 2017-11-09 LAB — URINE CULTURE: CULTURE: NO GROWTH

## 2017-11-13 LAB — CULTURE, BLOOD (ROUTINE X 2)
CULTURE: NO GROWTH
CULTURE: NO GROWTH
SPECIAL REQUESTS: ADEQUATE

## 2018-03-04 ENCOUNTER — Other Ambulatory Visit: Payer: Self-pay

## 2018-03-04 ENCOUNTER — Encounter: Payer: Self-pay | Admitting: Family Medicine

## 2018-03-04 ENCOUNTER — Ambulatory Visit: Payer: Medicare Other | Attending: Family Medicine | Admitting: Family Medicine

## 2018-03-04 VITALS — BP 149/79 | HR 58 | Temp 98.0°F | Resp 18 | Ht 64.0 in | Wt 210.2 lb

## 2018-03-04 DIAGNOSIS — R7303 Prediabetes: Secondary | ICD-10-CM

## 2018-03-04 DIAGNOSIS — E790 Hyperuricemia without signs of inflammatory arthritis and tophaceous disease: Secondary | ICD-10-CM | POA: Diagnosis not present

## 2018-03-04 DIAGNOSIS — Z720 Tobacco use: Secondary | ICD-10-CM

## 2018-03-04 DIAGNOSIS — Z23 Encounter for immunization: Secondary | ICD-10-CM

## 2018-03-04 DIAGNOSIS — Z8249 Family history of ischemic heart disease and other diseases of the circulatory system: Secondary | ICD-10-CM | POA: Insufficient documentation

## 2018-03-04 DIAGNOSIS — Z9889 Other specified postprocedural states: Secondary | ICD-10-CM | POA: Insufficient documentation

## 2018-03-04 DIAGNOSIS — N289 Disorder of kidney and ureter, unspecified: Secondary | ICD-10-CM

## 2018-03-04 DIAGNOSIS — F17210 Nicotine dependence, cigarettes, uncomplicated: Secondary | ICD-10-CM | POA: Diagnosis not present

## 2018-03-04 DIAGNOSIS — Z833 Family history of diabetes mellitus: Secondary | ICD-10-CM | POA: Insufficient documentation

## 2018-03-04 DIAGNOSIS — R7401 Elevation of levels of liver transaminase levels: Secondary | ICD-10-CM

## 2018-03-04 DIAGNOSIS — Z7982 Long term (current) use of aspirin: Secondary | ICD-10-CM | POA: Diagnosis not present

## 2018-03-04 DIAGNOSIS — R740 Nonspecific elevation of levels of transaminase and lactic acid dehydrogenase [LDH]: Secondary | ICD-10-CM

## 2018-03-04 DIAGNOSIS — Z6836 Body mass index (BMI) 36.0-36.9, adult: Secondary | ICD-10-CM | POA: Diagnosis not present

## 2018-03-04 DIAGNOSIS — M109 Gout, unspecified: Secondary | ICD-10-CM | POA: Diagnosis not present

## 2018-03-04 DIAGNOSIS — I10 Essential (primary) hypertension: Secondary | ICD-10-CM

## 2018-03-04 DIAGNOSIS — E669 Obesity, unspecified: Secondary | ICD-10-CM | POA: Diagnosis not present

## 2018-03-04 DIAGNOSIS — Z79899 Other long term (current) drug therapy: Secondary | ICD-10-CM | POA: Diagnosis not present

## 2018-03-04 LAB — POCT GLYCOSYLATED HEMOGLOBIN (HGB A1C)
HbA1c POC (<> result, manual entry): 5.9 %
HbA1c, POC (controlled diabetic range): 5.9 % (ref 0.0–7.0)
HbA1c, POC (prediabetic range): 5.9 % (ref 5.7–6.4)
Hemoglobin A1C: 5.9 % — AB (ref 4.0–5.6)

## 2018-03-04 MED ORDER — AMLODIPINE BESYLATE 10 MG PO TABS: 10.0000 mg | ORAL_TABLET | Freq: Every day | ORAL | 1 refills | Status: DC

## 2018-03-04 MED ORDER — ASPIRIN EC 81 MG PO TBEC: 81.0000 mg | DELAYED_RELEASE_TABLET | Freq: Every day | ORAL | 6 refills | Status: DC

## 2018-03-04 MED ORDER — LISINOPRIL 40 MG PO TABS: 40.0000 mg | ORAL_TABLET | Freq: Every day | ORAL | 1 refills | Status: DC

## 2018-03-04 MED ORDER — HYDROCHLOROTHIAZIDE 25 MG PO TABS: 25.0000 mg | ORAL_TABLET | Freq: Every day | ORAL | 1 refills | Status: DC

## 2018-03-04 MED FILL — LISINOPRIL 40 MG TABLET: 40 | 90 days supply | Qty: 90 | Fill #0

## 2018-03-04 MED FILL — AMLODIPINE BESYLATE 10 MG T: 10 | 90 days supply | Qty: 90 | Fill #0

## 2018-03-04 MED FILL — HYDROCHLOROTHIAZIDE 25 MG T: 25 | 90 days supply | Qty: 90 | Fill #0

## 2018-03-04 NOTE — Progress Notes (Signed)
Pain: 0 Flu shot: yes Patient requested for all medications to be refilled.

## 2018-03-04 NOTE — Patient Instructions (Signed)
BMI for Adults Body mass index (BMI) is a number that is calculated from a person's weight and height. In most adults, the number is used to find how much of an adult's weight is made up of fat. BMI is not as accurate as a direct measure of body fat. How is BMI calculated? BMI is calculated by dividing weight in kilograms by height in meters squared. It can also be calculated by dividing weight in pounds by height in inches squared, then multiplying the resulting number by 703. Charts are available to help you find your BMI quickly and easily without doing this calculation. How is BMI interpreted? Health care professionals use BMI charts to identify whether an adult is underweight, at a normal weight, or overweight based on the following guidelines:  Underweight: BMI less than 18.5.  Normal weight: BMI between 18.5 and 24.9.  Overweight: BMI between 25 and 29.9.  Obese: BMI of 30 and above.  BMI is usually interpreted the same for males and females. Weight includes both fat and muscle, so someone with a muscular build, such as an athlete, may have a BMI that is higher than 24.9. In cases like these, BMI may not accurately depict body fat. To determine if excess body fat is the cause of a BMI of 25 or higher, further assessments may need to be done by a health care provider. Why is BMI a useful tool? BMI is used to identify a possible weight problem that may be related to a medical problem or may increase the risk for medical problems. BMI can also be used to promote changes to reach a healthy weight. This information is not intended to replace advice given to you by your health care provider. Make sure you discuss any questions you have with your health care provider. Document Released: 01/15/2004 Document Revised: 09/13/2015 Document Reviewed: 09/30/2013 Elsevier Interactive Patient Education  2018 [employer].  Hyperglycemia Hyperglycemia occurs when the level of sugar (glucose) in the  blood is too high. Glucose is a type of sugar that provides the body's main source of energy. Certain hormones (insulin and glucagon) control the level of glucose in the blood. Insulin lowers blood glucose, and glucagon increases blood glucose. Hyperglycemia can result from having too little insulin in the bloodstream, or from the body not responding normally to insulin. Hyperglycemia occurs most often in people who have diabetes (diabetes mellitus), but it can happen in people who do not have diabetes. It can develop quickly, and it can be life-threatening if it causes you to become severely dehydrated (diabetic ketoacidosis or hyperglycemic hyperosmolar state). Severe hyperglycemia is a medical emergency. What are the causes? If you have diabetes, hyperglycemia may be caused by:  Diabetes medicine.  Medicines that increase blood glucose or affect your diabetes control.  Not eating enough, or not eating often enough.  Changes in physical activity level.  Being sick or having an infection.  If you have prediabetes or undiagnosed diabetes:  Hyperglycemia may be caused by those conditions.  If you do not have diabetes, hyperglycemia may be caused by:  Certain medicines, including steroid medicines, beta-blockers, epinephrine, and thiazide diuretics.  Stress.  Serious illness.  Surgery.  Diseases of the pancreas.  Infection.  What increases the risk? Hyperglycemia is more likely to develop in people who have risk factors for diabetes, such as:  Having a family member with diabetes.  Having a gene for type 1 diabetes that is passed from parent to child (inherited).  Living in  an area with cold weather conditions.  Exposure to certain viruses.  Certain conditions in which the body's disease-fighting (immune) system attacks itself (autoimmune disorders).  Being overweight or obese.  Having an inactive (sedentary) lifestyle.  Having been diagnosed with insulin  resistance.  Having a history of prediabetes, gestational diabetes, or polycystic ovarian syndrome (PCOS).  Being of American-Indian, African-American, Hispanic/Latino, or Asian/Pacific Islander descent.  What are the signs or symptoms? Hyperglycemia may not cause any symptoms. If you do have symptoms, they may include early warning signs, such as:  Increased thirst.  Hunger.  Feeling very tired.  Needing to urinate more often than usual.  Blurry vision.  Other symptoms may develop if hyperglycemia gets worse, such as:  Dry mouth.  Loss of appetite.  Fruity-smelling breath.  Weakness.  Unexpected or rapid weight gain or weight loss.  Tingling or numbness in the hands or feet.  Headache.  Skin that does not quickly return to normal after being lightly pinched and released (poor skin turgor).  Abdominal pain.  Cuts or bruises that are slow to heal.  How is this diagnosed? Hyperglycemia is diagnosed with a blood test to measure your blood glucose level. This blood test is usually done while you are having symptoms. Your health care provider may also do a physical exam and review your medical history. You may have more tests to determine the cause of your hyperglycemia, such as:  A fasting blood glucose (FBG) test. You will not be allowed to eat (you will fast) for at least 8 hours before a blood sample is taken.  An A1c (hemoglobin A1c) blood test. This provides information about blood glucose control over the previous 2-3 months.  An oral glucose tolerance test (OGTT). This measures your blood glucose at two times: ? After fasting. This is your baseline blood glucose level. ? Two hours after drinking a beverage that contains glucose.  How is this treated? Treatment depends on the cause of your hyperglycemia. Treatment may include:  Taking medicine to regulate your blood glucose levels. If you take insulin or other diabetes medicines, your medicine or dosage may  be adjusted.  Lifestyle changes, such as exercising more, eating healthier foods, or losing weight.  Treating an illness or infection, if this caused your hyperglycemia.  Checking your blood glucose more often.  Stopping or reducing steroid medicines, if these caused your hyperglycemia.  If your hyperglycemia becomes severe and it results in hyperglycemic hyperosmolar state, you must be hospitalized and given IV fluids. Follow these instructions at home: General instructions  Take over-the-counter and prescription medicines only as told by your health care provider.  Do not use any products that contain nicotine or tobacco, such as cigarettes and e-cigarettes. If you need help quitting, ask your health care provider.  Limit alcohol intake to no more than 1 drink per day for nonpregnant women and 2 drinks per day for men. One drink equals 12 oz of beer, 5 oz of wine, or 1 oz of hard liquor.  Learn to manage stress. If you need help with this, ask your health care provider.  Keep all follow-up visits as told by your health care provider. This is important. Eating and drinking  Maintain a healthy weight.  Exercise regularly, as directed by your health care provider.  Stay hydrated, especially when you exercise, get sick, or spend time in hot temperatures.  Eat healthy foods, such as: ? Lean proteins. ? Complex carbohydrates. ? Fresh fruits and vegetables. ? Low-fat dairy  products. ? Healthy fats.  Drink enough fluid to keep your urine clear or pale yellow. If you have diabetes:   Make sure you know the symptoms of hyperglycemia.  Follow your diabetes management plan, as told by your health care provider. Make sure you: ? Take your insulin and medicines as directed. ? Follow your exercise plan. ? Follow your meal plan. Eat on time, and do not skip meals. ? Check your blood glucose as often as directed. Make sure to check your blood glucose before and after exercise. If  you exercise longer or in a different way than usual, check your blood glucose more often. ? Follow your sick day plan whenever you cannot eat or drink normally. Make this plan in advance with your health care provider.  Share your diabetes management plan with people in your workplace, school, and household.  Check your urine for ketones when you are ill and as told by your health care provider.  Carry a medical alert card or wear medical alert jewelry. Contact a health care provider if:  Your blood glucose is at or above 240 mg/dL (13.3 mmol/L) for 2 days in a row.  You have problems keeping your blood glucose in your target range.  You have frequent episodes of hyperglycemia. Get help right away if:  You have difficulty breathing.  You have a change in how you think, feel, or act (mental status).  You have nausea or vomiting that does not go away. These symptoms may represent a serious problem that is an emergency. Do not wait to see if the symptoms will go away. Get medical help right away. Call your local emergency services (911 in the [place] Do not drive yourself to the hospital. Summary  Hyperglycemia occurs when the level of sugar (glucose) in the blood is too high.  Hyperglycemia is diagnosed with a blood test to measure your blood glucose level. This blood test is usually done while you are having symptoms. Your health care provider may also do a physical exam and review your medical history.  If you have diabetes, follow your diabetes management plan as told by your health care provider.  Contact your health care provider if you have problems keeping your blood glucose in your target range. This information is not intended to replace advice given to you by your health care provider. Make sure you discuss any questions you have with your health care provider. Document Released: 10/29/2000 Document Revised: 01/21/2016 Document Reviewed: 01/21/2016 Elsevier Interactive  Patient Education  [employer].

## 2018-03-04 NOTE — Progress Notes (Signed)
Subjective:    Patient ID: Todd Ferguson, male    DOB: 1940-12-22, 77 y.o.   MRN: [id number]  HPI       77 year old male who was last seen in the office on 07/09/2017 who returns for follow-up of and continued medical management of chronic medical issues including hypertension and patient has had a history of bradycardia.  On review of chart, patient has been on amlodipine 10 mg, a daily 81 mg aspirin, hydrochlorothiazide and lisinopril for control of hypertension.  At his last visit, patient's blood pressure was elevated at 163/78.  Patient was seen in the emergency department on 11/08/2017 secondary to fever.  Patient had blood work done which showed potassium of 3.4, glucose of 105 and patient with elevated serum creatinine of 1.30.  Patient also had a mild increase in AST at 45. Patient had a normal CBC.  Also on review of chart, patient has had a history of elevated uric acid with level of 7.6 on 07/17/2016.  Patient has also had abnormal hemoglobin A1c done 07/25/2015 which was elevated at 5.9 consistent with prediabetes.      Patient reports that he feels well at today's visit.  Patient states that he took his last dose of his blood pressure medications this morning and needs a refill.  Patient denies headaches or dizziness related to his blood pressure.  Patient does report a history of gout but has had no recent flareups.  Patient states that in the past he would have pain and swelling in his left great toe.  Patient reports that he does not recall being told that he had prediabetes in the past.  Patient denies any increased thirst, urinary frequency.  Patient reports that he has decreased his amount of smoking since his last visit.  Patient states that he has decreased from 1 pack/day and is now down to half pack per day of cigarettes.  Patient states that he will continue to try and decrease his smoking but he is not yet ready to completely stop.  Patient would like to have his influenza  immunization at today's visit.  Patient denies any current increase in muscle or joint pain.  Patient was unaware of prior mild increase in a liver enzyme.  Patient denies abdominal pain.  Patient reports that he has had a prior colonoscopy. Past Medical History:  Diagnosis Date  . Bradycardia   . 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  . Hypertension   . Tobacco abuse   At today's visit, patient reports that he has had past issues with gout in the left great toe.  Patient reports injury to his right eye at age 67 and states that his right eye was later removed as an adult. Past Surgical History:  Procedure Laterality Date  . APPENDECTOMY    . DENTAL SURGERY    . Removed right eye     Family History  Problem Relation Age of Onset  . Diabetes Brother   . Heart disease Mother   . Kidney disease Mother   . Colon cancer Neg Hx   . Esophageal cancer Neg Hx   . Rectal cancer Neg Hx   . Stomach cancer Neg Hx   . Prostate cancer Neg Hx   . Pancreatic cancer Neg Hx    Social History   Tobacco Use  . Smoking status: Current Every Day Smoker    Packs/day: 0.25  Types: Cigarettes  . Smokeless tobacco: Never Used  . Tobacco comment: occassionally   Substance Use Topics  . Alcohol use: Yes    Comment: socially  . Drug use: Yes    Types: Marijuana    Comment: Marijuana about 1 week ago.  No Known Allergies    Review of Systems  Constitutional: Negative for chills, diaphoresis, fatigue and fever.  HENT: Negative for sore throat and trouble swallowing.   Eyes: Negative for photophobia, pain and redness.  Respiratory: Negative for cough and shortness of breath.   Cardiovascular: Negative for chest pain, palpitations and leg swelling.  Gastrointestinal: Negative for abdominal pain, blood in stool and nausea.  Endocrine: Negative for polydipsia, polyphagia and polyuria.  Genitourinary: Negative for dysuria and frequency.    Musculoskeletal: Negative for arthralgias, back pain, gait problem, joint swelling and myalgias.  Neurological: Negative for dizziness and headaches.       Objective:   Physical Exam BP (!) 149/79   Pulse (!) 58   Temp 98 F (36.7 C) (Oral)   Resp 18   Ht 5\' 4"  (1.626 m)   Wt 210 lb 3.2 oz (95.3 kg)   SpO2 95%   BMI 36.08 kg/m Vital signs and nurse's note reviewed General-well-nourished, well-developed, overweight for height older male in no acute distress.  Patient does have some mild difficulty getting up from a seated position and onto the exam table.  Patient denies any joint pain. Neck-supple, no lymphadenopathy, no thyromegaly, no carotid bruit Cardiovascular-regular rate and rhythm, borderline bradycardia Lungs-clear to auscultation bilaterally Abdomen-truncal obesity, soft and nontender Back-no CVA tenderness Extremities-no edema         Assessment & Plan:  1. Essential hypertension Patient's BP slightly above goal today, but will have patient continue his current medications. Patient will have a BMP, lipid panel and urine microalbumin at today's visit. Patient will have amlodipine, HCTZ, and lisinopril and ECASA 81 mg refilled.  - Basic Metabolic Panel - Lipid Panel - Microalbumin/Creatinine Ratio, Urine - amLODipine (NORVASC) 10 MG tablet; Take 1 tablet (10 mg total) by mouth daily. To lower blood pressure  Dispense: 90 tablet; Refill: 1 - aspirin EC 81 MG tablet; Take 1 tablet (81 mg total) by mouth daily.  Dispense: 30 tablet; Refill: 6 - hydrochlorothiazide (HYDRODIURIL) 25 MG tablet; Take 1 tablet (25 mg total) by mouth daily. To lower blood pressure  Dispense: 90 tablet; Refill: 1 - lisinopril (PRINIVIL,ZESTRIL) 40 MG tablet; Take 1 tablet (40 mg total) by mouth daily.  Dispense: 90 tablet; Refill: 1  2. Renal insufficiency Patient with Cr of 1.30 when he had blood work done 1/61/09 - Basic Metabolic Panel. Patient is encouraged to be compliant with his  medications and remain hydrated. Will repeat the Cr and check urine microalbumin. - Microalbumin/Creatinine Ratio, Urine  3. Elevated AST (SGOT) Patient with AST of 45 on labs done in June and this will recheck LFT's at today's visit.  - Hepatic Function Panel  4. Hyperuricemia Patient with past labs indicating that his uric acid levels have been elevated  - Uric Acid  5. Prediabetes On prior labs, patient has had Hgb A1c 2 years ago of 5.9 consistent with prediabetes.  Hgb A1c was done today which still showed an increase in Hgb A1c of 5.9. Discussed a low carbohydrate/low concentrated sweet diet as well as exercise as tolerated with the goal of weight house.  - HgB A1c  6. Obesity (BMI 30-39.9) Patient is encouraged to follow a healthy low carb  diet along with exercise as tolerated to help with weight loss  7. Tobacco use Discussed need for smoking cesstion  8. Need for immunization against influenza Patient was offered and agreed to receive influenza immunization which was provided at today's visit  An After Visit Summary was printed and given to the patient.  Return in about 6 months (around 09/03/2018) for blood pressure.

## 2018-03-05 ENCOUNTER — Telehealth: Payer: Self-pay

## 2018-03-05 LAB — MICROALBUMIN / CREATININE URINE RATIO
Creatinine, Urine: 42 mg/dL
Microalb/Creat Ratio: <7.1 mg/g{creat} (ref 0.0–30.0)
Microalbumin, Urine: <3 ug/mL

## 2018-03-05 LAB — HEPATIC FUNCTION PANEL
ALT: 12 IU/L (ref 0–44)
AST: 13 IU/L (ref 0–40)
Albumin: 4.4 g/dL (ref 3.5–4.8)
Alkaline Phosphatase: 49 IU/L (ref 39–117)
Bilirubin Total: <0.2 mg/dL (ref 0.0–1.2)
Bilirubin, Direct: 0.07 mg/dL (ref 0.00–0.40)
Total Protein: 7.3 g/dL (ref 6.0–8.5)

## 2018-03-05 LAB — BASIC METABOLIC PANEL WITH GFR
BUN/Creatinine Ratio: 12 (ref 10–24)
BUN: 12 mg/dL (ref 8–27)
CO2: 20 mmol/L (ref 20–29)
Calcium: 9.5 mg/dL (ref 8.6–10.2)
Chloride: 103 mmol/L (ref 96–106)
Creatinine, Ser: 0.99 mg/dL (ref 0.76–1.27)
GFR calc Af Amer: 86 mL/min/1.73
GFR calc non Af Amer: 75 mL/min/1.73
Glucose: 87 mg/dL (ref 65–99)
Potassium: 4.2 mmol/L (ref 3.5–5.2)
Sodium: 137 mmol/L (ref 134–144)

## 2018-03-05 LAB — URIC ACID: Uric Acid: 6.8 mg/dL (ref 3.7–8.6)

## 2018-03-05 LAB — LIPID PANEL
Chol/HDL Ratio: 3.7 ratio (ref 0.0–5.0)
Cholesterol, Total: 194 mg/dL (ref 100–199)
HDL: 52 mg/dL
LDL Calculated: 125 mg/dL — ABNORMAL HIGH (ref 0–99)
Triglycerides: 83 mg/dL (ref 0–149)
VLDL Cholesterol Cal: 17 mg/dL (ref 5–40)

## 2018-03-05 NOTE — Telephone Encounter (Signed)
-----   Message from Antony Blackbird, MD sent at 03/05/2018  1:46 PM EDT ----- Please notify patient that his BMP is normal. Uric acid level was 6.8-sometimes levels above 6 can cause gout flare-ups so if he develops joint pain and swelling he should schedule a follow-up visit. Lipid panel with a bad cholesterol level/LDL of 125. Urine creatinine/microalbumin was normal.

## 2018-03-05 NOTE — Telephone Encounter (Signed)
Patient was called, answered, verified dob and was given most recent lab results. Patient verbalized understanding and had no further questions.

## 2018-03-11 ENCOUNTER — Telehealth: Payer: Self-pay | Admitting: Family Medicine

## 2018-03-11 NOTE — Telephone Encounter (Signed)
Patient wife called stating that patient had blood taken out on his last OV and his whole hand is swollen. Patient is unable to move his hand.  Advised patient wife to take patient to the urgent care and she said she would like to speak with the nurse. Please f/u.

## 2018-03-11 NOTE — Telephone Encounter (Signed)
Attempt to contact patient in reference to left  Hand swelling. Unable to reach patient. Left message on voicemail to return call.

## 2018-03-12 NOTE — Telephone Encounter (Signed)
Attempt to call patient. Unable to reach. Left message on voicemail. Attempt to call pt wife: [phone] and [phone]. Unable to leave a message.

## 2018-04-02 ENCOUNTER — Other Ambulatory Visit: Payer: Self-pay

## 2018-11-14 IMAGING — US US ABDOMEN LIMITED
1 series · 14 of 25 positions shown · non-contrast
Comparison: None.

CLINICAL DATA: Epigastric pain for 1 day.  Elevated LFTs.

EXAM:
US ABDOMEN LIMITED - RIGHT UPPER QUADRANT

[Series 1: us abdomen limited · 0.23mm/px · 14 of 35 slices shown]
[im 1/35]
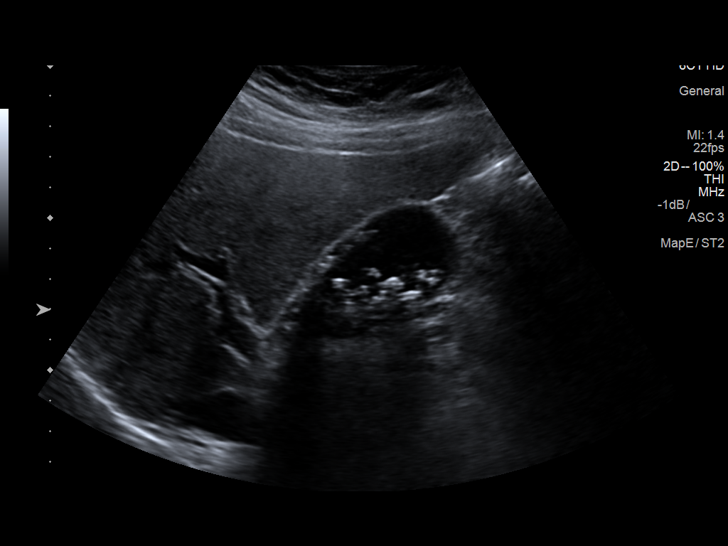
[im 3/35]
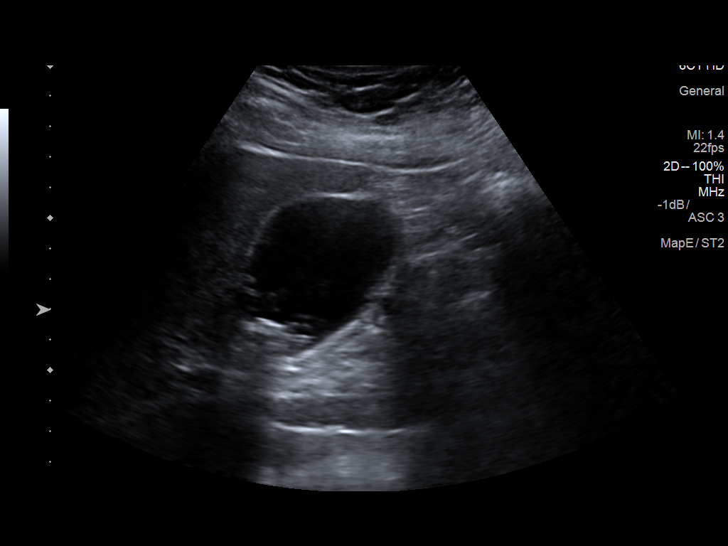
[im 6/35]
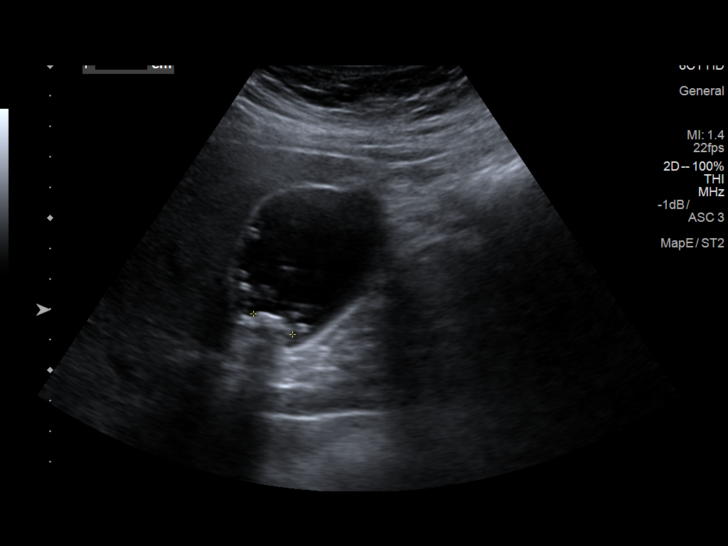
[im 9/35]
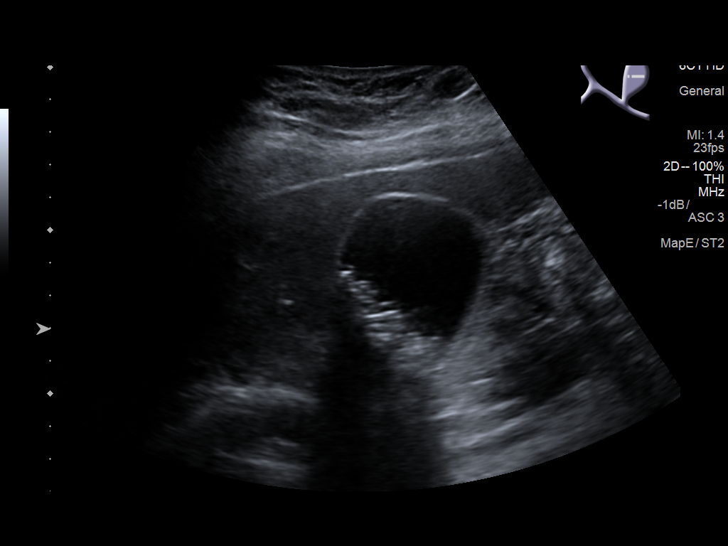
[im 12/35]
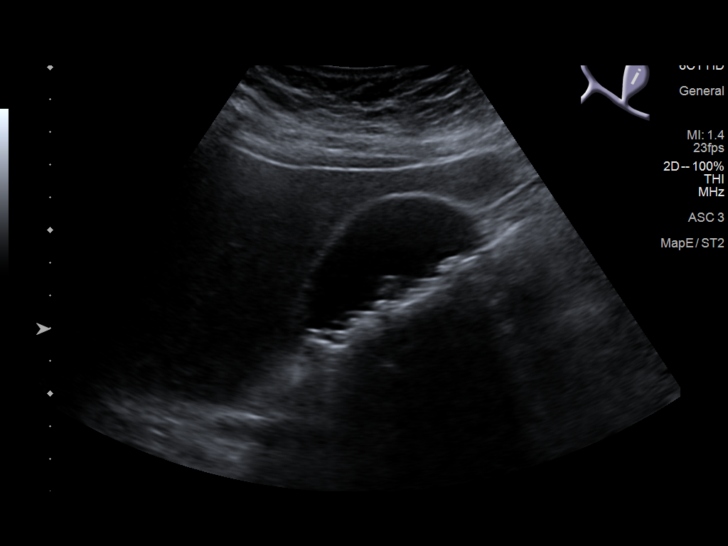
[im 13/35]
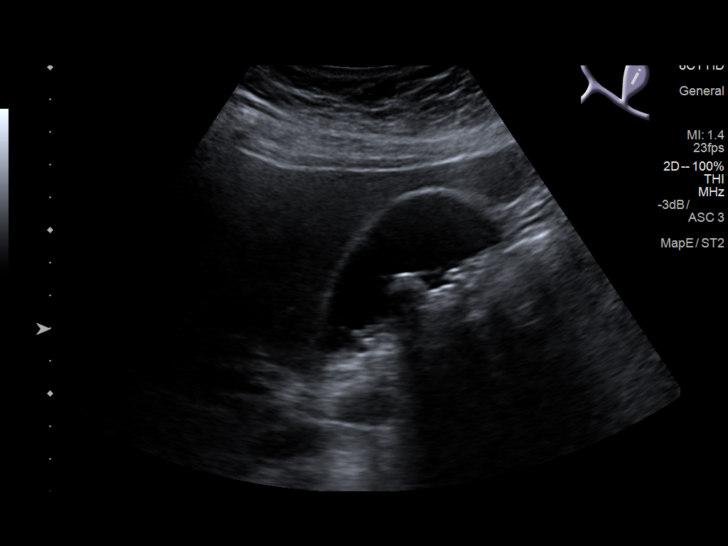
[im 16/35]
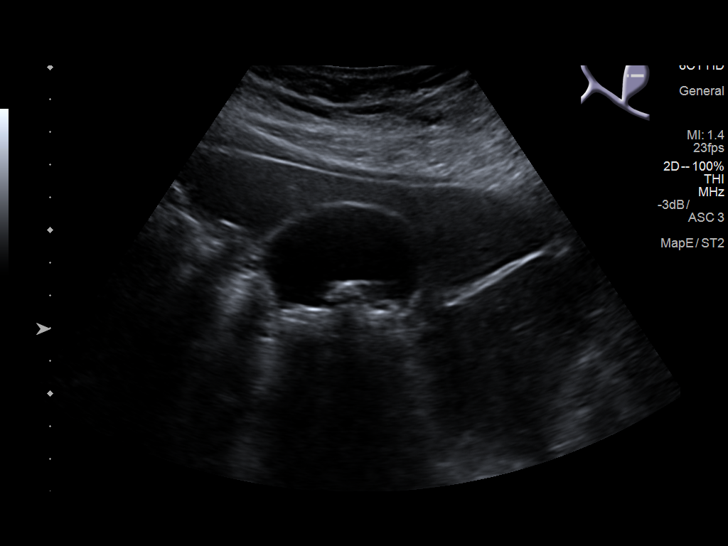
[im 19/35]
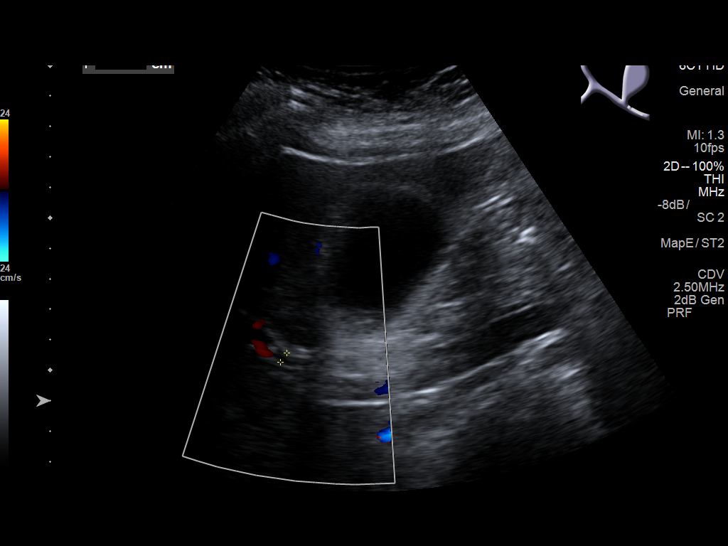
[im 22/35]
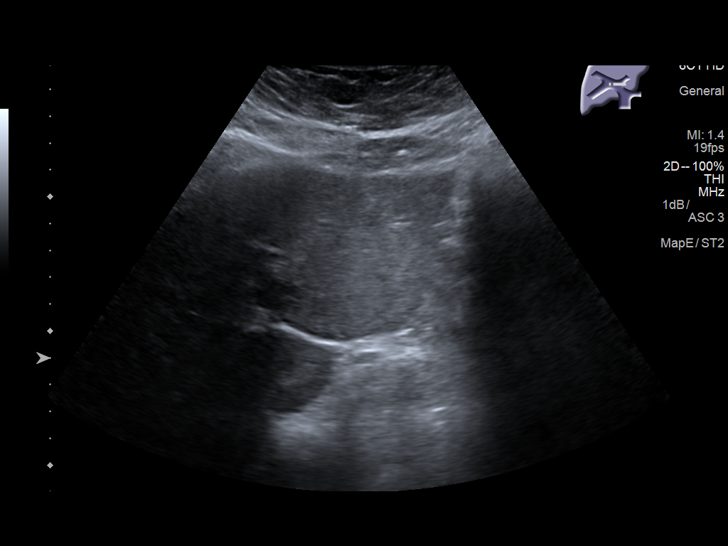
[im 23/35]
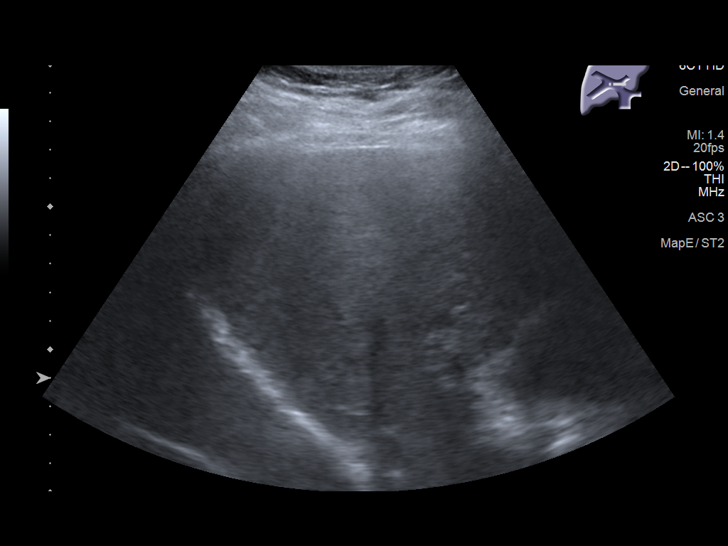
[im 26/35]
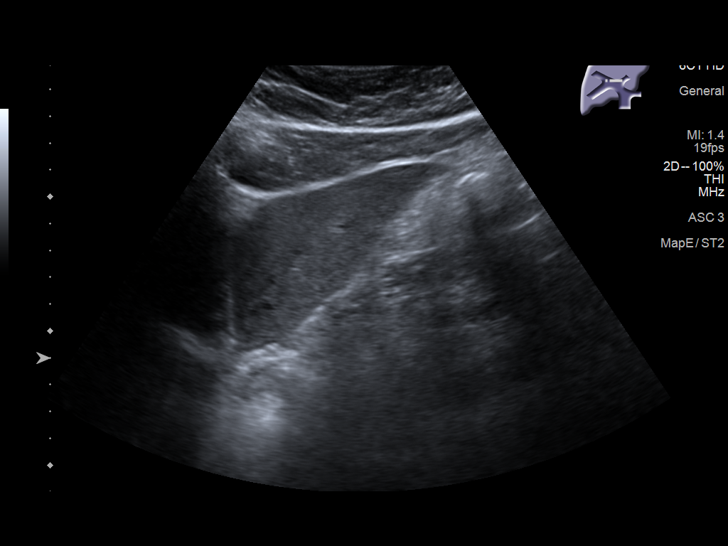
[im 29/35]
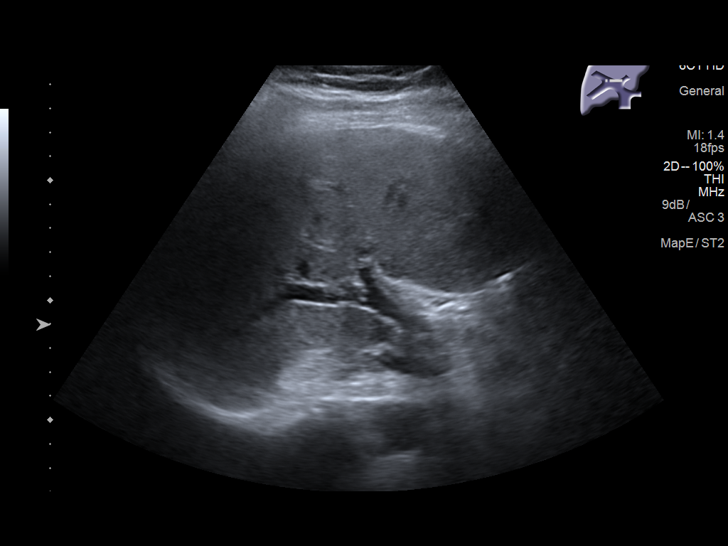
[im 32/35]
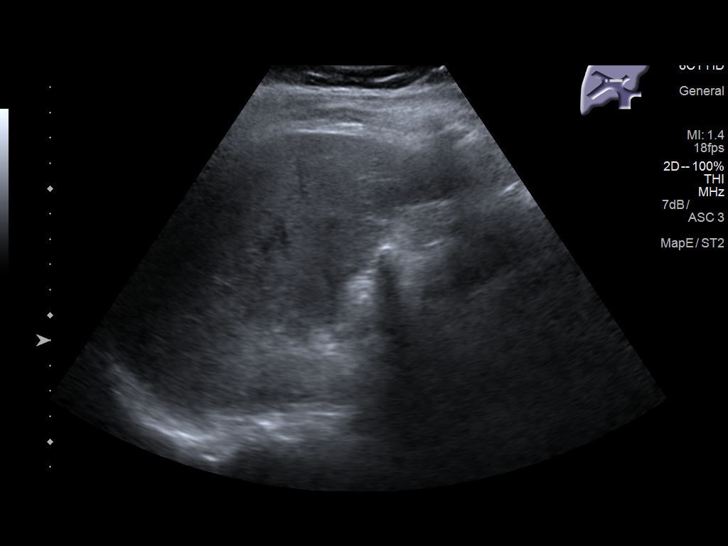
[im 35/35]
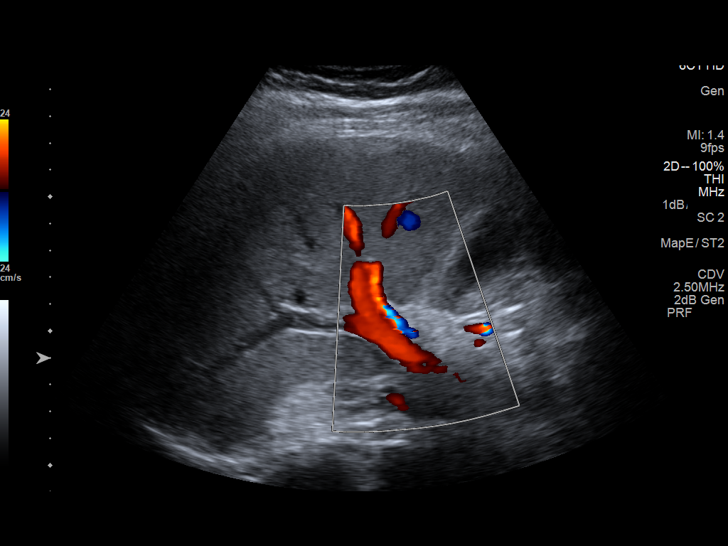

[14 of 25 positions shown; findings below may reference images not displayed]

FINDINGS: Gallbladder:

Multiple calculi, measuring up to 1.5 cm. No gallbladder mural
thickening or pericholecystic fluid. The patient was not tender over
the gallbladder.

Common bile duct:

Diameter: 3.7 mm

Liver:

No focal lesion identified. Within normal limits in parenchymal
echogenicity.
IMPRESSION: Cholelithiasis without sonographic evidence of acute cholecystitis.

## 2018-12-31 ENCOUNTER — Encounter (HOSPITAL_COMMUNITY): Payer: Self-pay

## 2018-12-31 ENCOUNTER — Other Ambulatory Visit: Payer: Self-pay

## 2018-12-31 ENCOUNTER — Ambulatory Visit (HOSPITAL_COMMUNITY)
Admission: EM | Admit: 2018-12-31 | Discharge: 2018-12-31 | Disposition: A | Discharge status: Home / Self Care | Payer: Medicare Other | Attending: Physician Assistant | Admitting: Physician Assistant

## 2018-12-31 DIAGNOSIS — Z760 Encounter for issue of repeat prescription: Secondary | ICD-10-CM

## 2018-12-31 DIAGNOSIS — I10 Essential (primary) hypertension: Secondary | ICD-10-CM | POA: Diagnosis not present

## 2018-12-31 DIAGNOSIS — M7989 Other specified soft tissue disorders: Secondary | ICD-10-CM

## 2018-12-31 MED ORDER — AMLODIPINE BESYLATE 10 MG PO TABS: 10.0000 mg | ORAL_TABLET | Freq: Every day | ORAL | 0 refills | Status: DC

## 2018-12-31 MED ORDER — HYDROCHLOROTHIAZIDE 25 MG PO TABS: 25.0000 mg | ORAL_TABLET | Freq: Every day | ORAL | 0 refills | Status: DC

## 2018-12-31 MED ORDER — LISINOPRIL 40 MG PO TABS: 40.0000 mg | ORAL_TABLET | Freq: Every day | ORAL | 0 refills | Status: DC

## 2018-12-31 NOTE — Discharge Instructions (Addendum)
Follow up with PCP If swelling returns, keep feet elevated or wear compression stockings Take medication as prescribed, refilled medication at patient request. Go to ER if you have ANY acutely worsening symptoms.

## 2018-12-31 NOTE — ED Triage Notes (Signed)
Pt states that over the last few weeks his hands and feet have been swelling. Denies any other symptoms. Would like to have a "good check up done."

## 2018-12-31 NOTE — ED Provider Notes (Signed)
Todd Ferguson    CSN: [id number] Arrival date & time: 12/31/18  1428      History   Chief Complaint Chief Complaint  Patient presents with  . Joint Swelling    HPI Todd Ferguson is a 78 y.o. male.   Patient here swelling of hands and feet b/l x 2 weeks ago.  He states he is not concerned about swelling at all, but states his daughter noticed it (She is not present during exam).  Patient denies chest pain, orthopnea, palpitations, cough, wheezing, SOB, n/v, diaphoresis.  He states he's feeling well.  He is requesting refill of HTN medication he has not run out but states he only has a few days left.       Past Medical History:  Diagnosis Date  . Bradycardia   . 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  . Hypertension   . Tobacco abuse     Patient Active Problem List   Diagnosis Date Noted  . Hospital discharge follow-up 09/17/2016  . Chest pain 07/17/2016  . Swelling of left hand 07/17/2016  . Healthcare maintenance 02/07/2016  . Gout involving toe of right foot 07/25/2015  . 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 Smoking 08/07/2014  . Prostate cancer screening 08/07/2014  . Colon cancer screening 08/07/2014  . Prediabetes 08/07/2014  . Special screening for malignant neoplasms, colon 01/19/2014  . Encounter for abdominal aortic aneurysm screening 01/19/2014  . Essential hypertension, benign 07/11/2013  . Hypertriglyceridemia 07/11/2013  . Unspecified vitamin D deficiency 07/11/2013    Past Surgical History:  Procedure Laterality Date  . APPENDECTOMY    . DENTAL SURGERY    . Removed right eye         Home Medications    Prior to Admission medications   Medication Sig Start Date End Date Taking? Authorizing Provider  amLODipine (NORVASC) 10 MG tablet Take 1 tablet (10 mg total) by mouth daily. To lower blood pressure 03/04/18  Yes Fulp, Cammie, MD  aspirin EC 81 MG  tablet Take 1 tablet (81 mg total) by mouth daily. 03/04/18  Yes Fulp, Cammie, MD  hydrochlorothiazide (HYDRODIURIL) 25 MG tablet Take 1 tablet (25 mg total) by mouth daily. To lower blood pressure 03/04/18  Yes Fulp, Cammie, MD  lisinopril (PRINIVIL,ZESTRIL) 40 MG tablet Take 1 tablet (40 mg total) by mouth daily. 03/04/18  Yes Fulp, Cammie, MD  amLODipine (NORVASC) 10 MG tablet Take 1 tablet (10 mg total) by mouth daily. 12/31/18   Peri Jefferson, PA-C  cephALEXin (KEFLEX) 500 MG capsule Take 1 capsule (500 mg total) by mouth 3 (three) times daily. Patient not taking: Reported on 03/04/2018 11/08/17   Horton, Barbette Hair, MD  Colchicine 0.6 MG CAPS Take 2 tablets now Patient not taking: Reported on 07/09/2017 03/18/17   Tresa Garter, MD  hydrochlorothiazide (HYDRODIURIL) 25 MG tablet Take 1 tablet (25 mg total) by mouth daily. 12/31/18   Peri Jefferson, PA-C  ibuprofen (ADVIL,MOTRIN) 800 MG tablet Take 1 tablet (800 mg total) by mouth every 8 (eight) hours as needed. Patient not taking: Reported on 03/04/2018 03/18/17   Tresa Garter, MD  lisinopril (ZESTRIL) 40 MG tablet Take 1 tablet (40 mg total) by mouth daily. 12/31/18   Peri Jefferson, PA-C    Family History Family History  Problem Relation Age of Onset  . Diabetes Brother   . Heart disease  Mother   . Kidney disease Mother   . Colon cancer Neg Hx   . Esophageal cancer Neg Hx   . Rectal cancer Neg Hx   . Stomach cancer Neg Hx   . Prostate cancer Neg Hx   . Pancreatic cancer Neg Hx     Social History Social History   Tobacco Use  . Smoking status: Current Every Day Smoker    Packs/day: 0.25    Types: Cigarettes  . Smokeless tobacco: Never Used  . Tobacco comment: occassionally   Substance Use Topics  . Alcohol use: Yes    Comment: socially  . Drug use: Yes    Types: Marijuana    Comment: Marijuana about 1 week ago.     Allergies   Patient has no known allergies.   Review of Systems Review of  Systems  Constitutional: Negative for activity change, diaphoresis, fatigue and unexpected weight change.  Respiratory: Negative for cough, chest tightness, shortness of breath and wheezing.   Cardiovascular: Negative for chest pain and palpitations.  Gastrointestinal: Negative for abdominal distention, abdominal pain, nausea and vomiting.  Musculoskeletal: Positive for joint swelling. Negative for arthralgias and myalgias.  Skin: Negative for color change, pallor and rash.  Neurological: Negative for dizziness, weakness, light-headedness, numbness and headaches.  Psychiatric/Behavioral: Negative for confusion and sleep disturbance.     Physical Exam Triage Vital Signs ED Triage Vitals  Enc Vitals Group     BP 12/31/18 1525 (!) 114/98     Pulse Rate 12/31/18 1525 72     Resp 12/31/18 1525 18     Temp 12/31/18 1525 98 F (36.7 C)     Temp src --      SpO2 12/31/18 1525 97 %     Weight --      Height --      Head Circumference --      Peak Flow --      Pain Score 12/31/18 1524 0     Pain Loc --      Pain Edu? --      Excl. in [place]? --    No data found.  Updated Vital Signs BP (!) 114/98   Pulse 72   Temp 98 F (36.7 C)   Resp 18   SpO2 97%   Visual Acuity Right Eye Distance:   Left Eye Distance:   Bilateral Distance:    Right Eye Near:   Left Eye Near:    Bilateral Near:     Physical Exam Vitals signs and nursing note reviewed.  Constitutional:      General: He is not in acute distress.    Appearance: Normal appearance. He is well-developed. He is obese. He is not ill-appearing, toxic-appearing or diaphoretic.  HENT:     Head: Normocephalic and atraumatic.     Nose: Nose normal.  Eyes:     General: No scleral icterus.    Extraocular Movements: Extraocular movements intact.     Conjunctiva/sclera: Conjunctivae normal.     Pupils: Pupils are equal, round, and reactive to light.  Neck:     Musculoskeletal: Neck supple.  Cardiovascular:     Rate and Rhythm:  Normal rate and regular rhythm. Occasional extrasystoles are present.    Pulses: Normal pulses.     Heart sounds: Murmur present. Systolic murmur present with a grade of 2/6. No gallop.   Pulmonary:     Effort: Pulmonary effort is normal. No respiratory distress.     Breath sounds: Normal breath sounds.  No wheezing, rhonchi or rales.  Abdominal:     General: There is no distension.     Palpations: Abdomen is soft.     Tenderness: There is no abdominal tenderness. There is no guarding.  Musculoskeletal: Normal range of motion.     Right lower leg: No edema.     Left lower leg: No edema.     Comments: Patient is wearing wedding ring which he can remove with ease  Skin:    General: Skin is warm and dry.     Capillary Refill: Capillary refill takes less than 2 seconds.  Neurological:     General: No focal deficit present.     Mental Status: He is alert and oriented to person, place, and time.     Motor: No weakness.     Coordination: Coordination normal.     Gait: Gait normal.     Deep Tendon Reflexes: Reflexes normal.  Psychiatric:        Mood and Affect: Mood normal.        Behavior: Behavior normal.      UC Treatments / Results  Labs (all labs ordered are listed, but only abnormal results are displayed) Labs Reviewed - No data to display  EKG   Radiology No results found.  Procedures Procedures (including critical care time)  Medications Ordered in UC Medications - No data to display  Initial Impression / Assessment and Plan / UC Course  I have reviewed the triage vital signs and the nursing notes.  Pertinent labs & imaging results that were available during my care of the patient were reviewed by me and considered in my medical decision making (see chart for details).     Patient request refill of HTN medication, refilled for patient. Advised him to follow up with PCP Wear compression stockings and elevate feet if swelling return Go to ER if you have any  acutely worsening symptoms, such as chest pain, shortness of breath. Final Clinical Impressions(s) / UC Diagnoses   Final diagnoses:  Swelling of limb  Essential hypertension, benign  Medication refill     Discharge Instructions     Follow up with PCP If swelling returns, keep feet elevated or wear compression stockings Take medication as prescribed, refilled medication at patient request. Go to ER if you have ANY acutely worsening symptoms.   ED Prescriptions    Medication Sig Dispense Auth. Provider   amLODipine (NORVASC) 10 MG tablet Take 1 tablet (10 mg total) by mouth daily. 30 tablet Peri Jefferson, PA-C   hydrochlorothiazide (HYDRODIURIL) 25 MG tablet Take 1 tablet (25 mg total) by mouth daily. 30 tablet Peri Jefferson, PA-C   lisinopril (ZESTRIL) 40 MG tablet Take 1 tablet (40 mg total) by mouth daily. 30 tablet Peri Jefferson, PA-C     Controlled Substance Prescriptions [place] Controlled Substance Registry consulted? Not Applicable   Peri Jefferson, Hershal Coria 12/31/18 1554

## 2019-01-03 MED FILL — HYDROCHLOROTHIAZIDE 25 MG T: 25 | 30 days supply | Qty: 30 | Fill #0

## 2019-01-03 MED FILL — LISINOPRIL 40 MG TABLET: 40 | 30 days supply | Qty: 30 | Fill #0

## 2019-01-03 MED FILL — AMLODIPINE BESYLATE 10 MG T: 10 | 30 days supply | Qty: 30 | Fill #0

## 2019-01-20 ENCOUNTER — Other Ambulatory Visit: Payer: Self-pay

## 2019-01-20 ENCOUNTER — Emergency Department (HOSPITAL_COMMUNITY)
Admission: EM | Admit: 2019-01-20 | Discharge: 2019-01-20 | Disposition: A | Discharge status: Home / Self Care | Payer: Medicare Other | Source: Home / Self Care | Attending: Emergency Medicine | Admitting: Emergency Medicine

## 2019-01-20 ENCOUNTER — Encounter (HOSPITAL_COMMUNITY): Payer: Self-pay | Admitting: Emergency Medicine

## 2019-01-20 DIAGNOSIS — N289 Disorder of kidney and ureter, unspecified: Secondary | ICD-10-CM | POA: Insufficient documentation

## 2019-01-20 DIAGNOSIS — I495 Sick sinus syndrome: Secondary | ICD-10-CM | POA: Diagnosis not present

## 2019-01-20 DIAGNOSIS — F1290 Cannabis use, unspecified, uncomplicated: Secondary | ICD-10-CM | POA: Insufficient documentation

## 2019-01-20 DIAGNOSIS — R531 Weakness: Secondary | ICD-10-CM | POA: Diagnosis not present

## 2019-01-20 DIAGNOSIS — I10 Essential (primary) hypertension: Secondary | ICD-10-CM | POA: Insufficient documentation

## 2019-01-20 DIAGNOSIS — F17210 Nicotine dependence, cigarettes, uncomplicated: Secondary | ICD-10-CM | POA: Insufficient documentation

## 2019-01-20 DIAGNOSIS — Z79899 Other long term (current) drug therapy: Secondary | ICD-10-CM | POA: Insufficient documentation

## 2019-01-20 DIAGNOSIS — R001 Bradycardia, unspecified: Secondary | ICD-10-CM | POA: Diagnosis not present

## 2019-01-20 DIAGNOSIS — Z7982 Long term (current) use of aspirin: Secondary | ICD-10-CM | POA: Insufficient documentation

## 2019-01-20 LAB — BASIC METABOLIC PANEL
Anion gap: 11 (ref 5–15)
BUN: 12 mg/dL (ref 8–23)
CO2: 23 mmol/L (ref 22–32)
Calcium: 8.7 mg/dL — ABNORMAL LOW (ref 8.9–10.3)
Chloride: 100 mmol/L (ref 98–111)
Creatinine, Ser: 1.31 mg/dL — ABNORMAL HIGH (ref 0.61–1.24)
GFR calc Af Amer: >60 mL/min (ref >60)
GFR calc non Af Amer: 53 mL/min — ABNORMAL LOW (ref >60)
Glucose, Bld: 183 mg/dL — ABNORMAL HIGH (ref 70–99)
Potassium: 3.7 mmol/L (ref 3.5–5.1)
Sodium: 134 mmol/L — ABNORMAL LOW (ref 135–145)

## 2019-01-20 LAB — CBC
HCT: 39.9 % (ref 39.0–52.0)
Hemoglobin: 13.4 g/dL (ref 13.0–17.0)
MCH: 30.2 pg (ref 26.0–34.0)
MCHC: 33.6 g/dL (ref 30.0–36.0)
MCV: 89.9 fL (ref 80.0–100.0)
Platelets: 257 10*3/uL (ref 150–400)
RBC: 4.44 MIL/uL (ref 4.22–5.81)
RDW: 13.8 % (ref 11.5–15.5)
WBC: 13 10*3/uL — ABNORMAL HIGH (ref 4.0–10.5)
nRBC: 0 % (ref 0.0–0.2)

## 2019-01-20 LAB — URINALYSIS, ROUTINE W REFLEX MICROSCOPIC
Bilirubin Urine: NEGATIVE
Glucose, UA: NEGATIVE mg/dL
Hgb urine dipstick: NEGATIVE
Ketones, ur: NEGATIVE mg/dL
Leukocytes,Ua: NEGATIVE
Nitrite: NEGATIVE
Protein, ur: NEGATIVE mg/dL
Specific Gravity, Urine: 1.02 (ref 1.005–1.030)
pH: 5 (ref 5.0–8.0)

## 2019-01-20 LAB — CBG MONITORING, ED: Glucose-Capillary: 100 mg/dL — ABNORMAL HIGH (ref 70–99)

## 2019-01-20 MED ORDER — SODIUM CHLORIDE 0.9% FLUSH: 3.0000 mL | Freq: Once | INTRAVENOUS | Status: DC

## 2019-01-20 NOTE — ED Triage Notes (Signed)
Patient from home, having strong urine smell, chills for the last few days.  Patient feeling fatigued.  VSS

## 2019-01-20 NOTE — Discharge Instructions (Signed)
Your urine is a darker color and smells stronger because of mild dehydration which is affecting your kidney function.  For now stop taking the hydrochlorothiazide.  Stay on a low-salt diet.  To help prevent swelling in your legs elevate them above your heart during the day for a few hours.  See your doctor next week as scheduled.

## 2019-01-20 NOTE — ED Provider Notes (Signed)
[place] EMERGENCY DEPARTMENT Provider Note   CSN: [id number] Arrival date & time: 01/20/19  0149     History   Chief Complaint Chief Complaint  Patient presents with  . Fatigue    HPI Todd Ferguson is a 78 y.o. male.     HPI   Patient is concerned that he is urine, "smells strong."  He has noticed this for couple of days.  Denies dysuria, urinary frequency, hematuria or flank pain.  He has intermittent leg swelling that improves when he elevates them.  He denies fever, chills, cough, shortness of breath, chest pain, abdominal or back pain.  He is taking his usual prescribed medications.  He has a follow-up appointment with his doctor in 1 week.  There are no other known modifying factors.  Past Medical History:  Diagnosis Date  . Bradycardia   . 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  . Hypertension   . Tobacco abuse     Patient Active Problem List   Diagnosis Date Noted  . Hospital discharge follow-up 09/17/2016  . Chest pain 07/17/2016  . Swelling of left hand 07/17/2016  . Healthcare maintenance 02/07/2016  . Gout involving toe of right foot 07/25/2015  . 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 Smoking 08/07/2014  . Prostate cancer screening 08/07/2014  . Colon cancer screening 08/07/2014  . Prediabetes 08/07/2014  . Special screening for malignant neoplasms, colon 01/19/2014  . Encounter for abdominal aortic aneurysm screening 01/19/2014  . Essential hypertension, benign 07/11/2013  . Hypertriglyceridemia 07/11/2013  . Unspecified vitamin D deficiency 07/11/2013    Past Surgical History:  Procedure Laterality Date  . APPENDECTOMY    . DENTAL SURGERY    . Removed right eye          Home Medications    Prior to Admission medications   Medication Sig Start Date End Date Taking? Authorizing Provider  amLODipine (NORVASC) 10 MG tablet Take 1 tablet (10 mg  total) by mouth daily. To lower blood pressure 03/04/18   Fulp, Cammie, MD  amLODipine (NORVASC) 10 MG tablet Take 1 tablet (10 mg total) by mouth daily. 12/31/18   Peri Jefferson, PA-C  aspirin EC 81 MG tablet Take 1 tablet (81 mg total) by mouth daily. 03/04/18   Fulp, Cammie, MD  cephALEXin (KEFLEX) 500 MG capsule Take 1 capsule (500 mg total) by mouth 3 (three) times daily. Patient not taking: Reported on 03/04/2018 11/08/17   Horton, Barbette Hair, MD  Colchicine 0.6 MG CAPS Take 2 tablets now Patient not taking: Reported on 07/09/2017 03/18/17   Tresa Garter, MD  ibuprofen (ADVIL,MOTRIN) 800 MG tablet Take 1 tablet (800 mg total) by mouth every 8 (eight) hours as needed. Patient not taking: Reported on 03/04/2018 03/18/17   Tresa Garter, MD  lisinopril (PRINIVIL,ZESTRIL) 40 MG tablet Take 1 tablet (40 mg total) by mouth daily. 03/04/18   Fulp, Cammie, MD  lisinopril (ZESTRIL) 40 MG tablet Take 1 tablet (40 mg total) by mouth daily. 12/31/18   Peri Jefferson, PA-C  hydrochlorothiazide (HYDRODIURIL) 25 MG tablet Take 1 tablet (25 mg total) by mouth daily. 12/31/18 01/20/19  Peri Jefferson, PA-C    Family History Family History  Problem Relation Age of Onset  . Diabetes Brother   . Heart disease Mother   . Kidney disease Mother   . Colon cancer Neg Hx   .  Esophageal cancer Neg Hx   . Rectal cancer Neg Hx   . Stomach cancer Neg Hx   . Prostate cancer Neg Hx   . Pancreatic cancer Neg Hx     Social History Social History   Tobacco Use  . Smoking status: Current Every Day Smoker    Packs/day: 0.25    Types: Cigarettes  . Smokeless tobacco: Never Used  . Tobacco comment: occassionally   Substance Use Topics  . Alcohol use: Yes    Comment: socially  . Drug use: Yes    Types: Marijuana    Comment: Marijuana about 1 week ago.     Allergies   Patient has no known allergies.   Review of Systems Review of Systems  All other systems reviewed and are  negative.    Physical Exam Updated Vital Signs BP (!) 143/71 (BP Location: Right Arm)   Pulse 64   Temp 98.5 F (36.9 C) (Oral)   Resp 20   SpO2 96%   Physical Exam Vitals signs and nursing note reviewed.  Constitutional:      Appearance: He is well-developed.  HENT:     Head: Normocephalic and atraumatic.     Right Ear: External ear normal.     Left Ear: External ear normal.  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  Left eye is normal.  Neck:     Musculoskeletal: Normal range of motion and neck supple.     Trachea: Phonation normal.  Cardiovascular:     Rate and Rhythm: Normal rate and regular rhythm.     Heart sounds: Normal heart sounds.  Pulmonary:     Effort: Pulmonary effort is normal.     Breath sounds: Normal breath sounds.  Abdominal:     General: There is no distension.     Palpations: Abdomen is soft.     Tenderness: There is no abdominal tenderness.  Musculoskeletal: Normal range of motion.        General: No swelling.     Right lower leg: No edema.     Left lower leg: No edema.  Skin:    General: Skin is warm and dry.  Neurological:     Mental Status: He is alert and oriented to person, place, and time.     Cranial Nerves: No cranial nerve deficit.     Sensory: No sensory deficit.     Motor: No abnormal muscle tone.     Coordination: Coordination normal.  Psychiatric:        Mood and Affect: Mood normal.        Behavior: Behavior normal.        Thought Content: Thought content normal.        Judgment: Judgment normal.      ED Treatments / Results  Labs (all labs ordered are listed, but only abnormal results are displayed) Labs Reviewed  BASIC METABOLIC PANEL - Abnormal; Notable for the following components:      Result Value   Sodium 134 (*)    Glucose, Bld 183 (*)    Creatinine, Ser 1.31 (*)    Calcium 8.7 (*)    GFR calc non Af Amer 53 (*)    All other components within normal limits  CBC - Abnormal; Notable for the  following components:   WBC 13.0 (*)    All other components within normal limits  CBG MONITORING, ED - Abnormal; Notable for the following components:   Glucose-Capillary 100 (*)  All other components within normal limits  URINALYSIS, ROUTINE W REFLEX MICROSCOPIC  CBG MONITORING, ED    EKG None  Radiology No results found.  Procedures Procedures (including critical care time)  Medications Ordered in ED Medications  sodium chloride flush (NS) 0.9 % injection 3 mL (has no administration in time range)     Initial Impression / Assessment and Plan / ED Course  I have reviewed the triage vital signs and the nursing notes.  Pertinent labs & imaging results that were available during my care of the patient were reviewed by me and considered in my medical decision making (see chart for details).  Clinical Course as of Jan 20 1031  Thu Jan 20, 2019  [id number] Normal except sodium low, glucose 4, creatinine high, calcium low, GFR low  Basic metabolic panel(!) [EW]  [id number] Normal  Urinalysis, Routine w reflex microscopic [EW]  0850 Normal except white count high  CBC(!) [EW]    Clinical Course User Index [EW] Daleen Bo, MD        Patient Vitals for the past 24 hrs:  BP Temp Temp src Pulse Resp SpO2  01/20/19 0820 (!) 143/71 98.5 F (36.9 C) Oral 64 20 96 %  01/20/19 0559 119/70 98.9 F (37.2 C) Oral 69 18 97 %  01/20/19 0152 (!) 147/63 99.4 F (37.4 C) Oral 84 18 97 %    10:24 AM Reevaluation with update and discussion. After initial assessment and treatment, an updated evaluation reveals patient was hungry, has eaten and feels better.  Findings discussed with the patient and all questions were answered. Daleen Bo   Medical Decision Making: Evaluation consistent with mild renal insufficiency associated with use of HCTZ.  No clinical evidence for acute infection.  Reassuring labs.  Doubt metabolic instability, serious bacterial infection or impending vascular  collapse.  Patient is currently without leg edema.  No indication for further ED treatment this time.  CRITICAL CARE-no Performed by: Daleen Bo   Nursing Notes Reviewed/ Care Coordinated Applicable Imaging Reviewed Interpretation of Laboratory Data incorporated into ED treatment  The patient appears reasonably screened and/or stabilized for discharge and I doubt any other medical condition or other [facility] requiring further screening, evaluation, or treatment in the ED at this time prior to discharge.  Plan: Home Medications-hold HCTZ, continue usual medications; Home Treatments-regular diet, gradual advance activity; return here if the recommended treatment, does not improve the symptoms; Recommended follow up-PCP as scheduled in 1 week.     Final Clinical Impressions(s) / ED Diagnoses   Final diagnoses:  Renal insufficiency    ED Discharge Orders    None       Daleen Bo, MD 01/20/19 1032

## 2019-01-20 NOTE — ED Notes (Signed)
gave pt some coke and ice and a [place] sandwich

## 2019-01-23 ENCOUNTER — Encounter (HOSPITAL_COMMUNITY): Payer: Self-pay | Admitting: Emergency Medicine

## 2019-01-23 ENCOUNTER — Inpatient Hospital Stay (HOSPITAL_COMMUNITY)
Admission: EM | Admit: 2019-01-23 | Discharge: 2019-01-26 | DRG: 309 | Disposition: A | Discharge status: Home / Self Care | Payer: Medicare Other | Attending: Student in an Organized Health Care Education/Training Program | Admitting: Student in an Organized Health Care Education/Training Program

## 2019-01-23 ENCOUNTER — Other Ambulatory Visit: Payer: Self-pay

## 2019-01-23 ENCOUNTER — Emergency Department (HOSPITAL_COMMUNITY): Payer: Medicare Other

## 2019-01-23 DIAGNOSIS — R609 Edema, unspecified: Secondary | ICD-10-CM | POA: Diagnosis not present

## 2019-01-23 DIAGNOSIS — I959 Hypotension, unspecified: Secondary | ICD-10-CM | POA: Diagnosis present

## 2019-01-23 DIAGNOSIS — E781 Pure hyperglyceridemia: Secondary | ICD-10-CM | POA: Diagnosis not present

## 2019-01-23 DIAGNOSIS — I495 Sick sinus syndrome: Secondary | ICD-10-CM | POA: Diagnosis not present

## 2019-01-23 DIAGNOSIS — M109 Gout, unspecified: Secondary | ICD-10-CM | POA: Diagnosis present

## 2019-01-23 DIAGNOSIS — R001 Bradycardia, unspecified: Secondary | ICD-10-CM | POA: Diagnosis present

## 2019-01-23 DIAGNOSIS — K921 Melena: Secondary | ICD-10-CM | POA: Diagnosis present

## 2019-01-23 DIAGNOSIS — Z6835 Body mass index (BMI) 35.0-35.9, adult: Secondary | ICD-10-CM | POA: Diagnosis not present

## 2019-01-23 DIAGNOSIS — Z79899 Other long term (current) drug therapy: Secondary | ICD-10-CM | POA: Diagnosis not present

## 2019-01-23 DIAGNOSIS — E8809 Other disorders of plasma-protein metabolism, not elsewhere classified: Secondary | ICD-10-CM | POA: Diagnosis present

## 2019-01-23 DIAGNOSIS — E669 Obesity, unspecified: Secondary | ICD-10-CM | POA: Diagnosis present

## 2019-01-23 DIAGNOSIS — Z86010 Personal history of colonic polyps: Secondary | ICD-10-CM | POA: Diagnosis not present

## 2019-01-23 DIAGNOSIS — R29810 Facial weakness: Secondary | ICD-10-CM | POA: Diagnosis not present

## 2019-01-23 DIAGNOSIS — E876 Hypokalemia: Secondary | ICD-10-CM | POA: Diagnosis present

## 2019-01-23 DIAGNOSIS — I10 Essential (primary) hypertension: Secondary | ICD-10-CM | POA: Diagnosis not present

## 2019-01-23 DIAGNOSIS — Z7982 Long term (current) use of aspirin: Secondary | ICD-10-CM

## 2019-01-23 DIAGNOSIS — R195 Other fecal abnormalities: Secondary | ICD-10-CM

## 2019-01-23 DIAGNOSIS — Z23 Encounter for immunization: Secondary | ICD-10-CM

## 2019-01-23 DIAGNOSIS — D500 Iron deficiency anemia secondary to blood loss (chronic): Secondary | ICD-10-CM | POA: Diagnosis present

## 2019-01-23 DIAGNOSIS — Z20828 Contact with and (suspected) exposure to other viral communicable diseases: Secondary | ICD-10-CM | POA: Diagnosis present

## 2019-01-23 DIAGNOSIS — R7303 Prediabetes: Secondary | ICD-10-CM | POA: Diagnosis present

## 2019-01-23 DIAGNOSIS — E860 Dehydration: Secondary | ICD-10-CM | POA: Diagnosis not present

## 2019-01-23 DIAGNOSIS — R531 Weakness: Secondary | ICD-10-CM | POA: Diagnosis not present

## 2019-01-23 DIAGNOSIS — F17210 Nicotine dependence, cigarettes, uncomplicated: Secondary | ICD-10-CM | POA: Diagnosis present

## 2019-01-23 DIAGNOSIS — I499 Cardiac arrhythmia, unspecified: Secondary | ICD-10-CM | POA: Diagnosis not present

## 2019-01-23 LAB — CBC WITH DIFFERENTIAL/PLATELET
Abs Immature Granulocytes: 0.09 10*3/uL — ABNORMAL HIGH (ref 0.00–0.07)
Basophils Absolute: 0.1 10*3/uL (ref 0.0–0.1)
Basophils Relative: 1 %
Eosinophils Absolute: 0.3 10*3/uL (ref 0.0–0.5)
Eosinophils Relative: 2 %
HCT: 37.5 % — ABNORMAL LOW (ref 39.0–52.0)
Hemoglobin: 12.2 g/dL — ABNORMAL LOW (ref 13.0–17.0)
Immature Granulocytes: 1 %
Lymphocytes Relative: 28 %
Lymphs Abs: 3 10*3/uL (ref 0.7–4.0)
MCH: 29.7 pg (ref 26.0–34.0)
MCHC: 32.5 g/dL (ref 30.0–36.0)
MCV: 91.2 fL (ref 80.0–100.0)
Monocytes Absolute: 0.9 10*3/uL (ref 0.1–1.0)
Monocytes Relative: 8 %
Neutro Abs: 6.2 10*3/uL (ref 1.7–7.7)
Neutrophils Relative %: 60 %
Platelets: 396 10*3/uL (ref 150–400)
RBC: 4.11 MIL/uL — ABNORMAL LOW (ref 4.22–5.81)
RDW: 13.9 % (ref 11.5–15.5)
WBC: 10.4 10*3/uL (ref 4.0–10.5)
nRBC: 0 % (ref 0.0–0.2)

## 2019-01-23 LAB — URINALYSIS, ROUTINE W REFLEX MICROSCOPIC
Bilirubin Urine: NEGATIVE
Glucose, UA: NEGATIVE mg/dL
Hgb urine dipstick: NEGATIVE
Ketones, ur: NEGATIVE mg/dL
Nitrite: NEGATIVE
Protein, ur: NEGATIVE mg/dL
Specific Gravity, Urine: 1.018 (ref 1.005–1.030)
pH: 5 (ref 5.0–8.0)

## 2019-01-23 LAB — COMPREHENSIVE METABOLIC PANEL
ALT: 21 U/L (ref 0–44)
AST: 19 U/L (ref 15–41)
Albumin: 2.5 g/dL — ABNORMAL LOW (ref 3.5–5.0)
Alkaline Phosphatase: 56 U/L (ref 38–126)
Anion gap: 10 (ref 5–15)
BUN: 11 mg/dL (ref 8–23)
CO2: 22 mmol/L (ref 22–32)
Calcium: 8.5 mg/dL — ABNORMAL LOW (ref 8.9–10.3)
Chloride: 105 mmol/L (ref 98–111)
Creatinine, Ser: 1.08 mg/dL (ref 0.61–1.24)
GFR calc Af Amer: >60 mL/min (ref >60)
GFR calc non Af Amer: >60 mL/min (ref >60)
Glucose, Bld: 126 mg/dL — ABNORMAL HIGH (ref 70–99)
Potassium: 3.4 mmol/L — ABNORMAL LOW (ref 3.5–5.1)
Sodium: 137 mmol/L (ref 135–145)
Total Bilirubin: 0.4 mg/dL (ref 0.3–1.2)
Total Protein: 6.4 g/dL — ABNORMAL LOW (ref 6.5–8.1)

## 2019-01-23 LAB — RAPID URINE DRUG SCREEN, HOSP PERFORMED
Amphetamines: NOT DETECTED
Barbiturates: NOT DETECTED
Benzodiazepines: NOT DETECTED
Cocaine: NOT DETECTED
Opiates: NOT DETECTED
Tetrahydrocannabinol: NOT DETECTED

## 2019-01-23 LAB — POC OCCULT BLOOD, ED: Fecal Occult Bld: POSITIVE — AB

## 2019-01-23 LAB — MAGNESIUM: Magnesium: 2 mg/dL (ref 1.7–2.4)

## 2019-01-23 LAB — HEMOGLOBIN A1C
Hgb A1c MFr Bld: 6.4 % — ABNORMAL HIGH (ref 4.8–5.6)
Mean Plasma Glucose: 136.98 mg/dL

## 2019-01-23 LAB — CREATININE, SERUM
Creatinine, Ser: 1.02 mg/dL (ref 0.61–1.24)
GFR calc Af Amer: >60 mL/min (ref >60)
GFR calc non Af Amer: >60 mL/min (ref >60)

## 2019-01-23 LAB — SARS CORONAVIRUS 2 (TAT 6-24 HRS): SARS Coronavirus 2: NEGATIVE

## 2019-01-23 LAB — TSH: TSH: 0.794 u[IU]/mL (ref 0.350–4.500)

## 2019-01-23 MED ORDER — ENOXAPARIN SODIUM 40 MG/0.4ML ~~LOC~~ SOLN
40.0000 mg | SUBCUTANEOUS | Status: DC
  Administered 2019-01-23 – 2019-01-25 (×3): 40 mg via SUBCUTANEOUS
  Filled 2019-01-23 (×3): qty 0.4

## 2019-01-23 MED ORDER — POTASSIUM CHLORIDE CRYS ER 20 MEQ PO TBCR
40.0000 meq | EXTENDED_RELEASE_TABLET | Freq: Once | ORAL | Status: AC
  Administered 2019-01-23: 40 meq via ORAL
  Filled 2019-01-23: qty 2

## 2019-01-23 MED ORDER — SODIUM CHLORIDE 0.9 % IV BOLUS
500.0000 mL | Freq: Once | INTRAVENOUS | Status: AC
  Administered 2019-01-23: 500 mL via INTRAVENOUS

## 2019-01-23 MED ORDER — POTASSIUM CHLORIDE CRYS ER 20 MEQ PO TBCR
40.0000 meq | EXTENDED_RELEASE_TABLET | Freq: Once | ORAL | Status: AC
  Administered 2019-01-23: 09:00:00 40 meq via ORAL
  Filled 2019-01-23: qty 2

## 2019-01-23 MED ORDER — SODIUM CHLORIDE 0.9 % IV BOLUS: 1000.0000 mL | Freq: Once | INTRAVENOUS | Status: DC

## 2019-01-23 NOTE — ED Notes (Signed)
Lunch Tray Ordered @ 1009. 

## 2019-01-23 NOTE — H&P (Addendum)
Date: 01/23/2019               Patient Name:  Todd Ferguson MRN: [id number]  DOB: [birth year] Age / Sex: 78 y.o., male   PCP: Antony Blackbird, MD         Medical Service: Internal Medicine Teaching Service         Attending Physician: Dr. Evette Doffing, Mallie Mussel, *    First Contact: Dr. Dawayne Cirri Pager: [phone]  Second Contact: Dr. Georgia Lopes Pager: [phone]       After Hours (After 5p/  First Contact Pager: [phone]  weekends / holidays): Second Contact Pager: [phone]   Chief Complaint: Sweating  History of Present Illness: Mr. Selmer is a 78 year old male with past medical history of hypertension, gout, prediabetes, hypertriglyceridemia who presents for a 4-day history of diaphoresis.  Patient also reports subjective fevers (did not measure), feeling light-headed, and dark/malodorous urine over the past 4 days.  Patient endorses nausea but denies vomiting and reports decreased oral intake over the past few days.  Denies weight loss. Patient had one episode of dark-colored stools 4 days ago but denies prior or subsequent episodes or bright red blood in stools or urine, denies changes in stool calliber.  Patient denies headaches, chest pain, shortness of breath, cough, abdominal pain, dysuria, urinary frequency/urgency.  Patient reports only occasional alcohol usage, denies all recreational drug usage other than marijuana.  Meds:  Amlodipine 10 mg QD Lisinopril 40 mg QD Aspirin 81 mg QD Colchicine     Allergies: * Denies allergies to medications  Past Medical History:  Diagnosis Date   Bradycardia    Cataract    had one on his right eye, per pt they removed his right eye because of the cateract.   Hypertension    Tobacco abuse     Family History:  * Mother died from diabetic complications * Denies family history of malignancy  Social History:  * Occasional alcohol usage, denies current or prior history of heavy alcohol usage * 1/2 PPD smoking since age 58 *  Denies recreational drug use other than marijuana * Lives at home with wife. Now retired. Worked most recently in furniture business  Review of Systems: A complete ROS was negative except as per HPI.   Physical Exam: Blood pressure (!) 104/45, pulse (!) 36, temperature 98.3 F (36.8 C), temperature source Oral, resp. rate (!) 22, height 5\' 4"  (1.626 m), weight 88.9 kg, SpO2 97 %. Physical Exam  Constitutional: He is well-developed, well-nourished, and in no distress.  HENT:  Head: Normocephalic and atraumatic.  Eyes: EOM are normal. Left eye exhibits no discharge.  Right eye absent from prior surgery  Neck: Normal range of motion. Neck supple. No tracheal deviation present.  Cardiovascular: Normal rate and regular rhythm. Exam reveals no gallop and no friction rub.  No murmur heard. Pulmonary/Chest: Effort normal and breath sounds normal. No respiratory distress. He has no wheezes. He has no rales.  Abdominal: Soft. He exhibits no distension. There is no abdominal tenderness. There is no rebound and no guarding.  Genitourinary: Rectum:     Guaiac result positive.   Musculoskeletal: Normal range of motion.        General: No tenderness, deformity or edema.  Lymphadenopathy:    He has no cervical adenopathy.  Neurological: He is alert. Coordination normal.  Skin: Skin is warm and dry. No rash noted. He is not diaphoretic. No erythema.  Psychiatric: Memory, affect and judgment normal.  EKG: personally reviewed my interpretation is sinus bradycardia  CXR: personally reviewed my interpretation is no evidence of acute cardiopulmonary disease  Assessment & Plan by Problem:  Principal Problem:   Sinus bradycardia Active Problems:   Essential hypertension, benign   Mr. Wilner is a 78 year old male who presents with a 4-day history of diaphoresis and found to hypotensive (now resolved) and bradycardic.  # Asymptomatic bradycardia: Bradycardic to 36 and hypotensive to 83/45 in  ER. On followup reading, BP of  123/63. No other specific symptoms other than nausea. No bradycardic medications. Patient without clear symptoms or physical exam signs of heart failure. Last Echo from October 2016 with EF 55-60% and mild concentric hypertrophy. No clear infectious etiology - UA with moderate leukocytes, no urinary symptoms. Will obtain urine culture. Without respiratory symptoms and CXR without infiltrate. * 500 ml NS fluid bolus * Will repeat echo for structural abnormalities.  * Monitor on telemetry * TSH * Repeat EKG in morning * Will consider cardiology consultation pending current evaluation  # Blood in stool: Stool guaiac positive in ER and hx of dark stools x1 episode. Patient now normotensive, hemoglobin of 12.2 - only mildly decreased from baseline of 13.4 three days ago. On colonoscopy May 2017, patient with 4 sessile polyps in sigmoid and ascending colon for which followup would be recommended 3-5 years ago. Given location of polyps in ascending colon may have developed right-sided malignancy. Without significant abdominal pain, but also considering peptic-ulcer disease. * Will repeat CBC in AM and if continue to down-trend will consider GI consult * If stable, recommend colonoscopy as outpatient  # Hypoalbuminemia: Albumin of 2.5. Creatinine WNL and AST/ALT WNL without hx of liver disease. May be 2/2 nutrition.  # HTN: On Amlodipine 10 mg QD and Lisinopril 40 mg QD. Will hold for now # Gout: Patient endorsed taking colchicine daily but does not appear to have active script # Pre-diabetes: Will recheck A1C, last of 5.9 in October 2019, not on medication    Dispo: Admit patient to Inpatient with expected length of stay greater than 2 midnights.  Signed: Jeanmarie Hubert, MD 01/23/2019, 7:56 AM  Pager: [phone]

## 2019-01-23 NOTE — ED Triage Notes (Addendum)
  Patient BIB EMS for weakness related to UTI-like symptoms.  Patient states he was seen a few days ago for dehydration and was told it was affecting his kidney function.  Patient states his urine is still dark and odorous.  Patient was told to stop taking his HCTZ medication as well.  Patient is A&O x4.  Afebrile. No pain at this time.  Patient HR 38-45 bpm

## 2019-01-23 NOTE — ED Provider Notes (Signed)
[facility] EMERGENCY DEPARTMENT Provider Note   CSN: [id number] Arrival date & time: 01/23/19  0443    History   Chief Complaint Chief Complaint  Patient presents with  . Urinary Tract Infection  . Weakness    HPI Todd Ferguson is a 78 y.o. male.   The history is provided by the patient and the EMS personnel.  Urinary Tract Infection Weakness He has history of hypertension, gout, pre-diabetes, hypertriglyceridemia and comes in because of weakness.  He was seen in the ED 3 days ago and found to have elevated creatinine and hydrochlorothiazide was discontinued.  He was told that he might have a urinary tract infection, but was not given any antibiotics.  Since discharge from the ED, his urine continues to be dark and malodorous.  Today, he was breaking out in sweats but denies fever or chills.  He denies cough.  He denies dyspnea.  Denies nausea, vomiting, diarrhea.  He denies any dysuria.  There has been no known exposure to COVID-19.  EMS did note bradycardia while coming to the ED.  Past Medical History:  Diagnosis Date  . Bradycardia   . 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  . Hypertension   . Tobacco abuse     Patient Active Problem List   Diagnosis Date Noted  . Hospital discharge follow-up 09/17/2016  . Chest pain 07/17/2016  . Swelling of left hand 07/17/2016  . Healthcare maintenance 02/07/2016  . Gout involving toe of right foot 07/25/2015  . 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 Smoking 08/07/2014  . Prostate cancer screening 08/07/2014  . Colon cancer screening 08/07/2014  . Prediabetes 08/07/2014  . Special screening for malignant neoplasms, colon 01/19/2014  . Encounter for abdominal aortic aneurysm screening 01/19/2014  . Essential hypertension, benign 07/11/2013  . Hypertriglyceridemia 07/11/2013  . Unspecified vitamin D deficiency 07/11/2013    Past  Surgical History:  Procedure Laterality Date  . APPENDECTOMY    . DENTAL SURGERY    . Removed right eye          Home Medications    Prior to Admission medications   Medication Sig Start Date End Date Taking? Authorizing Provider  amLODipine (NORVASC) 10 MG tablet Take 1 tablet (10 mg total) by mouth daily. To lower blood pressure 03/04/18   Fulp, Cammie, MD  amLODipine (NORVASC) 10 MG tablet Take 1 tablet (10 mg total) by mouth daily. 12/31/18   Peri Jefferson, PA-C  aspirin EC 81 MG tablet Take 1 tablet (81 mg total) by mouth daily. 03/04/18   Fulp, Cammie, MD  cephALEXin (KEFLEX) 500 MG capsule Take 1 capsule (500 mg total) by mouth 3 (three) times daily. Patient not taking: Reported on 03/04/2018 11/08/17   Horton, Barbette Hair, MD  Colchicine 0.6 MG CAPS Take 2 tablets now Patient not taking: Reported on 07/09/2017 03/18/17   Tresa Garter, MD  ibuprofen (ADVIL,MOTRIN) 800 MG tablet Take 1 tablet (800 mg total) by mouth every 8 (eight) hours as needed. Patient not taking: Reported on 03/04/2018 03/18/17   Tresa Garter, MD  lisinopril (PRINIVIL,ZESTRIL) 40 MG tablet Take 1 tablet (40 mg total) by mouth daily. 03/04/18   Fulp, Cammie, MD  lisinopril (ZESTRIL) 40 MG tablet Take 1 tablet (40 mg total) by mouth daily. 12/31/18   Peri Jefferson, PA-C  hydrochlorothiazide (HYDRODIURIL) 25 MG tablet Take 1 tablet (  25 mg total) by mouth daily. 12/31/18 01/20/19  Peri Jefferson, PA-C    Family History Family History  Problem Relation Age of Onset  . Diabetes Brother   . Heart disease Mother   . Kidney disease Mother   . Colon cancer Neg Hx   . Esophageal cancer Neg Hx   . Rectal cancer Neg Hx   . Stomach cancer Neg Hx   . Prostate cancer Neg Hx   . Pancreatic cancer Neg Hx     Social History Social History   Tobacco Use  . Smoking status: Current Every Day Smoker    Packs/day: 0.25    Types: Cigarettes  . Smokeless tobacco: Never Used  . Tobacco comment:  occassionally   Substance Use Topics  . Alcohol use: Yes    Comment: socially  . Drug use: Yes    Types: Marijuana    Comment: Marijuana about 1 week ago.     Allergies   Patient has no known allergies.   Review of Systems Review of Systems  Neurological: Positive for weakness.  All other systems reviewed and are negative.    Physical Exam Updated Vital Signs BP (!) 136/58 (BP Location: Left Arm)   Pulse (!) 43   Temp 98.3 F (36.8 C) (Oral)   Resp 18   Ht 5\' 4"  (1.626 m)   Wt 88.9 kg   SpO2 97%   BMI 33.64 kg/m   Physical Exam Vitals signs and nursing note reviewed.    78 year old male, resting comfortably and in no acute distress. Vital signs are significant for slow heart rate. Oxygen saturation is 97%, which is normal. Head is normocephalic and atraumatic. PERRLA, EOMI. Oropharynx is clear. Neck is nontender and supple without adenopathy or JVD. Back is nontender and there is no CVA tenderness. Lungs are clear without rales, wheezes, or rhonchi. Chest is nontender. Heart is bradycardic without murmur. Abdomen is soft, flat, nontender without masses or hepatosplenomegaly and peristalsis is normoactive. Extremities have no cyanosis or edema, full range of motion is present. Skin is warm and dry without rash. Neurologic: Mental status is normal, cranial nerves are intact, there are no motor or sensory deficits.  ED Treatments / Results  Labs (all labs ordered are listed, but only abnormal results are displayed) Labs Reviewed  COMPREHENSIVE METABOLIC PANEL - Abnormal; Notable for the following components:      Result Value   Potassium 3.4 (*)    Glucose, Bld 126 (*)    Calcium 8.5 (*)    Total Protein 6.4 (*)    Albumin 2.5 (*)    All other components within normal limits  CBC WITH DIFFERENTIAL/PLATELET - Abnormal; Notable for the following components:   RBC 4.11 (*)    Hemoglobin 12.2 (*)    HCT 37.5 (*)    Abs Immature Granulocytes 0.09 (*)    All  other components within normal limits  URINALYSIS, ROUTINE W REFLEX MICROSCOPIC - Abnormal; Notable for the following components:   Leukocytes,Ua MODERATE (*)    Bacteria, UA RARE (*)    All other components within normal limits  POC OCCULT BLOOD, ED - Abnormal; Notable for the following components:   Fecal Occult Bld POSITIVE (*)    All other components within normal limits  MAGNESIUM    EKG EKG Interpretation  Date/Time:  Sunday January 23 2019 04:45:11 EDT Ventricular Rate:  52 PR Interval:    QRS Duration: 94 QT Interval:  420 QTC Calculation: 391 R Axis:  60 Text Interpretation:  Sinus bradycardia Otherwise within normal limits When compared with ECG of  07/17/2016, HEART RATE has decreased Premature ventricular complexes are no longer present Confirmed by Delora Fuel ([id number]) on 01/23/2019 4:48:07 AM   Radiology Dg Chest Port 1 View  Result Date: 01/23/2019 CLINICAL DATA:  Initial evaluation for acute weakness. EXAM: PORTABLE CHEST 1 VIEW COMPARISON:  Prior radiograph from 06/10/2017. FINDINGS: Transverse heart size at the upper limits of normal. Mediastinal silhouette within normal limits. Aortic atherosclerosis. Lungs normally inflated. No focal infiltrates. No edema or effusion. No pneumothorax. No acute osseous finding. IMPRESSION: 1. No active cardiopulmonary disease. 2. Aortic atherosclerosis. Electronically Signed   By: Jeannine Boga M.D.   On: 01/23/2019 06:00    Procedures Procedures   Medications Ordered in ED Medications  potassium chloride SA (K-DUR) CR tablet 40 mEq (has no administration in time range)     Initial Impression / Assessment and Plan / ED Course  I have reviewed the triage vital signs and the nursing notes.  Pertinent labs & imaging results that were available during my care of the patient were reviewed by me and considered in my medical decision making (see chart for details).  Weakness with slow heart rate.  Old records are reviewed  confirming ED visit 3 days ago with elevated creatinine but no evidence of UTI, recorded heart rates were all in the 60s.  He is not on any beta-blockers but is on a calcium channel blocker.  ECG is significant for sinus bradycardia.  Will check screening labs and chest x-ray.  Labs show borderline hypokalemia, normal renal function, no evidence of urinary tract infection.  Hemoglobin is 12.2, which is a 1 g drop compared with 3 days ago.  Rectal examination was done and stool was noted to be Hemoccult positive.  Also, while patient was sleeping, heart rate dropped down into the 30s with blood pressure dropping down to 80 systolic.  With these findings, it is felt he should be admitted.  Case is discussed with Dr. Berline Lopes of internal medicine teaching service, who agrees to admit the patient.  Final Clinical Impressions(s) / ED Diagnoses   Final diagnoses:  Weakness  Sinus bradycardia  Anemia due to blood loss  Guaiac positive stools  Hypokalemia    ED Discharge Orders    None       Delora Fuel, MD [id number] [phone]

## 2019-01-23 NOTE — ED Notes (Signed)
ED TO INPATIENT HANDOFF REPORT  ED Nurse Name and Phone #: Kathlee Nations [phone]  S Name/Age/Gender Todd Ferguson 78 y.o. male Room/Bed: 028C/028C  Code Status   Code Status: Full Code  Home/SNF/Other Home Patient oriented to: self, place, time and situation Is this baseline? Yes   Triage Complete: Triage complete  Chief Complaint Weakness  Triage Note  Patient BIB EMS for weakness related to UTI-like symptoms.  Patient states he was seen a few days ago for dehydration and was told it was affecting his kidney function.  Patient states his urine is still dark and odorous.  Patient was told to stop taking his HCTZ medication as well.  Patient is A&O x4.  Afebrile. No pain at this time.  Patient HR 38-45 bpm   Allergies No Known Allergies  Level of Care/Admitting Diagnosis ED Disposition    ED Disposition Condition Comment   Admit  Hospital Area: [facility] [[id number]]  Level of Care: Telemetry Cardiac [103]  Covid Evaluation: Asymptomatic Screening Protocol (No Symptoms)  Diagnosis: Hypotension [id number]  Admitting Physician: Axel Filler [id number]  Attending Physician: Axel Filler [id number]  Estimated length of stay: past midnight tomorrow  Certification:: I certify this patient will need inpatient services for at least 2 midnights  PT Class (Do Not Modify): Inpatient [101]  PT Acc Code (Do Not Modify): Private [1]       B Medical/Surgery History Past Medical History:  Diagnosis Date  . Bradycardia   . 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  . Hypertension   . Tobacco abuse    Past Surgical History:  Procedure Laterality Date  . APPENDECTOMY    . DENTAL SURGERY    . Removed right eye       A IV Location/Drains/Wounds Patient Lines/Drains/Airways Status   Active Line/Drains/Airways    Name:   Placement date:   Placement time:   Site:   Days:   Peripheral IV  11/08/17 Left Antecubital   11/08/17    0510    Antecubital   441   Peripheral IV 01/23/19 Anterior;Right Wrist   01/23/19    0444    Wrist   less than 1          Intake/Output Last 24 hours  Intake/Output Summary (Last 24 hours) at 01/23/2019 1033 Last data filed at 01/23/2019 0933 Gross per 24 hour  Intake 500 ml  Output -  Net 500 ml    Labs/Imaging Results for orders placed or performed during the hospital encounter of 01/23/19 (from the past 48 hour(s))  Comprehensive metabolic panel     Status: Abnormal   Collection Time: 01/23/19  5:08 AM  Result Value Ref Range   Sodium 137 135 - 145 mmol/L   Potassium 3.4 (L) 3.5 - 5.1 mmol/L   Chloride 105 98 - 111 mmol/L   CO2 22 22 - 32 mmol/L   Glucose, Bld 126 (H) 70 - 99 mg/dL   BUN 11 8 - 23 mg/dL   Creatinine, Ser 1.08 0.61 - 1.24 mg/dL   Calcium 8.5 (L) 8.9 - 10.3 mg/dL   Total Protein 6.4 (L) 6.5 - 8.1 g/dL   Albumin 2.5 (L) 3.5 - 5.0 g/dL   AST 19 15 - 41 U/L   ALT 21 0 - 44 U/L   Alkaline Phosphatase 56 38 - 126 U/L   Total Bilirubin 0.4 0.3 - 1.2 mg/dL  GFR calc non Af Amer >60 >60 mL/min   GFR calc Af Amer >60 >60 mL/min   Anion gap 10 5 - 15    Comment: Performed at [facility] [address], [place], [place] [address]  CBC with Differential     Status: Abnormal   Collection Time: 01/23/19  5:08 AM  Result Value Ref Range   WBC 10.4 4.0 - 10.5 K/uL   RBC 4.11 (L) 4.22 - 5.81 MIL/uL   Hemoglobin 12.2 (L) 13.0 - 17.0 g/dL   HCT 37.5 (L) 39.0 - 52.0 %   MCV 91.2 80.0 - 100.0 fL   MCH 29.7 26.0 - 34.0 pg   MCHC 32.5 30.0 - 36.0 g/dL   RDW 13.9 11.5 - 15.5 %   Platelets 396 150 - 400 K/uL   nRBC 0.0 0.0 - 0.2 %   Neutrophils Relative % 60 %   Neutro Abs 6.2 1.7 - 7.7 K/uL   Lymphocytes Relative 28 %   Lymphs Abs 3.0 0.7 - 4.0 K/uL   Monocytes Relative 8 %   Monocytes Absolute 0.9 0.1 - 1.0 K/uL   Eosinophils Relative 2 %   Eosinophils Absolute 0.3 0.0 - 0.5 K/uL   Basophils Relative 1 %    Basophils Absolute 0.1 0.0 - 0.1 K/uL   Immature Granulocytes 1 %   Abs Immature Granulocytes 0.09 (H) 0.00 - 0.07 K/uL    Comment: Performed at [facility] [address], [place], [place] [address]  Magnesium     Status: None   Collection Time: 01/23/19  5:08 AM  Result Value Ref Range   Magnesium 2.0 1.7 - 2.4 mg/dL    Comment: Performed at [facility] [address] [place], [place] [address]  Urinalysis, Routine w reflex microscopic     Status: Abnormal   Collection Time: 01/23/19  5:41 AM  Result Value Ref Range   Color, Urine YELLOW YELLOW   APPearance CLEAR CLEAR   Specific Gravity, Urine 1.018 1.005 - 1.030   pH 5.0 5.0 - 8.0   Glucose, UA NEGATIVE NEGATIVE mg/dL   Hgb urine dipstick NEGATIVE NEGATIVE   Bilirubin Urine NEGATIVE NEGATIVE   Ketones, ur NEGATIVE NEGATIVE mg/dL   Protein, ur NEGATIVE NEGATIVE mg/dL   Nitrite NEGATIVE NEGATIVE   Leukocytes,Ua MODERATE (A) NEGATIVE   WBC, UA 0-5 0 - 5 WBC/hpf   Bacteria, UA RARE (A) NONE SEEN   Mucus PRESENT    Hyaline Casts, UA PRESENT     Comment: Performed at [facility] [address], [place], [place] [address]  POC occult blood, ED Provider will collect     Status: Abnormal   Collection Time: 01/23/19  6:54 AM  Result Value Ref Range   Fecal Occult Bld POSITIVE (A) NEGATIVE  Creatinine, serum     Status: None   Collection Time: 01/23/19  8:32 AM  Result Value Ref Range   Creatinine, Ser 1.02 0.61 - 1.24 mg/dL   GFR calc non Af Amer >60 >60 mL/min   GFR calc Af Amer >60 >60 mL/min    Comment: Performed at [facility] [address], [place], [place] [address]  TSH     Status: None   Collection Time: 01/23/19  8:32 AM  Result Value Ref Range   TSH 0.794 0.350 - 4.500 uIU/mL    Comment: Performed by a 3rd Generation assay with a functional sensitivity of <=0.01 uIU/mL. Performed at [facility]  [facility], [address], [place], [place] [address]   Hemoglobin A1c     Status: Abnormal    Collection Time: 01/23/19  8:32 AM  Result Value Ref Range   Hgb A1c MFr Bld 6.4 (H) 4.8 - 5.6 %    Comment: (NOTE) Pre diabetes:          5.7%-6.4% Diabetes:              >6.4% Glycemic control for   <7.0% adults with diabetes    Mean Plasma Glucose 136.98 mg/dL    Comment: Performed at [facility] [address] [place], [place] [address]   Dg Chest Port 1 View  Result Date: 01/23/2019 CLINICAL DATA:  Initial evaluation for acute weakness. EXAM: PORTABLE CHEST 1 VIEW COMPARISON:  Prior radiograph from 06/10/2017. FINDINGS: Transverse heart size at the upper limits of normal. Mediastinal silhouette within normal limits. Aortic atherosclerosis. Lungs normally inflated. No focal infiltrates. No edema or effusion. No pneumothorax. No acute osseous finding. IMPRESSION: 1. No active cardiopulmonary disease. 2. Aortic atherosclerosis. Electronically Signed   By: Jeannine Boga M.D.   On: 01/23/2019 06:00    Pending Labs Unresulted Labs (From admission, onward)    Start     Ordered   01/24/19 0500  Comprehensive metabolic panel  Tomorrow morning,   R     01/23/19 0737   01/24/19 0500  CBC  Tomorrow morning,   R     01/23/19 0737   01/24/19 0500  Protime-INR  Tomorrow morning,   R     01/23/19 0737   01/24/19 0500  APTT  Tomorrow morning,   R     01/23/19 0737   01/23/19 0808  Urine rapid drug screen (hosp performed)  Add-on,   AD     01/23/19 0807   01/23/19 0737  Urine culture  Add-on,   AD     01/23/19 0737   01/23/19 0728  SARS CORONAVIRUS 2 (TAT 6-24 HRS) Nasopharyngeal Nasopharyngeal Swab  (Asymptomatic/Tier 2)  Once,   STAT    Question Answer Comment  Is this test for diagnosis or screening Screening   Symptomatic for COVID-19 as defined by CDC No   Hospitalized for COVID-19 No   Admitted to ICU for COVID-19 No   Previously tested for COVID-19 No   Resident in a congregate (group) care setting No   Employed in healthcare setting No      01/23/19 0728           Vitals/Pain Today's Vitals   01/23/19 0845 01/23/19 0900 01/23/19 0915 01/23/19 0930  BP: 133/62 (!) 121/52 (!) 109/57 (!) 107/53  Pulse: (!) 47 (!) 35 (!) 42 (!) 40  Resp: (!) 22 (!) 22 (!) 22 (!) 21  Temp:      TempSrc:      SpO2: 97% 94% 94% 94%  Weight:      Height:      PainSc:        Isolation Precautions No active isolations  Medications Medications  enoxaparin (LOVENOX) injection 40 mg (has no administration in time range)  potassium chloride SA (K-DUR) CR tablet 40 mEq (has no administration in time range)  potassium chloride SA (K-DUR) CR tablet 40 mEq (40 mEq Oral Given 01/23/19 0830)  sodium chloride 0.9 % bolus 500 mL (0 mLs Intravenous Stopped 01/23/19 0933)    Mobility walks with device Low fall risk   Focused Assessments Observation   R Recommendations: See Admitting Provider Note  Report given to:   Additional Notes:

## 2019-01-24 ENCOUNTER — Inpatient Hospital Stay (HOSPITAL_COMMUNITY): Payer: Medicare Other

## 2019-01-24 DIAGNOSIS — R195 Other fecal abnormalities: Secondary | ICD-10-CM

## 2019-01-24 DIAGNOSIS — I959 Hypotension, unspecified: Secondary | ICD-10-CM

## 2019-01-24 DIAGNOSIS — R001 Bradycardia, unspecified: Secondary | ICD-10-CM

## 2019-01-24 DIAGNOSIS — Z86010 Personal history of colonic polyps: Secondary | ICD-10-CM

## 2019-01-24 LAB — COMPREHENSIVE METABOLIC PANEL
ALT: 20 U/L (ref 0–44)
AST: 19 U/L (ref 15–41)
Albumin: 2.5 g/dL — ABNORMAL LOW (ref 3.5–5.0)
Alkaline Phosphatase: 54 U/L (ref 38–126)
Anion gap: 7 (ref 5–15)
BUN: 13 mg/dL (ref 8–23)
CO2: 23 mmol/L (ref 22–32)
Calcium: 8.6 mg/dL — ABNORMAL LOW (ref 8.9–10.3)
Chloride: 107 mmol/L (ref 98–111)
Creatinine, Ser: 1.06 mg/dL (ref 0.61–1.24)
GFR calc Af Amer: >60 mL/min (ref >60)
GFR calc non Af Amer: >60 mL/min (ref >60)
Glucose, Bld: 107 mg/dL — ABNORMAL HIGH (ref 70–99)
Potassium: 4.8 mmol/L (ref 3.5–5.1)
Sodium: 137 mmol/L (ref 135–145)
Total Bilirubin: 0.4 mg/dL (ref 0.3–1.2)
Total Protein: 6.4 g/dL — ABNORMAL LOW (ref 6.5–8.1)

## 2019-01-24 LAB — CBC
HCT: 36.6 % — ABNORMAL LOW (ref 39.0–52.0)
Hemoglobin: 12 g/dL — ABNORMAL LOW (ref 13.0–17.0)
MCH: 29.7 pg (ref 26.0–34.0)
MCHC: 32.8 g/dL (ref 30.0–36.0)
MCV: 90.6 fL (ref 80.0–100.0)
Platelets: 411 10*3/uL — ABNORMAL HIGH (ref 150–400)
RBC: 4.04 MIL/uL — ABNORMAL LOW (ref 4.22–5.81)
RDW: 14.1 % (ref 11.5–15.5)
WBC: 12.8 10*3/uL — ABNORMAL HIGH (ref 4.0–10.5)
nRBC: 0 % (ref 0.0–0.2)

## 2019-01-24 LAB — APTT: aPTT: 30 seconds (ref 24–36)

## 2019-01-24 LAB — URINE CULTURE: Culture: NO GROWTH

## 2019-01-24 LAB — PROTIME-INR
INR: 1.2 (ref 0.8–1.2)
Prothrombin Time: 14.7 seconds (ref 11.4–15.2)

## 2019-01-24 LAB — ECHOCARDIOGRAM COMPLETE
Height: 64 in
Weight: 3136 oz

## 2019-01-24 NOTE — Progress Notes (Signed)
  Echocardiogram 2D Echocardiogram has been performed.  Todd Ferguson 01/24/2019, 1:39 PM

## 2019-01-24 NOTE — Progress Notes (Addendum)
   Subjective: Pt seen at the bedside on rounds today. Pt sitting up at the side of the bed and eating breakfast. Appears to have a good appetite. Pt denies chest pain or SOB but did feel cold overnight, but doesn't feel that way this morning. Does not feel sweaty today. No complaints today.   I called and spoke with the patient spouse with his permission. She stated that for the past week he has been complaining of profuse nightly diaphoresis, chills, crusting in his chest with discomfort at times, swelling in his feet and hands, and moments of incoherence associated with diaphoresis.  In addition he been lying around throughout the day, not engaging in his usual activities essentially not behaving like himself.  Objective:  Vital signs in last 24 hours: Vitals:   01/23/19 2141 01/24/19 0623 01/24/19 0954 01/24/19 1348  BP: (!) 115/59 115/64 122/61 (!) 133/59  Pulse: (!) 42 (!) 51  (!) 48  Resp: 18 18    Temp: 99.3 F (37.4 C) 99.3 F (37.4 C)  98.6 F (37 C)  TempSrc: Oral Oral  Oral  SpO2: 99% 98%  98%  Weight:      Height:       General: A/O x4, in no acute distress, afebrile, nondiaphoretic Cardio: Sinus bradycardia, no mrg's  Pulmonary: CTA bilaterally, no wheezing or crackles  MSK: BLE nontender, nonedematous Psych: Appropriate affect, not depressed in appearance, engages well  Assessment/Plan:  Principal Problem:   Sinus bradycardia Active Problems:   Essential hypertension, benign  Mr. Gloriann Loan is a 78 year old male who presented with an 8-day history of diaphoresis, chills, fatigue, and daytime somnolence.  He was found to be hypotensive and bradycardic on presentation.  His bradycardia persisted with a low of 36 which accompanied his fatigue.  With activity his heart rate has improved to the 70s and by day 2 he was no longer concerned with diaphoresis fatigue.  A/P: Symptomatic bradycardia: Junctional escape rhythm: Given the patient's 1 week history of diaphoresis,  chills, fatigue and generalized malaise without signs of infection or other etiology there is concern that his bradycardia with junctional rhythm may be at fault.  We have ordered an echocardiogram to assist in evaluating for structural heart abnormalities.  Thus far evaluation has been nearly unremarkable and his symptoms have improved.  We will discontinue the patient's amlodipine given the potential for this to cause decreased heart rate and hypotension.  I am hopeful that discontinuation of his amlodipine will improve his heart rate and will observe for this overnight.  His UDS screen was negative, A1c is stable at 6.4%, TSH was normal, urinalysis was negative. - Follow-up echocardiogram -Continue to continue telemetry monitoring overnight -We will repeat EKG in the morning again - Continue to consider cardiology consultation pending current evaluation  Positive FOBT:  Patient's hemoglobin is stable today at 12.0.  He denies abdominal pain, bright red blood per rectum, or melena.  I do not feel that his mild anemia is a cause for his bradycardia and fatigue.  However, given his history of colonic polyps from a.m. colonoscopy in May 2017 we do recommend a follow-up in 3 to 5 years which can be completed as an outpatient if GI agrees this is appropriate.  No further evaluation indicated at this time  Dispo: Anticipated discharge in approximately 1-2 day(s).   Kathi Ludwig, MD 01/24/2019, 2:01 PM Pager: # [phone]

## 2019-01-24 NOTE — Plan of Care (Signed)

## 2019-01-25 DIAGNOSIS — I10 Essential (primary) hypertension: Secondary | ICD-10-CM

## 2019-01-25 DIAGNOSIS — I495 Sick sinus syndrome: Principal | ICD-10-CM

## 2019-01-25 DIAGNOSIS — R001 Bradycardia, unspecified: Secondary | ICD-10-CM

## 2019-01-25 LAB — CBC
HCT: 34.6 % — ABNORMAL LOW (ref 39.0–52.0)
Hemoglobin: 11.6 g/dL — ABNORMAL LOW (ref 13.0–17.0)
MCH: 30 pg (ref 26.0–34.0)
MCHC: 33.5 g/dL (ref 30.0–36.0)
MCV: 89.4 fL (ref 80.0–100.0)
Platelets: 409 10*3/uL — ABNORMAL HIGH (ref 150–400)
RBC: 3.87 MIL/uL — ABNORMAL LOW (ref 4.22–5.81)
RDW: 14.2 % (ref 11.5–15.5)
WBC: 13.5 10*3/uL — ABNORMAL HIGH (ref 4.0–10.5)
nRBC: 0 % (ref 0.0–0.2)

## 2019-01-25 MED ORDER — INFLUENZA VAC A&B SA ADJ QUAD 0.5 ML IM PRSY
0.5000 mL | PREFILLED_SYRINGE | INTRAMUSCULAR | Status: DC
  Filled 2019-01-25: qty 0.5

## 2019-01-25 NOTE — Consult Note (Addendum)
Cardiology Consultation:   Patient ID: Todd Ferguson MRN: [id number]; DOB: [birth year]  Admit date: 01/23/2019 Date of Consult: 01/25/2019  Primary Care Provider: Antony Blackbird, MD Primary Cardiologist: Dr. Percival Spanish    Patient Profile:   Todd Ferguson is a 78 y.o. 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 Evette Doffing.   History of tobacco abuse for greater than 50 pack year. Occasional drinker. Denies illicit drug use. Mother had a CABG at age 66.  Seen once by Dr.Hochrein when admitted for chest pain 07/2016. Felt atypical. Follow up POET showed non specific ST changes.   History of Present Illness:   Todd Ferguson presented 9/6 with few days history of diaphoresis, weakness, lightheaded and malodorous urine.  His symptoms occurs randomly, mostly in evening or night.   On 9/6 he was noted slow to respond and diaphoretic by wife.  EMS was called and he was found hypotensive with systolic blood pressure of 86 and heart rate of 40s.  EKG showed sinus bradycardia.  Home amlodipine discontinued and admitted for further evaluation.  One episode of dark stool.  Heme guaiac positive x2.  History of polyps in sigmoid colon.  Hemoglobin 13.4 (9/3 during ER visit)>>12.2 (9/6) >>12>>11.6 today.  Albumin 2.5.  Intermittent junctional bradycardia with PVC on telemetry.  Overnight patient had 2 episode of sinus pause lasting greater than 3 seconds (3.6 & 3.7 sec).  This morning around 11 AM he had 2.2-second pause.  Now cardiology asked for further treatment and evaluation.  Patient denies history of LOC, chest pain, shortness of breath, orthopnea, PND or lower extremity edema.  Sedentary lifestyle.  Heart Pathway Score:     Past Medical History:  Diagnosis Date  . Bradycardia   . Cataract    had one on his right eye, per pt they removed his right eye  because of the cateract.  . Hypertension   . Tobacco abuse     Past Surgical History:  Procedure Laterality Date  . APPENDECTOMY    . DENTAL SURGERY    . Removed right eye       Inpatient Medications: Scheduled Meds: . enoxaparin (LOVENOX) injection  40 mg Subcutaneous Q24H  . [START ON 01/26/2019] influenza vaccine adjuvanted  0.5 mL Intramuscular Tomorrow-1000   Continuous Infusions:  PRN Meds: Allergies:   No Known Allergies  Social History:   Social History   Socioeconomic History  . Marital status: Married    Spouse name: Not on file  . Number of children: Not on file  . Years of education: Not on file  . Highest education level: Not on file  Occupational History  . Not on file  Social Needs  . Financial resource strain: Not on file  . Food insecurity    Worry: Not on file    Inability: Not on file  . Transportation needs    Medical: Not on file    Non-medical: Not on file  Tobacco Use  . Smoking status: Current Every Day Smoker    Packs/day: 0.25    Types: Cigarettes  . Smokeless tobacco: Never Used  . Tobacco comment: occassionally   Substance and Sexual Activity  . Alcohol use: Yes    Comment: socially  . Drug use: Yes    Types: Marijuana    Comment: Marijuana about 1 week ago.  Marland Kitchen Sexual  activity: Not on file  Lifestyle  . Physical activity    Days per week: Not on file    Minutes per session: Not on file  . Stress: Not on file  Relationships  . Social Herbalist on phone: Not on file    Gets together: Not on file    Attends religious service: Not on file    Active member of club or organization: Not on file    Attends meetings of clubs or organizations: Not on file    Relationship status: Not on file  . Intimate partner violence    Fear of current or ex partner: Not on file    Emotionally abused: Not on file    Physically abused: Not on file    Forced sexual activity: Not on file  Other Topics Concern  . Not on file  Social  History Narrative  . Not on file    Family History:   Family History  Problem Relation Age of Onset  . Diabetes Brother   . Heart disease Mother   . Kidney disease Mother   . Colon cancer Neg Hx   . Esophageal cancer Neg Hx   . Rectal cancer Neg Hx   . Stomach cancer Neg Hx   . Prostate cancer Neg Hx   . Pancreatic cancer Neg Hx      ROS:  Please see the history of present illness.  All other ROS reviewed and negative.     Physical Exam/Data:   Vitals:   01/25/19 0458 01/25/19 0649 01/25/19 0745 01/25/19 0800  BP: 135/68  136/69 132/67  Pulse:  70 81 78  Resp: 18  18   Temp:  98.3 F (36.8 C) 98.3 F (36.8 C)   TempSrc:  Oral Oral   SpO2:  98% 99% 98%  Weight:  88.9 kg    Height:        Intake/Output Summary (Last 24 hours) at 01/25/2019 1353 Last data filed at 01/25/2019 1145 Gross per 24 hour  Intake 240 ml  Output 1125 ml  Net -885 ml   Last 3 Weights 01/25/2019 01/23/2019 03/04/2018  Weight (lbs) 195 lb 15.9 oz 196 lb 210 lb 3.2 oz  Weight (kg) 88.901 kg 88.905 kg 95.346 kg     Body mass index is 33.64 kg/m.  General:  Well nourished, well developed, in no acute distress HEENT: normal Lymph: no adenopathy Neck: no JVD Endocrine:  No thryomegaly Vascular: No carotid bruits; FA pulses 2+ bilaterally without bruits  Cardiac:  normal S1, S2; RRR; no murmur  Lungs:  clear to auscultation bilaterally, no wheezing, rhonchi or rales  Abd: soft, nontender, no hepatomegaly  Ext: no edema Musculoskeletal:  No deformities, BUE and BLE strength normal and equal Skin: warm and dry  Neuro:  CNs 2-12 intact, no focal abnormalities noted Psych:  Normal affect   EKG:  The EKG 01/23/19 was personally reviewed and demonstrates: Sinus bradycardia at rate of 52 bpm Telemetry:  Telemetry was personally reviewed and demonstrates: Sinus bradycardia/junctional rhythm at rate mostly in 40s to 50, intermittent pause-longest 3.7-second overnight  Relevant CV Studies:  Echo  01/24/2019 1. The left ventricle has normal systolic function with an ejection fraction of 60-65%. The cavity size was normal. Left ventricular diastolic function could not be evaluated secondary to atrial fibrillation.  2. The right ventricle has normal systolic function. The cavity was normal. There is no increase in right ventricular wall thickness. Right ventricular systolic pressure could  not be assessed.  3. Mild calcification of the anterior mitral valve leaflet.  4. The aortic valve is tricuspid. Moderate calcification of the aortic valve. Aortic valve regurgitation was not assessed by color flow Doppler. Mild stenosis of the aortic valve.  5. The aorta is normal unless otherwise noted.  6. The inferior vena cava was dilated in size with >50% respiratory variability.   POET 07/2016  Blood pressure demonstrated a normal response to exercise.  Horizontal ST segment depression ST segment depression of 1 mm was noted during stress in the III and II leads, beginning at 3 minutes of stress, and returning to baseline after less than 1 minute of recovery.  No T wave inversion was noted during stress.  Laboratory Data:  High Sensitivity Troponin:  No results for input(s): TROPONINIHS in the last 720 hours.   Chemistry Recent Labs  Lab 01/20/19 0217 01/23/19 0508 01/23/19 0832 01/24/19 0243  NA 134* 137  --  137  K 3.7 3.4*  --  4.8  CL 100 105  --  107  CO2 23 22  --  23  GLUCOSE 183* 126*  --  107*  BUN 12 11  --  13  CREATININE 1.31* 1.08 1.02 1.06  CALCIUM 8.7* 8.5*  --  8.6*  GFRNONAA 53* >60 >60 >60  GFRAA >60 >60 >60 >60  ANIONGAP 11 10  --  7    Recent Labs  Lab 01/23/19 0508 01/24/19 0243  PROT 6.4* 6.4*  ALBUMIN 2.5* 2.5*  AST 19 19  ALT 21 20  ALKPHOS 56 54  BILITOT 0.4 0.4   Hematology Recent Labs  Lab 01/23/19 0508 01/24/19 0243 01/25/19 0434  WBC 10.4 12.8* 13.5*  RBC 4.11* 4.04* 3.87*  HGB 12.2* 12.0* 11.6*  HCT 37.5* 36.6* 34.6*  MCV 91.2 90.6  89.4  MCH 29.7 29.7 30.0  MCHC 32.5 32.8 33.5  RDW 13.9 14.1 14.2  PLT 396 411* 409*    Radiology/Studies:  Dg Chest Port 1 View  Result Date: 01/23/2019 CLINICAL DATA:  Initial evaluation for acute weakness. EXAM: PORTABLE CHEST 1 VIEW COMPARISON:  Prior radiograph from 06/10/2017. FINDINGS: Transverse heart size at the upper limits of normal. Mediastinal silhouette within normal limits. Aortic atherosclerosis. Lungs normally inflated. No focal infiltrates. No edema or effusion. No pneumothorax. No acute osseous finding. IMPRESSION: 1. No active cardiopulmonary disease. 2. Aortic atherosclerosis. Electronically Signed   By: Jeannine Boga M.D.   On: 01/23/2019 06:00    Assessment and Plan:   1. Symptomatic bradycardia with sinus pauses and junctional rhythm -His symptoms concerning for symptomatic bradycardia with intermittent fatigue/weakness/dizziness.  Overall, he has sinus rhythm with intermittent junctional bradycardia in high 30s to low 40s.  Last dose of Amlodipine Saturday. Overnight patient had 3.6 and 3.7 sec pause. This morning had 2.2 sec pause at 11am.  He may have underlying sleep apnea.  Continue to hold any rate control/AV blocking agent.  Prior history of bradycardia on beta-blocker.  2.  Positive stool guaiac - Prior history of colonic polyps. - Hemoglobin 13.4 (9/3 during ER visit)>>12.2 (9/6) >>12>>11.6 today.   - Per primary team  3. HTN - BP stable. - Not on any agent currently   For questions or updates, please contact [place] Please consult [email or web address] for contact info under    Jarrett Soho, PA  01/25/2019 1:53 PM   Patient examined chart reviewed. Obese black male clear lungs soft abdomen no edema palpable pulses normal heart sounds. History of  bradycardia. ETT 2018 able to get HR up to 127 bpm after 6 minute ETT. Telemetry with some sinus node exit block and 3 plus second pauses. No prolonged AV block Baseline ECG essentially  normal . His admitting symptoms were prolonged and did not correlate with persistent bradycardia. Will order ETT tomorrow to assess chronotropic response Degree of AV block and bradycardia with norvasc is quite low. Will have EP since issue is wether he needs PPM and he will likely need outpatient f/u with them and more monitoring   Todd Ferguson

## 2019-01-25 NOTE — Progress Notes (Signed)
  Subjective:  Patient seen at bedside this AM.  Patient has no complaints today.  Denies chest pain, shortness of breath or malaise today.  Patient says he takes amlodipine at home, usually in the mornings.  He reports last taking his medication on Saturday in the morning.   Objective:    Vital Signs (last 24 hours): Vitals:   01/25/19 0458 01/25/19 0649 01/25/19 0745 01/25/19 0800  BP: 135/68  136/69 132/67  Pulse:  70 81 78  Resp: 18  18   Temp:  98.3 F (36.8 C) 98.3 F (36.8 C)   TempSrc:  Oral Oral   SpO2:  98% 99% 98%  Weight:  88.9 kg    Height:        Physical Exam: General Alert and answers questions appropriately, no acute distress  Cardiac Regular rate and rhythm, no murmurs, rubs, or gallops  Pulmonary Clear to auscultation bilaterally without wheezes, rhonchi, or rales  Extremities No peripheral edema    Assessment/Plan:   Principal Problem:   Sinus bradycardia Active Problems:   Essential hypertension, benign  Patient is a 78 year old male who presented with 8-day history of diaphoresis, chills, fatigue and daytime somnolence.  He was found to be hypotensive and bradycardic on presentation.  Symptomatic bradycardia: Sinus Pauses: Junctional escape rhythm: Patient with 1 week of diaphoresis, chills, fatigue, generalized malaise.  He was without signs of infection (UA within normal limits, chest x-ray clear) and TSH within normal limits.  EKG showed junctional rhythm, patient is occasionally in normal sinus rhythm as demonstrated on EKG this morning.  Overnight patient had 3 episodes of 3-second sinus pauses.  We considered amlodipine as source of patient's bradycardia but last dose of his medication was Saturday morning.  Echocardiogram showed normal EF of 60-65%, no evidence for flow obstruction.  Given symptomatic bradycardia, patient may be a candidate for pacemaker.  Discussed this possibility with patient, and patient wants time to think about it. *  Continue telemetry to monitor overnight * We will consult cardiology for further evaluation, we appreciate their assistance  # Possible FOBT: Patient hemoglobin stable at 11.6, 12.2 yesterday.  Given patient history of colonic polyps on colonoscopy May 2017, patient is due for colonoscopy which can be done as outpatient if hemoglobin remained stable.  Diet: Heart Diet DVT Ppx: Lovenox Dispo: Anticipated discharge pending clinical improvement.  Jeanmarie Hubert, MD 01/25/2019, 10:34 AM Pager: [phone]

## 2019-01-25 NOTE — Consult Note (Addendum)
ELECTROPHYSIOLOGY CONSULT NOTE    Patient ID: Todd Ferguson MRN: [id number], DOB/AGE: Oct 08, 1940 78 y.o.  Admit date: 01/23/2019 Date of Consult: 01/25/2019  Primary Physician: Antony Blackbird, MD Primary Cardiologist: Dr. Percival Spanish Electrophysiologist: New  Referring Provider: Dr. Johnsie Cancel  Patient Profile: Todd Ferguson is a 78 y.o. 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 Johnsie Cancel.  HPI:  Todd Ferguson is a 78 y.o. male who presented 01/23/2019 with several days of diaphoresis, weakness, lightheadedness, and strong smelling urine. UCx negative, afebrile. On 9/6, per his wife, he was "slow to respond to her" and  diaphoretic by his wife. EMS was called and reported hypotension in the 80s with heart rate in the 40s. EKG showed sinus bradycardia. Pt was on amlodipine 10 mg daily which was stopped.  He reported dark stools at home and heme guaiac positive x 2.   Noted to have pauses overnight up to 3.6 to 4.1 seconds.  He is feeling OK currently. He is not sure if he feels his pauses or not.  They are mostly during the night, and his two pauses today were during short naps, as far as he remembers.  He gets occasional lightheaded at home, with no specific aggravating factors. He has had 1 or 2 episodes of lightheadedness with rapid standing, but not often.  His "lightheadedness" occurs most often when lying down, or at times when standing for a prolonged period.  He has a distant (years) history of near syncope while sitting on the porch with friends and felt like things were "going black". He has occasional palpitations where his heart feels like it is "skipping a beat", but he doesn't think his lightheadedness has been associated with this. He denies chest pain,  dyspnea, PND, orthopnea, nausea, vomiting, dizziness, syncope, edema,  weight gain, or early satiety. He occasionally wakes himself up snoring.  No known apneic episodes during sleep per pt.   Past Medical History:  Diagnosis Date  . Bradycardia   . 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  . Hypertension   . Tobacco abuse      Surgical History:  Past Surgical History:  Procedure Laterality Date  . APPENDECTOMY    . DENTAL SURGERY    . Removed right eye       Medications Prior to Admission  Medication Sig Dispense Refill Last Dose  . amLODipine (NORVASC) 10 MG tablet Take 1 tablet (10 mg total) by mouth daily. 30 tablet 0 01/22/2019 at Unknown time  . aspirin EC 81 MG tablet Take 1 tablet (81 mg total) by mouth daily. 30 tablet 6 01/22/2019 at Unknown time  . lisinopril (ZESTRIL) 40 MG tablet Take 1 tablet (40 mg total) by mouth daily. 30 tablet 0 01/22/2019 at Unknown time  . amLODipine (NORVASC) 10 MG tablet Take 1 tablet (10 mg total) by mouth daily. To lower blood pressure (Patient not taking: Reported on 01/23/2019) 90 tablet 1 Not Taking at Unknown time  . cephALEXin (KEFLEX) 500 MG capsule Take 1 capsule (500 mg total) by mouth 3 (three) times daily. (Patient not taking: Reported on 03/04/2018) 21 capsule 0 Completed Course at Unknown time  . Colchicine 0.6 MG CAPS Take 2 tablets now (Patient not taking: Reported on 07/09/2017) 2  capsule 0 Completed Course at Unknown time  . ibuprofen (ADVIL,MOTRIN) 800 MG tablet Take 1 tablet (800 mg total) by mouth every 8 (eight) hours as needed. (Patient not taking: Reported on 03/04/2018) 60 tablet 0 Completed Course at Unknown time  . lisinopril (PRINIVIL,ZESTRIL) 40 MG tablet Take 1 tablet (40 mg total) by mouth daily. (Patient not taking: Reported on 01/23/2019) 90 tablet 1 Not Taking at Unknown time    Inpatient Medications:  . enoxaparin (LOVENOX) injection  40 mg Subcutaneous Q24H  . [START ON 01/26/2019] influenza vaccine adjuvanted  0.5 mL Intramuscular  Tomorrow-1000    Allergies: No Known Allergies  Social History   Socioeconomic History  . Marital status: Married    Spouse name: Not on file  . Number of children: Not on file  . Years of education: Not on file  . Highest education level: Not on file  Occupational History  . Not on file  Social Needs  . Financial resource strain: Not on file  . Food insecurity    Worry: Not on file    Inability: Not on file  . Transportation needs    Medical: Not on file    Non-medical: Not on file  Tobacco Use  . Smoking status: Current Every Day Smoker    Packs/day: 0.25    Types: Cigarettes  . Smokeless tobacco: Never Used  . Tobacco comment: occassionally   Substance and Sexual Activity  . Alcohol use: Yes    Comment: socially  . Drug use: Yes    Types: Marijuana    Comment: Marijuana about 1 week ago.  Marland Kitchen Sexual activity: Not on file  Lifestyle  . Physical activity    Days per week: Not on file    Minutes per session: Not on file  . Stress: Not on file  Relationships  . Social Herbalist on phone: Not on file    Gets together: Not on file    Attends religious service: Not on file    Active member of club or organization: Not on file    Attends meetings of clubs or organizations: Not on file    Relationship status: Not on file  . Intimate partner violence    Fear of current or ex partner: Not on file    Emotionally abused: Not on file    Physically abused: Not on file    Forced sexual activity: Not on file  Other Topics Concern  . Not on file  Social History Narrative  . Not on file     Family History  Problem Relation Age of Onset  . Diabetes Brother   . Heart disease Mother   . Kidney disease Mother   . Colon cancer Neg Hx   . Esophageal cancer Neg Hx   . Rectal cancer Neg Hx   . Stomach cancer Neg Hx   . Prostate cancer Neg Hx   . Pancreatic cancer Neg Hx      Review of Systems: All other systems reviewed and are otherwise negative except as  noted above.  Physical Exam: Vitals:   01/25/19 0649 01/25/19 0745 01/25/19 0800 01/25/19 1415  BP:  136/69 132/67 128/66  Pulse: 70 81 78 68  Resp:  18  19  Temp: 98.3 F (36.8 C) 98.3 F (36.8 C)  98.6 F (37 C)  TempSrc: Oral Oral  Oral  SpO2: 98% 99% 98% 98%  Weight: 88.9 kg     Height:  GEN- The patient is well appearing, alert and oriented x 3 today.   HEENT: normocephalic, atraumatic; sclera clear, conjunctiva pink; hearing intact; oropharynx clear; neck supple Lungs- Clear to ausculation bilaterally, normal work of breathing.  No wheezes, rales, rhonchi Heart- Regular rate and rhythm, no murmurs, rubs or gallops GI- soft, non-tender, non-distended, bowel sounds present Extremities- no clubbing, cyanosis, or edema; DP/PT/radial pulses 2+ bilaterally MS- no significant deformity or atrophy Skin- warm and dry, no rash or lesion Psych- euthymic mood, full affect Neuro- strength and sensation are intact  Labs:   Lab Results  Component Value Date   WBC 13.5 (H) 01/25/2019   HGB 11.6 (L) 01/25/2019   HCT 34.6 (L) 01/25/2019   MCV 89.4 01/25/2019   PLT 409 (H) 01/25/2019    Recent Labs  Lab 01/24/19 0243  NA 137  K 4.8  CL 107  CO2 23  BUN 13  CREATININE 1.06  CALCIUM 8.6*  PROT 6.4*  BILITOT 0.4  ALKPHOS 54  ALT 20  AST 19  GLUCOSE 107*      Radiology/Studies: Dg Chest Port 1 View  Result Date: 01/23/2019 CLINICAL DATA:  Initial evaluation for acute weakness. EXAM: PORTABLE CHEST 1 VIEW COMPARISON:  Prior radiograph from 06/10/2017. FINDINGS: Transverse heart size at the upper limits of normal. Mediastinal silhouette within normal limits. Aortic atherosclerosis. Lungs normally inflated. No focal infiltrates. No edema or effusion. No pneumothorax. No acute osseous finding. IMPRESSION: 1. No active cardiopulmonary disease. 2. Aortic atherosclerosis. Electronically Signed   By: Jeannine Boga M.D.   On: 01/23/2019 06:00   EKG:01/23/2019 shows sinus  brady at 52 bpm, PR interval 148 ms, QRS 94 ms, QT 420 ms (personally reviewed) 01/24/2019 shows junctional bradycardia at 51 bpm, QRS 80 (personally reviewed)  TELEMETRY: NSR/SB 50-60s with sinus node dysfunction. Marked nocturnal bradycardia, but intermittent pauses throughout the day as well (personally reviewed)  Assessment/Plan: 1.  Sinus node dysfunction With > 3 second pauses overnight. Amlopipine stopped.  History is not entirely convincing of symptomatic bradycardia and pt has several risk factors for sleep apnea with no directly attributable symptoms of near syncope or lightheadedness in correlation with his bradycardia.   Looking back, he has chronic bradycardia in the 40-50s going back as far as 2016. Would recommend sleep study. Avoid AV nodal blocking agents  2. Positive stool guiaiac Hgb 13.4 (9/3 during ER visit) >> 12.2 (9/6) and 11.6 today. IM following. Plan outpatient colonoscopy, for which he is due s/p Colon with polyps 5/17, if stable.   3. HTN Stable off amlodipine.   4. Suspected sleep apnea Would benefit from outpatient sleep study.   For questions or updates, please contact [place] Please consult [email or web address] for contact info under Cardiology/STEMI.  Signed, Shirley Friar, PA-C  01/25/2019 2:51 PM   I have seen, examined the patient, and reviewed the above assessment and plan.  Changes to above are made where necessary.  On exam, iRRR. The patient has chronic sinus bradycardia.  I am not convinced that this is the cause for his symptoms.   There is certainly no urgent  indication for pacing.  The patient is clear that he would like to avoid pacing at this time.  No further inpatient EP workup planned. I agree with outpatient sleep study.  Electrophysiology team to see as needed while here. Please call with questions.   Co Sign: Thompson Grayer, MD

## 2019-01-26 ENCOUNTER — Other Ambulatory Visit: Payer: Self-pay | Admitting: Family Medicine

## 2019-01-26 ENCOUNTER — Inpatient Hospital Stay (HOSPITAL_COMMUNITY): Payer: Medicare Other

## 2019-01-26 DIAGNOSIS — R001 Bradycardia, unspecified: Secondary | ICD-10-CM

## 2019-01-26 LAB — EXERCISE TOLERANCE TEST
Estimated workload: 7 METS
Exercise duration (min): 6 min
Exercise duration (sec): 29 s
MPHR: 145 {beats}/min
Peak HR: 130 {beats}/min
Percent HR: 89 %
RPE: 16992
Rest HR: 78 {beats}/min

## 2019-01-26 LAB — CBC
HCT: 35.6 % — ABNORMAL LOW (ref 39.0–52.0)
Hemoglobin: 11.9 g/dL — ABNORMAL LOW (ref 13.0–17.0)
MCH: 30 pg (ref 26.0–34.0)
MCHC: 33.4 g/dL (ref 30.0–36.0)
MCV: 89.7 fL (ref 80.0–100.0)
Platelets: 487 10*3/uL — ABNORMAL HIGH (ref 150–400)
RBC: 3.97 MIL/uL — ABNORMAL LOW (ref 4.22–5.81)
RDW: 14 % (ref 11.5–15.5)
WBC: 11.4 10*3/uL — ABNORMAL HIGH (ref 4.0–10.5)
nRBC: 0 % (ref 0.0–0.2)

## 2019-01-26 NOTE — Progress Notes (Signed)
Patient ID: Todd Ferguson, male   DOB: 1940/09/08, 78 y.o.   MRN: [id number]   Patient is status post recent emergency department visit and was found to have sinus bradycardia and referred to cardiology.  Per cardiology note, patient needs to have sleep study in order will be placed and patient notified.

## 2019-01-26 NOTE — Care Management Important Message (Signed)
Important Message  Patient Details  Name: Todd Ferguson MRN: [id number] Date of Birth: [birth year]   Medicare Important Message Given:  Yes     Shelda Altes 01/26/2019, 12:16 PM

## 2019-01-26 NOTE — Progress Notes (Signed)
    Subjective:  Denies SSCP, palpitations or Dyspnea For ETT today   Objective:  Vitals:   01/25/19 1415 01/25/19 2000 01/25/19 2003 01/26/19 0452  BP: 128/66   130/64  Pulse: 68 78  69  Resp: 19     Temp: 98.6 F (37 C)  98.4 F (36.9 C) 98.7 F (37.1 C)  TempSrc: Oral  Oral Oral  SpO2: 98% 98%  96%  Weight:    94.7 kg  Height:        Intake/Output from previous day:  Intake/Output Summary (Last 24 hours) at 01/26/2019 0856 Last data filed at 01/25/2019 1600 Gross per 24 hour  Intake -  Output 750 ml  Net -750 ml    Physical Exam:  Affect appropriate Overweight black male  HEENT: normal Neck supple with no adenopathy JVP normal no bruits no thyromegaly Lungs clear with no wheezing and good diaphragmatic motion Heart:  S1/S2 no murmur, no rub, gallop or click PMI normal Abdomen: benighn, BS positve, no tenderness, no AAA no bruit.  No HSM or HJR Distal pulses intact with no bruits No edema Neuro non-focal Skin warm and dry No muscular weakness   Lab Results: Basic Metabolic Panel: Recent Labs    01/24/19 0243  NA 137  K 4.8  CL 107  CO2 23  GLUCOSE 107*  BUN 13  CREATININE 1.06  CALCIUM 8.6*   Liver Function Tests: Recent Labs    01/24/19 0243  AST 19  ALT 20  ALKPHOS 54  BILITOT 0.4  PROT 6.4*  ALBUMIN 2.5*   No results for input(s): LIPASE, AMYLASE in the last 72 hours. CBC: Recent Labs    01/25/19 0434 01/26/19 0323  WBC 13.5* 11.4*  HGB 11.6* 11.9*  HCT 34.6* 35.6*  MCV 89.4 89.7  PLT 409* 487*    Imaging: No results found.  Cardiac Studies:  ECG: SR no AV block   Telemetry: NSR PVC;s   Echo: EF 60-65%   Medications:   . enoxaparin (LOVENOX) injection  40 mg Subcutaneous Q24H  . influenza vaccine adjuvanted  0.5 mL Intramuscular Tomorrow-1000      Assessment/Plan:   Bradycardia:  Chronic dates back to 2016.  Norvasc d/c EP does not feel PPM indicated For ETT today to assess chronotropic competence Needs  primary care f/u to order sleep study and assess for OSA as most of his pauses are nocturnal He indicates having an appointment with medicaid doctor in next couple weeks Likely d/c latter today will arrange f/u with EP Dr Rayann Heman .    Jenkins Rouge 01/26/2019, 8:56 AM

## 2019-01-26 NOTE — Discharge Instructions (Signed)
You were seen in the hospital for increased sweating and fatigue.  We checked your thyroid and heart function and do not see any abnormalities that would explain your symptoms.  We do think that your symptoms may be the result of sleep apnea.  At your follow-up appointment with your primary care provider, please discuss having a sleep study to diagnose this.  Please call your doctor or return to the emergency department if you experiencing elevated fever, chest pain, severe dizziness, or any other concerning symptom.

## 2019-01-26 NOTE — Progress Notes (Signed)
ETT showed no ischemia or chronotropic incompetence Maximum HR 89% PMHR at 139 bpm Ok to d/c home off norvasc  Will arrange outpatient f/u with EP and Dr Shelda Altes

## 2019-01-26 NOTE — Progress Notes (Signed)
  Subjective:  Patient reports he is doing well this morning.  Patient denies chest pain, shortness of breath, fatigue.  Patient reports he is back to baseline and ready to return home.   Objective:    Vital Signs (last 24 hours): Vitals:   01/25/19 2000 01/25/19 2003 01/26/19 0452 01/26/19 1443  BP:   130/64 138/74  Pulse: 78  69 72  Resp:      Temp:  98.4 F (36.9 C) 98.7 F (37.1 C) 98.4 F (36.9 C)  TempSrc:  Oral Oral Oral  SpO2: 98%  96% 98%  Weight:   94.7 kg   Height:        Physical Exam: General Alert and answers questions appropriately, no acute distress  Cardiac Regular rate and rhythm, no murmurs, rubs, or gallops  Pulmonary Clear to auscultation bilaterally without wheezes, rhonchi, or rales  Extremities No peripheral edema      Assessment/Plan:   Principal Problem:   Sinus bradycardia Active Problems:   Essential hypertension, benign  Patient is 78 year old male who presented with a day history of diaphoresis, chills, fatigue and daytime somnolence.  He was found to be hypotensive and bradycardic on presentation.  Bradycardia: Sinus Pauses: Junctional escape rhythm: Patient presented with 1 week history of diaphoresis, chills, fatigue, generalized malaise.  He was without signs of infection and TSH was within normal limits.  Patient has chronic bradycardia dating back to 2016.  Amlodipine has been discontinued and electrophysiology does not feel that a permanent pacemaker is indicated.  ETT shows no ischemia or chronotropic incompetence.  Per cardiology, patient is okay to discharge home off of the amlodipine.  # Positive FOBT: Patient presented with an approximately 1 g drop in hemoglobin to 12.2.  Patient hemoglobin has been stable during hospitalization with hemoglobin of 11.9 this morning.  Dispo: Plan for discharge today.   Jeanmarie Hubert, MD 01/26/2019, 4:15 PM Pager: [phone]

## 2019-01-27 ENCOUNTER — Telehealth: Payer: Self-pay

## 2019-01-27 NOTE — Telephone Encounter (Signed)
Call placed to patient and informed him that Dr Chapman Fitch has placed an order for a split night sleep study.  [facility] sleep center will be contacting him to schedule the procedure.  This CM also explained that some insurance companies require the sleep study to be done at home verses in the sleep center and when the sleep center contacts him they will explain if this will be done at home or in the center.    He then said that he needs a refill of his BP medication.  Instructed him to contact [facility] pharmacy.  He said that he has the phone number. Then instructed him to call this CM back if he has any further questions.

## 2019-01-28 MED FILL — LISINOPRIL 40 MG TABLET: 40 | 90 days supply | Qty: 90 | Fill #1

## 2019-01-29 NOTE — Discharge Summary (Signed)
Name: Todd Ferguson MRN: [id number] DOB: [birth year] 78 y.o. PCP: Antony Blackbird, MD  Date of Admission: 01/23/2019  4:43 AM Date of Discharge: 01/26/2019 Attending Physician: Dr. Lalla Brothers  Discharge Diagnosis: 1. Symptomatic bradycardia with sinus pauses and junctional rhythm 2. Positive stool guaiac  Discharge Medications: Allergies as of 01/26/2019   No Known Allergies     Medication List    STOP taking these medications   amLODipine 10 MG tablet Commonly known as: NORVASC   cephALEXin 500 MG capsule Commonly known as: KEFLEX     TAKE these medications   aspirin EC 81 MG tablet Take 1 tablet (81 mg total) by mouth daily.   Colchicine 0.6 MG Caps Take 2 tablets now   ibuprofen 800 MG tablet Commonly known as: ADVIL Take 1 tablet (800 mg total) by mouth every 8 (eight) hours as needed.   lisinopril 40 MG tablet Commonly known as: ZESTRIL Take 1 tablet (40 mg total) by mouth daily. What changed: Another medication with the same name was removed. Continue taking this medication, and follow the directions you see here.       Disposition and follow-up:   Todd Ferguson was discharged from [facility] in Stable condition.  At the hospital follow up visit please address:  1.  Pleae ensure that patient has discontinued taking amlodipine. Consider EKG to ensure patient in normal sinus rhythm. Please arrange for patient to have colonoscopy (see below).  2.  Labs / imaging needed at time of follow-up: None  3.  Pending labs/ test needing follow-up: None  Follow-up Appointments: Follow-up Information    Fulp, Cammie, MD. Go in 1 day(s).   Specialty: Family Medicine Why: Please go to your scheduled appointment with your primary care provider tomorrow Contact information: [place] [address] [phone]           Hospital Course by problem list: 1. Asymptomatic bradycardia: On presentation, patient was  bradycardic to mid 30s and hypotensive with systolic in the [id number].  Per patient's wife, patient had a week's worth of nighttime diaphoresis, chills, chest discomfort, decreased energy, and moments of altered mental status.  No infectious etiology was identified, patient without urinary symptoms and urine culture negative, chest x-ray without infiltrate.  His UDS screen was negative, A1c stable at 6.4%, TSH within normal limits.  Echocardiogram did not show any evidence for obstruction.  On EKG patient was noted to alternate between junctional rhythm and normal sinus rhythm.  On telemetry patient also had 3 episodes of 3-second sinus pauses which were asymptomatic.    Cardiology was consulted, and patient had exercise tolerance test performed which showed no ischemia or chronotropic incompetence.  On day of discharge, patient's heart rate over the prior 24 hours ranged between 68 and 89.  Patient had last taken his amlodipine on Saturday 9/5.  Patient was discharged with instructions to discontinue this medication as it may have been the cause of his bradycardia.  # Blood in stool: Stool guaiac was positive in the ER and patient reported a history of dark stools x1 episode.  Patient hemoglobin on presentation was 12.2 which remained stable during hospitalization.  On colonoscopy in May 2017, patient had 4 sessile polyps in sigmoid and ascending colon for which follow-up would be recommended at 3-5 years.  Patient is due for this follow-up colonoscopy, and patient informed on the importance of having this test performed.   Discharge Vitals:   BP 138/74 (BP Location:  Right Arm)   Pulse 72   Temp 98.4 F (36.9 C) (Oral)   Resp 19   Ht 5\' 4"  (1.626 m)   Wt 94.7 kg   SpO2 98%   BMI 35.84 kg/m   Pertinent Labs, Studies, and Procedures:  CBC Latest Ref Rng & Units 01/26/2019 01/25/2019 01/24/2019  WBC 4.0 - 10.5 K/uL 11.4(H) 13.5(H) 12.8(H)  Hemoglobin 13.0 - 17.0 g/dL 11.9(L) 11.6(L) 12.0(L)  Hematocrit 39.0  - 52.0 % 35.6(L) 34.6(L) 36.6(L)  Platelets 150 - 400 K/uL 487(H) 409(H) 411(H)   BMP Latest Ref Rng & Units 01/24/2019 01/23/2019 01/23/2019  Glucose 70 - 99 mg/dL 107(H) - 126(H)  BUN 8 - 23 mg/dL 13 - 11  Creatinine 0.61 - 1.24 mg/dL 1.06 1.02 1.08  BUN/Creat Ratio 10 - 24 - - -  Sodium 135 - 145 mmol/L 137 - 137  Potassium 3.5 - 5.1 mmol/L 4.8 - 3.4(L)  Chloride 98 - 111 mmol/L 107 - 105  CO2 22 - 32 mmol/L 23 - 22  Calcium 8.9 - 10.3 mg/dL 8.6(L) - 8.5(L)  Magnesium (9/6): 2.0  UDS (01/23/2019): Negative  Lab Results  Component Value Date   HGBA1C 6.4 (H) 01/23/2019   Lab Results  Component Value Date   TSH 0.794 01/23/2019   POC Occult Blood (01/23/2019): Positive  Urine culture (01/23/2019): No growth  CXR (01/23/2019): IMPRESSION: 1. No active cardiopulmonary disease. 2. Aortic atherosclerosis.  Echocardiogram (01/24/2019): IMPRESSIONS:  1. The left ventricle has normal systolic function with an ejection fraction of 60-65%. The cavity size was normal. Left ventricular diastolic function could not be evaluated secondary to atrial fibrillation.  2. The right ventricle has normal systolic function. The cavity was normal. There is no increase in right ventricular wall thickness. Right ventricular systolic pressure could not be assessed.  3. Mild calcification of the anterior mitral valve leaflet.  4. The aortic valve is tricuspid. Moderate calcification of the aortic valve. Aortic valve regurgitation was not assessed by color flow Doppler. Mild stenosis of the aortic valve.  5. The aorta is normal unless otherwise noted.  6. The inferior vena cava was dilated in size with >50% respiratory variability.   Discharge Instructions: Discharge Instructions    Call MD for:  difficulty breathing, headache or visual disturbances   Complete by: As directed    Call MD for:  persistant dizziness or light-headedness   Complete by: As directed    Call MD for:  persistant nausea and vomiting    Complete by: As directed    Call MD for:  temperature >100.4   Complete by: As directed    Diet - low sodium heart healthy   Complete by: As directed    Discharge instructions   Complete by: As directed    Thank you for your visit to the [facility]. We have evaluated you and do not see any signs of infection or other concerning process. We have stopped your Amlodipine due to a slow heart rate and lower blood pressure and recommend that you see your outpatient doctor to discuss any potential changes that may be needed to your medicines.   Increase activity slowly   Complete by: As directed    Increase activity slowly   Complete by: As directed       Signed: Jeanmarie Hubert, MD 01/31/2019, 6:25 AM Pager: [phone]

## 2019-02-08 NOTE — Progress Notes (Signed)
PCP:  Antony Blackbird, MD Primary Cardiologist: No primary care provider on file. Electrophysiologist: None   Todd Ferguson is a 78 y.o. 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 Pt was admitted for AMS and found to have hypotension and bradycardia. Noted to have pauses of 3.6 - 4.1 seconds while sleeping, relatively asymptomatic.  They are seen for Dr Rayann Heman. Since discharge, the patient reports doing well.  He is at home with minimal complaints.  He has occasional lightheadedness, which occurred as well during his hospital stay, but had not correlation on telemetry.  The patient feels that he is tolerating medications without difficulties and is otherwise without complaint today.   Past Medical History:  Diagnosis Date  . Bradycardia   . 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  . Hypertension   . Tobacco abuse    Past Surgical History:  Procedure Laterality Date  . APPENDECTOMY    . DENTAL SURGERY    . Removed right eye      Current Outpatient Medications  Medication Sig Dispense Refill  . aspirin EC 81 MG tablet Take 1 tablet (81 mg total) by mouth daily. 30 tablet 6  . lisinopril (PRINIVIL,ZESTRIL) 40 MG tablet Take 1 tablet (40 mg total) by mouth daily. 90 tablet 1   No current facility-administered medications for this visit.     No Known Allergies  Social History   Socioeconomic History  . Marital status: Married    Spouse name: Not on file  . Number of children: Not on file  . Years of education: Not on file  . Highest education level: Not on file  Occupational History  . Not on file  Social Needs  . Financial resource strain: Not on file  . Food insecurity    Worry: Not on file    Inability: Not on file  . Transportation needs    Medical: Not on file    Non-medical: Not on file  Tobacco Use  .  Smoking status: Current Every Day Smoker    Packs/day: 0.25    Types: Cigarettes  . Smokeless tobacco: Never Used  . Tobacco comment: occassionally   Substance and Sexual Activity  . Alcohol use: Yes    Comment: socially  . Drug use: Yes    Types: Marijuana    Comment: Marijuana about 1 week ago.  Marland Kitchen Sexual activity: Not on file  Lifestyle  . Physical activity    Days per week: Not on file    Minutes per session: Not on file  . Stress: Not on file  Relationships  . Social Herbalist on phone: Not on file    Gets together: Not on file    Attends religious service: Not on file    Active member of club or organization: Not on file    Attends meetings of clubs or organizations: Not on file    Relationship status: Not on file  . Intimate partner violence    Fear of current or ex partner: Not on file    Emotionally abused: Not on file    Physically abused: Not on file    Forced sexual activity: Not on file  Other Topics Concern  . Not on file  Social History Narrative  . Not on file  Review of Systems: General: No chills, fever, night sweats or weight changes  Cardiovascular:  No chest pain, dyspnea on exertion, edema, orthopnea, palpitations, paroxysmal nocturnal dyspnea Dermatological: No rash, lesions or masses Respiratory: No cough, dyspnea Urologic: No hematuria, dysuria Abdominal: No nausea, vomiting, diarrhea, bright red blood per rectum, melena, or hematemesis Neurologic: No visual changes, weakness, changes in mental status All other systems reviewed and are otherwise negative except as noted above.  Physical Exam: Vitals:   02/09/19 1020  BP: (!) 144/90  Pulse: 64  Weight: 203 lb (92.1 kg)  Height: 5\' 4"  (1.626 m)    GEN- The patient is well appearing, alert and oriented x 3 today.   HEENT: normocephalic, atraumatic; sclera clear, conjunctiva pink; hearing intact; oropharynx clear; neck supple, no JVP Lymph- no cervical lymphadenopathy Lungs-  Clear to ausculation bilaterally, normal work of breathing.  No wheezes, rales, rhonchi Heart- Regular rate and rhythm, no murmurs, rubs or gallops, PMI not laterally displaced GI- soft, non-tender, non-distended, bowel sounds present, no hepatosplenomegaly Extremities- no clubbing, cyanosis, or edema; DP/PT/radial pulses 2+ bilaterally MS- no significant deformity or atrophy Skin- warm and dry, no rash or lesion Psych- euthymic mood, full affect Neuro- strength and sensation are intact  EKG is not ordered today.   Assessment and Plan:  1. Sinus node dysfunction Pt has chronic bradycardia as far back as 2016.  Pt has not had confirmed symptoms correlated with bradycardia, the majority of his pauses occurred at night or during sleep. Sleep study has been ordered. Needs to schedule.  Avoid AV nodal blocking agents Pt had ETT 01/26/2019 with no ischemic response, +PVCs (no NSVT), and good HR response from 78 to 130 bpm   2. HTN Stable on current regimen  3. Suspected sleep apnea Sleep study is pending scheduling. Will follow up.   4. Lightheadedness Has not been correlated with his pauses. Encouraged hydration and following through with his sleep study.   Will follow up in 6 months with EP APP.   Shirley Friar, PA-C  02/09/19 12:51 PM

## 2019-02-09 ENCOUNTER — Ambulatory Visit (INDEPENDENT_AMBULATORY_CARE_PROVIDER_SITE_OTHER): Payer: Medicare Other | Admitting: Student

## 2019-02-09 ENCOUNTER — Other Ambulatory Visit: Payer: Self-pay

## 2019-02-09 VITALS — BP 144/90 | HR 64 | Ht 64.0 in | Wt 203.0 lb

## 2019-02-09 DIAGNOSIS — R001 Bradycardia, unspecified: Secondary | ICD-10-CM

## 2019-02-09 NOTE — Patient Instructions (Signed)
Medication Instructions:  Your physician recommends that you continue on your current medications as directed. Please refer to the Current Medication list given to you today.  If you need a refill on your cardiac medications before your next appointment, please call your pharmacy.   Lab work: NONE ORDERED  TODAY  If you have labs (blood work) drawn today and your tests are completely normal, you will receive your results only by: Marland Kitchen MyChart Message (if you have MyChart) OR . A paper copy in the mail If you have any lab test that is abnormal or we need to change your treatment, we will call you to review the results.  Testing/Procedures: NONE ORDERED  TODAY   Follow-Up: At [facility], you and your health needs are our priority.  As part of our continuing mission to provide you with exceptional heart care, we have created designated Provider Care Teams.  These Care Teams include your primary Cardiologist (physician) and Advanced Practice Providers (APPs -  Physician Assistants and Nurse Practitioners) who all work together to provide you with the care you need, when you need it.Your physician wants you to follow-up in:  [place] will receive a reminder letter in the mail two months in advance. If you don't receive a letter, please call our office to schedule the follow-up appointment.   Any Other Special Instructions Will Be Listed Below (If Applicable).

## 2019-02-13 NOTE — Progress Notes (Signed)
Cardiology Office Note   Date:  02/14/2019   ID:  Todd Ferguson, DOB 1940/12/12, MRN [id number]  PCP:  Antony Blackbird, MD  Cardiologist:  Dr. Percival Spanish  CC: Posthospitalization follow-up   History of Present Illness: CHAOS POKE is a 78 y.o. male who presents for ongoing assessment and management of hypertension, h/o bradycardia, not on AV nodal blocking agents. Negative GXT without ischemic response, with no evidence of chronotropic incompetence.   Seen recently during recent hospitalization by Dr. Johnsie Cancel for hypotension and bradycardia. He was admitted for AMS. Overnight during observation, he was found to have sinus pause lasting greater than 3 seconds, followed by pauses of 2 seconds. Amlodipine and HCTZ was discontinued. He was also recommended for a sleep study.  The patient comes today stating that he feels okay except that he has episodes of "feeling like I am going to fade out" which occurs during waking hours, sometimes while he is walking sometimes while he is sitting at home in a chair.  He has never passed out but is having some presyncopal episodes.  He denies any dizziness.  He states that he just waits for it to pass.  He has never fallen.  Blood pressure is slightly elevated today off of amlodipine and HCTZ.  Otherwise the patient denies any chest pain or dyspnea associated with "fading out spells" or with any activity.  Past Medical History:  Diagnosis Date  . Bradycardia   . 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  . Hypertension   . Tobacco abuse     Past Surgical History:  Procedure Laterality Date  . APPENDECTOMY    . DENTAL SURGERY    . Removed right eye       Current Outpatient Medications  Medication Sig Dispense Refill  . aspirin EC 81 MG tablet Take 1 tablet (81 mg total) by mouth daily. 30 tablet 6  . lisinopril (PRINIVIL,ZESTRIL) 40 MG tablet Take 1 tablet (40 mg total) by mouth daily. 90  tablet 1   No current facility-administered medications for this visit.     Allergies:   Patient has no known allergies.    Social History:  The patient  reports that he has been smoking cigarettes. He has been smoking about 0.25 packs per day. He has never used smokeless tobacco. He reports current alcohol use. He reports current drug use. Drug: Marijuana.   Family History:  The patient's family history includes Diabetes in his brother; Heart disease in his mother; Kidney disease in his mother.    ROS: All other systems are reviewed and negative. Unless otherwise mentioned in H&P    PHYSICAL EXAM: VS:  BP (!) 142/80   Pulse 80   Ht 5\' 4"  (1.626 m)   Wt 203 lb 6.4 oz (92.3 kg)   BMI 34.91 kg/m  , BMI Body mass index is 34.91 kg/m. GEN: Well nourished, well developed, in no acute distress HEENT: Right eye deviated to the right unfocused, left eye normal Neck: no JVD, carotid bruits, or masses Cardiac: IRRR; 1/6 systolic murmurs, rubs, or gallops,no edema  Respiratory:  Clear to auscultation bilaterally, normal work of breathing GI: soft, nontender, nondistended, + BS MS: no deformity or atrophy Skin: warm and dry, no rash Neuro:  Strength and sensation are intact Psych: euthymic mood, full affect   EKG: Sinus rhythm with PVCs.  Heart rate of 80 bpm.  Recent Labs: 01/23/2019:  Magnesium 2.0; TSH 0.794 01/24/2019: ALT 20; BUN 13; Creatinine, Ser 1.06; Potassium 4.8; Sodium 137 01/26/2019: Hemoglobin 11.9; Platelets 487    Lipid Panel    Component Value Date/Time   CHOL 194 03/04/2018 1038   TRIG 83 03/04/2018 1038   HDL 52 03/04/2018 1038   CHOLHDL 3.7 03/04/2018 1038   CHOLHDL 3.4 07/18/2016 0335   VLDL 12 07/18/2016 0335   LDLCALC 125 (H) 03/04/2018 1038      Wt Readings from Last 3 Encounters:  02/14/19 203 lb 6.4 oz (92.3 kg)  02/09/19 203 lb (92.1 kg)  01/26/19 208 lb 12.8 oz (94.7 kg)      Other studies Reviewed: GXT-01/26/2019 Study Highlights     There was no ST segment deviation noted during stress.  No T wave inversion was noted during stress.   No ischemic response Frequent PVC;s but no NSVT No chronotropic incompetence with HR increasing From 78 to 130 bpm which was 89% PMHR   Echocardiogram 01/24/2019  1. The left ventricle has normal systolic function with an ejection fraction of 60-65%. The cavity size was normal. Left ventricular diastolic function could not be evaluated secondary to atrial fibrillation.  2. The right ventricle has normal systolic function. The cavity was normal. There is no increase in right ventricular wall thickness. Right ventricular systolic pressure could not be assessed.  3. Mild calcification of the anterior mitral valve leaflet.  4. The aortic valve is tricuspid. Moderate calcification of the aortic valve. Aortic valve regurgitation was not assessed by color flow Doppler. Mild stenosis of the aortic valve.  5. The aorta is normal unless otherwise noted.  6. The inferior vena cava was dilated in size with >50% respiratory variability.  ASSESSMENT AND PLAN:  1.  Bradycardia with pauses: Noted during recent hospitalization.  The patient was taken off of amlodipine and HCTZ, but remains on lisinopril.  He had documented pauses of 2seconds to 3 seconds.  The patient continues to have episodes where he is "fading out" but does not fully lose consciousness.  EKG reveals sinus rhythm with sinus arrhythmias and PVCs.  I am going to place a cardiac monitor on him to evaluate his heart rhythm for additional pauses or arrhythmias to assess what is going on when he has these "spells."  He is willing to wear the monitor for further evaluation.  I will not make any changes in his medication regimen at this time.  2.  Hypertension: Slightly elevated in the office today with discontinuation of amlodipine and HCTZ, will follow-up on next office visit if remains elevated may adjust lisinopril dose.  Most recent creatinine on  01/24/2019 was 1.06.  Potassium 4.8.  Sodium 137.   Current medicines are reviewed at length with the patient today.    Labs/ tests ordered today include: 14-day cardiac monitor.  Phill Myron. West Pugh, ANP, AACC   02/14/2019 4:02 PM    [facility] [place] [address] Office [phone] Fax [phone]

## 2019-02-14 ENCOUNTER — Other Ambulatory Visit: Payer: Self-pay

## 2019-02-14 ENCOUNTER — Encounter: Payer: Self-pay | Admitting: Adult Health

## 2019-02-14 ENCOUNTER — Ambulatory Visit (INDEPENDENT_AMBULATORY_CARE_PROVIDER_SITE_OTHER): Payer: Medicare Other | Admitting: Adult Health

## 2019-02-14 VITALS — BP 142/80 | HR 80 | Ht 64.0 in | Wt 203.4 lb

## 2019-02-14 DIAGNOSIS — I10 Essential (primary) hypertension: Secondary | ICD-10-CM | POA: Diagnosis not present

## 2019-02-14 DIAGNOSIS — R55 Syncope and collapse: Secondary | ICD-10-CM

## 2019-02-14 DIAGNOSIS — R001 Bradycardia, unspecified: Secondary | ICD-10-CM

## 2019-02-14 NOTE — Patient Instructions (Signed)
Medication Instructions:  Continue current medications  If you need a refill on your cardiac medications before your next appointment, please call your pharmacy.  Labwork: None Ordered   Testing/Procedures: Your physician has recommended that you wear a 2 weeks Zio monitor. Holter monitors are medical devices that record the heart's electrical activity. Doctors most often use these monitors to diagnose arrhythmias. Arrhythmias are problems with the speed or rhythm of the heartbeat. The monitor is a small, portable device. You can wear one while you do your normal daily activities. This is usually used to diagnose what is causing palpitations/syncope (passing out).  Follow-Up: . Your physician recommends that you schedule a follow-up appointment in: 6 weeks   At [facility], you and your health needs are our priority.  As part of our continuing mission to provide you with exceptional heart care, we have created designated Provider Care Teams.  These Care Teams include your primary Cardiologist (physician) and Advanced Practice Providers (APPs -  Physician Assistants and Nurse Practitioners) who all work together to provide you with the care you need, when you need it.  Thank you for choosing [facility] at [facility]!!

## 2019-02-17 ENCOUNTER — Telehealth: Payer: Self-pay | Admitting: *Deleted

## 2019-02-17 NOTE — Telephone Encounter (Signed)
14 day ZIO XT long term holter monitor to be mailed to the patients home.  Instructions reviewed briefly as they are included in the monitor kit. 

## 2019-02-21 ENCOUNTER — Emergency Department (HOSPITAL_COMMUNITY)
Admission: EM | Admit: 2019-02-21 | Discharge: 2019-02-22 | Disposition: A | Discharge status: Home / Self Care | Payer: Medicare Other | Attending: Emergency Medicine | Admitting: Emergency Medicine

## 2019-02-21 DIAGNOSIS — T5991XA Toxic effect of unspecified gases, fumes and vapors, accidental (unintentional), initial encounter: Secondary | ICD-10-CM | POA: Diagnosis not present

## 2019-02-21 DIAGNOSIS — R030 Elevated blood-pressure reading, without diagnosis of hypertension: Secondary | ICD-10-CM | POA: Insufficient documentation

## 2019-02-21 DIAGNOSIS — Z79899 Other long term (current) drug therapy: Secondary | ICD-10-CM | POA: Diagnosis not present

## 2019-02-21 DIAGNOSIS — Z7982 Long term (current) use of aspirin: Secondary | ICD-10-CM | POA: Insufficient documentation

## 2019-02-21 DIAGNOSIS — I10 Essential (primary) hypertension: Secondary | ICD-10-CM

## 2019-02-21 DIAGNOSIS — R0602 Shortness of breath: Secondary | ICD-10-CM | POA: Diagnosis present

## 2019-02-21 DIAGNOSIS — F17210 Nicotine dependence, cigarettes, uncomplicated: Secondary | ICD-10-CM | POA: Diagnosis not present

## 2019-02-21 NOTE — ED Triage Notes (Addendum)
BIB [facility] ems from home. Pt states he mixed ammonia and bleach for house cleaning and became sob due to fumes. Per ems, pt was found lying on floor; alert and oriented on scene, stating he felt better laying down. Pt was placed on 2LPM by EMS; pt denies regular oxygen use at home. Pt has cardiac hx, HTN, blind in right eye. Pt is alert and oriented at time of triage.

## 2019-02-22 NOTE — ED Provider Notes (Signed)
[place] EMERGENCY DEPARTMENT Provider Note   CSN: [id number] Arrival date & time: 02/21/19  1734    History   Chief Complaint Chief Complaint  Patient presents with  . Shortness of Breath    HPI Todd Ferguson is a 78 y.o. male.   The history is provided by the patient.  Shortness of Breath He has history of hypertension, hyperlipidemia, pre-diabetes and comes in following toxic fume exposure at home.  He mixed bleach and ammonia to do some cleaning.  He noted some irritation in his nose and throat, and has had a slight cough.  He denies shortness of breath.  Past Medical History:  Diagnosis Date  . Bradycardia   . 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  . Hypertension   . Tobacco abuse     Patient Active Problem List   Diagnosis Date Noted  . Sinus bradycardia 01/24/2019  . Swelling of left hand 07/17/2016  . Healthcare maintenance 02/07/2016  . Gout involving toe of right foot 07/25/2015  . Smoking 08/07/2014  . Prostate cancer screening 08/07/2014  . Colon cancer screening 08/07/2014  . Prediabetes 08/07/2014  . Special screening for malignant neoplasms, colon 01/19/2014  . Encounter for abdominal aortic aneurysm screening 01/19/2014  . Essential hypertension, benign 07/11/2013  . Hypertriglyceridemia 07/11/2013  . Unspecified vitamin D deficiency 07/11/2013    Past Surgical History:  Procedure Laterality Date  . APPENDECTOMY    . DENTAL SURGERY    . Removed right eye          Home Medications    Prior to Admission medications   Medication Sig Start Date End Date Taking? Authorizing Provider  aspirin EC 81 MG tablet Take 1 tablet (81 mg total) by mouth daily. 03/04/18   Fulp, Cammie, MD  lisinopril (PRINIVIL,ZESTRIL) 40 MG tablet Take 1 tablet (40 mg total) by mouth daily. 03/04/18   Antony Blackbird, MD    Family History Family History  Problem Relation Age of Onset   . Diabetes Brother   . Heart disease Mother   . Kidney disease Mother   . Colon cancer Neg Hx   . Esophageal cancer Neg Hx   . Rectal cancer Neg Hx   . Stomach cancer Neg Hx   . Prostate cancer Neg Hx   . Pancreatic cancer Neg Hx     Social History Social History   Tobacco Use  . Smoking status: Current Every Day Smoker    Packs/day: 0.25    Types: Cigarettes  . Smokeless tobacco: Never Used  . Tobacco comment: occassionally   Substance Use Topics  . Alcohol use: Yes    Comment: socially  . Drug use: Yes    Types: Marijuana    Comment: Marijuana about 1 week ago.     Allergies   Patient has no known allergies.   Review of Systems Review of Systems  Respiratory: Positive for shortness of breath.   All other systems reviewed and are negative.    Physical Exam Updated Vital Signs BP (!) 152/75 (BP Location: Right Arm)   Pulse (!) 54   Temp (!) 97.5 F (36.4 C) (Oral)   Resp 16   SpO2 94%   Physical Exam Vitals signs and nursing note reviewed.    78 year old male, resting comfortably and in no acute distress. Vital signs are significant for mild decrease in heart rate and elevated  blood pressure. Oxygen saturation is 94%, which is normal. Head is normocephalic and atraumatic.  Left pupil reacts to light, EOMs are full.  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 Oropharynx is clear - no evidence of mucosal irritation. Neck is nontender and supple without adenopathy or JVD. Back is nontender and there is no CVA tenderness. Lungs are clear without rales, wheezes, or rhonchi. Chest is nontender. Heart has regular rate and rhythm without murmur. Abdomen is soft, flat, nontender without masses or hepatosplenomegaly and peristalsis is normoactive. Extremities have no cyanosis or edema, full range of motion is present. Skin is warm and dry without rash. Neurologic: Mental status is normal, cranial nerves are intact, there are no motor  or sensory deficits.  ED Treatments / Results   EKG EKG Interpretation  Date/Time:  Monday February 21 2019 17:52:44 EDT Ventricular Rate:  41 PR Interval:  144 QRS Duration: 80 QT Interval:  434 QTC Calculation: 358 R Axis:   49 Text Interpretation:  Marked sinus bradycardia Otherwise within normal limits Abnormal ECG When compared with ECG of 01/25/2019, HEART RATE has decreased Confirmed by Delora Fuel ([id number]) on 02/22/2019 12:03:29 AM  Procedures Procedures   Medications Ordered in ED Medications - No data to display   Initial Impression / Assessment and Plan / ED Course  I have reviewed the triage vital signs and the nursing notes.  Exposure to chloramine gas with no evidence of toxicity.  Prior to my seeing him, he had been in the waiting room for 8 hours.  With normal exam and normal oxygen saturation, no indication for further testing.  ECG had been obtained which did show sinus bradycardia, which she is known to have.  He is felt to be safe for discharge.  Advised to monitor his blood pressure at home, and admonished never to mix ammonia and bleach.  Old records are reviewed showing that he has been followed by cardiology because of sinus bradycardia.  Final Clinical Impressions(s) / ED Diagnoses   Final diagnoses:  Toxic effect of fumes, accidental or unintentional, initial encounter  Elevated blood pressure reading with diagnosis of hypertension    ED Discharge Orders    None       Delora Fuel, MD [id number]

## 2019-02-22 NOTE — Discharge Instructions (Addendum)
Do not ever mix bleach and ammonia!  Please monitor your blood pressure at home, and work with your primary care provider to maintain good control of it.

## 2019-02-23 ENCOUNTER — Ambulatory Visit (INDEPENDENT_AMBULATORY_CARE_PROVIDER_SITE_OTHER): Payer: Medicare Other

## 2019-02-23 DIAGNOSIS — R55 Syncope and collapse: Secondary | ICD-10-CM

## 2019-02-23 DIAGNOSIS — R001 Bradycardia, unspecified: Secondary | ICD-10-CM

## 2019-03-07 ENCOUNTER — Inpatient Hospital Stay (HOSPITAL_COMMUNITY): Admission: RE | Admit: 2019-03-07 | Payer: Medicare Other | Source: Ambulatory Visit

## 2019-03-08 ENCOUNTER — Encounter (HOSPITAL_COMMUNITY): Payer: Self-pay | Admitting: Emergency Medicine

## 2019-03-08 ENCOUNTER — Other Ambulatory Visit (HOSPITAL_COMMUNITY)
Admission: RE | Admit: 2019-03-08 | Discharge: 2019-03-08 | Disposition: A | Discharge status: Home / Self Care | Payer: Medicare Other | Source: Ambulatory Visit | Attending: Family Medicine | Admitting: Family Medicine

## 2019-03-08 ENCOUNTER — Emergency Department (HOSPITAL_COMMUNITY): Payer: Medicare Other

## 2019-03-08 DIAGNOSIS — F17210 Nicotine dependence, cigarettes, uncomplicated: Secondary | ICD-10-CM | POA: Insufficient documentation

## 2019-03-08 DIAGNOSIS — Z20828 Contact with and (suspected) exposure to other viral communicable diseases: Secondary | ICD-10-CM | POA: Diagnosis not present

## 2019-03-08 DIAGNOSIS — Z7982 Long term (current) use of aspirin: Secondary | ICD-10-CM | POA: Diagnosis not present

## 2019-03-08 DIAGNOSIS — M25532 Pain in left wrist: Secondary | ICD-10-CM | POA: Insufficient documentation

## 2019-03-08 DIAGNOSIS — I10 Essential (primary) hypertension: Secondary | ICD-10-CM | POA: Diagnosis not present

## 2019-03-08 LAB — SARS CORONAVIRUS 2 (TAT 6-24 HRS): SARS Coronavirus 2: NEGATIVE

## 2019-03-08 NOTE — ED Triage Notes (Signed)
Patient here from home with complaints of left wrist pain and swelling x3 days ago. Hypertensive hx of same. Takes lisinopril.

## 2019-03-09 ENCOUNTER — Emergency Department (HOSPITAL_COMMUNITY)
Admission: EM | Admit: 2019-03-09 | Discharge: 2019-03-09 | Disposition: A | Discharge status: Home / Self Care | Payer: Medicare Other | Attending: Emergency Medicine | Admitting: Emergency Medicine

## 2019-03-09 DIAGNOSIS — I10 Essential (primary) hypertension: Secondary | ICD-10-CM

## 2019-03-09 DIAGNOSIS — M25532 Pain in left wrist: Secondary | ICD-10-CM

## 2019-03-09 MED ORDER — KETOROLAC TROMETHAMINE 15 MG/ML IJ SOLN
15.0000 mg | Freq: Once | INTRAMUSCULAR | Status: AC
  Administered 2019-03-09: 03:00:00 15 mg via INTRAMUSCULAR
  Filled 2019-03-09: qty 1

## 2019-03-09 NOTE — Discharge Instructions (Addendum)
You were seen today for wrist pain.  Take Tylenol or ibuprofen as needed for pain.  This may be related to your gout.  You were notably hypertensive.  Continue your blood pressure medications.  Follow-up with your primary physician for recheck and blood pressure medication adjustment.

## 2019-03-09 NOTE — ED Provider Notes (Signed)
[place] DEPT Provider Note   CSN: [id number] Arrival date & time: 03/08/19  2100     History   Chief Complaint Chief Complaint  Patient presents with  . Hypertension  . Wrist Pain    HPI Todd Ferguson is a 78 y.o. male.     HPI  This is a 78 year old male with a history of hypertension, gout who presents with left wrist pain and swelling.  Patient reports several day history of left wrist pain and swelling.  He thinks this may be related to his gout.  Currently he states that his pain is "not bad."  It is worse with some range of motion of the wrist.  He reports taking Tylenol with some relief.  No fevers or redness.  Patient notably hypertensive in triage.  Reports that he is taking his lisinopril daily and does not have any chest pain, headache, shortness of breath.   Past Medical History:  Diagnosis Date  . Bradycardia   . 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  . Hypertension   . Tobacco abuse     Patient Active Problem List   Diagnosis Date Noted  . Sinus bradycardia 01/24/2019  . Swelling of left hand 07/17/2016  . Healthcare maintenance 02/07/2016  . Gout involving toe of right foot 07/25/2015  . Smoking 08/07/2014  . Prostate cancer screening 08/07/2014  . Colon cancer screening 08/07/2014  . Prediabetes 08/07/2014  . Special screening for malignant neoplasms, colon 01/19/2014  . Encounter for abdominal aortic aneurysm screening 01/19/2014  . Essential hypertension, benign 07/11/2013  . Hypertriglyceridemia 07/11/2013  . Unspecified vitamin D deficiency 07/11/2013    Past Surgical History:  Procedure Laterality Date  . APPENDECTOMY    . DENTAL SURGERY    . Removed right eye          Home Medications    Prior to Admission medications   Medication Sig Start Date End Date Taking? Authorizing Provider  aspirin EC 81 MG tablet Take 1 tablet (81 mg total)  by mouth daily. 03/04/18  Yes Fulp, Cammie, MD  lisinopril (PRINIVIL,ZESTRIL) 40 MG tablet Take 1 tablet (40 mg total) by mouth daily. 03/04/18  Yes Fulp, Ander Gaster, MD    Family History Family History  Problem Relation Age of Onset  . Diabetes Brother   . Heart disease Mother   . Kidney disease Mother   . Colon cancer Neg Hx   . Esophageal cancer Neg Hx   . Rectal cancer Neg Hx   . Stomach cancer Neg Hx   . Prostate cancer Neg Hx   . Pancreatic cancer Neg Hx     Social History Social History   Tobacco Use  . Smoking status: Current Every Day Smoker    Packs/day: 0.25    Types: Cigarettes  . Smokeless tobacco: Never Used  . Tobacco comment: occassionally   Substance Use Topics  . Alcohol use: Yes    Comment: socially  . Drug use: Yes    Types: Marijuana    Comment: Marijuana about 1 week ago.     Allergies   Patient has no known allergies.   Review of Systems Review of Systems  Constitutional: Negative for fever.  Respiratory: Negative for shortness of breath.   Cardiovascular: Negative for chest pain.  Musculoskeletal:       Left wrist pain  Neurological: Negative for headaches.  All other systems  reviewed and are negative.    Physical Exam Updated Vital Signs BP (!) 211/105 (BP Location: Left Arm)   Pulse 70   Temp 98.2 F (36.8 C) (Oral)   Resp 17   SpO2 97%   Physical Exam Vitals signs and nursing note reviewed.  Constitutional:      Appearance: He is well-developed. He is not ill-appearing.  HENT:     Head: Normocephalic and atraumatic.  Eyes:     Pupils: Pupils are equal, round, and reactive to light.  Neck:     Musculoskeletal: Neck supple.  Cardiovascular:     Rate and Rhythm: Normal rate and regular rhythm.     Heart sounds: Normal heart sounds. No murmur.     Comments: Heart monitor in place Pulmonary:     Effort: Pulmonary effort is normal. No respiratory distress.     Breath sounds: Normal breath sounds. No wheezing.  Abdominal:      Palpations: Abdomen is soft.     Tenderness: There is no abdominal tenderness.  Musculoskeletal:     Comments: Focused examination of the left wrist with slight swelling noted over the dorsum of the left wrist, no overlying skin changes, normal range of motion of the wrist, no warmth or erythema, neurovascular intact distally  Skin:    General: Skin is warm and dry.  Neurological:     Mental Status: He is alert and oriented to person, place, and time.  Psychiatric:        Mood and Affect: Mood normal.      ED Treatments / Results  Labs (all labs ordered are listed, but only abnormal results are displayed) Labs Reviewed - No data to display  EKG None  Radiology Dg Wrist Complete Left  Result Date: 03/08/2019 CLINICAL DATA:  Swelling EXAM: LEFT WRIST - COMPLETE 3+ VIEW COMPARISON:  None. FINDINGS: No fracture or malalignment. Widened appearing scapholunate interval up to 6 mm. Joint spaces appear maintained. Possible vascular calcifications volar wrist. Diffuse soft tissue swelling IMPRESSION: 1. No acute osseous abnormality 2. Widening at the scapholunate interval suggesting possible ligamentous injury Electronically Signed   By: Donavan Foil M.D.   On: 03/08/2019 21:53    Procedures Procedures (including critical care time)  Medications Ordered in ED Medications  ketorolac (TORADOL) 15 MG/ML injection 15 mg (has no administration in time range)     Initial Impression / Assessment and Plan / ED Course  I have reviewed the triage vital signs and the nursing notes.  Pertinent labs & imaging results that were available during my care of the patient were reviewed by me and considered in my medical decision making (see chart for details).        Patient presents with left wrist pain.  Also notably hypertensive in triage.  He is otherwise nontoxic-appearing and vital signs are otherwise reassuring.  Regarding his left wrist pain, he has slight swelling on exam but no  warmth or erythema and normal range of motion.  Low suspicion for septic arthritis.  Denies injury.  X-rays are negative.  There is slight widening suggestive of ligamentous injury but patient denies any trauma.  This could be gout.  He is neurovascular intact.  Currently his pain is well controlled.  I have reviewed his prior lab work and his creatinine is normal.  Recommend ibuprofen and Tylenol at home.  Regarding his blood pressure, he is otherwise asymptomatic.  Recent hospitalization for bradycardia and he was taken off his amlodipine.  On my evaluation  his blood pressure is 123XX123 systolic which is improved from 211.  Given that he is asymptomatic, do not feel I need to intervene emergently.  I do think he needs to follow-up closely with his primary doctor for blood pressure recheck and medication adjustment.  Final Clinical Impressions(s) / ED Diagnoses   Final diagnoses:  Left wrist pain  Essential hypertension    ED Discharge Orders    None       Merryl Hacker, MD 03/09/19 [phone]

## 2019-03-10 ENCOUNTER — Ambulatory Visit (HOSPITAL_BASED_OUTPATIENT_CLINIC_OR_DEPARTMENT_OTHER): Payer: Medicare Other | Admitting: Internal Medicine

## 2019-03-11 ENCOUNTER — Other Ambulatory Visit (HOSPITAL_COMMUNITY)
Admission: RE | Admit: 2019-03-11 | Discharge: 2019-03-11 | Disposition: A | Discharge status: Home / Self Care | Payer: Medicare Other | Source: Ambulatory Visit | Attending: Internal Medicine | Admitting: Internal Medicine

## 2019-03-11 DIAGNOSIS — Z20828 Contact with and (suspected) exposure to other viral communicable diseases: Secondary | ICD-10-CM | POA: Diagnosis not present

## 2019-03-11 DIAGNOSIS — Z01812 Encounter for preprocedural laboratory examination: Secondary | ICD-10-CM | POA: Insufficient documentation

## 2019-03-12 ENCOUNTER — Other Ambulatory Visit: Payer: Self-pay

## 2019-03-12 ENCOUNTER — Ambulatory Visit (HOSPITAL_BASED_OUTPATIENT_CLINIC_OR_DEPARTMENT_OTHER): Payer: Medicare Other | Attending: Family Medicine | Admitting: Internal Medicine

## 2019-03-12 VITALS — Ht 64.0 in | Wt 203.0 lb

## 2019-03-12 DIAGNOSIS — R001 Bradycardia, unspecified: Secondary | ICD-10-CM | POA: Insufficient documentation

## 2019-03-12 DIAGNOSIS — R0683 Snoring: Secondary | ICD-10-CM

## 2019-03-12 LAB — NOVEL CORONAVIRUS, NAA (HOSP ORDER, SEND-OUT TO REF LAB; TAT 18-24 HRS): SARS-CoV-2, NAA: NOT DETECTED

## 2019-03-19 DIAGNOSIS — R001 Bradycardia, unspecified: Secondary | ICD-10-CM | POA: Diagnosis not present

## 2019-03-19 NOTE — Procedures (Signed)
     Patient Name: Todd Ferguson, Todd Ferguson Date: 03/12/2019 Gender: Male D.O.B: July 13, 1940 Age (years): 75 Referring Provider: Cammie Fulp Height (inches): 64 Interpreting Physician: Baird Lyons MD, ABSM Weight (lbs): 203 RPSGT: Earney Hamburg BMI: 35 MRN: [id number] Neck Size: 17.00 <br> <br> CLINICAL INFORMATION Sleep Study Type: NPSG Indication for sleep study: OSA with Insomnia Epworth Sleepiness Score: 9  SLEEP STUDY TECHNIQUE As per the AASM Manual for the Scoring of Sleep and Associated Events v2.3 (April 2016) with a hypopnea requiring 4% desaturations.  The channels recorded and monitored were frontal, central and occipital EEG, electrooculogram (EOG), submentalis EMG (chin), nasal and oral airflow, thoracic and abdominal wall motion, anterior tibialis EMG, snore microphone, electrocardiogram, and pulse oximetry.  MEDICATIONS Medications self-administered by patient taken the night of the study : none reported  SLEEP ARCHITECTURE The study was initiated at 9:46:21 PM and ended at 4:50:37 AM.  Sleep onset time was 37.9 minutes and the sleep efficiency was 46.9%%. The total sleep time was 199 minutes.  Stage REM latency was 70.0 minutes.  The patient spent 2.3%% of the night in stage N1 sleep, 73.1%% in stage N2 sleep, 0.0%% in stage N3 and 24.6% in REM.  Alpha intrusion was absent.  Supine sleep was 0.28%.  RESPIRATORY PARAMETERS The overall apnea/hypopnea index (AHI) was 2.7 per hour. There were 6 total apneas, including 4 obstructive, 2 central and 0 mixed apneas. There were 3 hypopneas and 8 RERAs.  The AHI during Stage REM sleep was 9.8 per hour.  AHI while supine was 0.0 per hour.  The mean oxygen saturation was 94.3%. The minimum SpO2 during sleep was 90.0%.  loud snoring was noted during this study.  CARDIAC DATA The 2 lead EKG demonstrated sinus rhythm. The mean heart rate was 58.8 beats per minute. Other EKG findings include: None.  LEG  MOVEMENT DATA The total PLMS were 0 with a resulting PLMS index of 0.0. Associated arousal with leg movement index was 0.3 .  IMPRESSIONS - No significant obstructive sleep apnea occurred during this study (AHI = 2.7/h). - No significant central sleep apnea occurred during this study (CAI = 0.6/h). - The patient had minimal or no oxygen desaturation during the study (Min O2 = 90.0%) - The patient snored with loud snoring volume. - No cardiac abnormalities were noted during this study. - Clinically significant periodic limb movements did not occur during sleep. No significant associated arousals. - Difficulty maintaining sleep. Patient woke for restroomat 1:15 AM and didnot regainsleep until 3:50 AM.  DIAGNOSIS - Primary Snoring - Insomnia  RECOMMENDATIONS - Manage for snoring and for Insomnia based on clinical judgment.. - Sleep hygiene should be reviewed to assess factors that may improve sleep quality. - Weight management and regular exercise should be initiated or continued if appropriate.  [Electronically signed] 03/19/2019 11:10 AM  Baird Lyons MD, ABSM Diplomate, American Board of Sleep Medicine   NPI: [id number]                        [facility]  ELECTRONICALLY SIGNED ON:  03/19/2019, 11:07 AM [place] PH: [phone]   FX: [phone] [place]

## 2019-03-21 ENCOUNTER — Telehealth: Payer: Self-pay | Admitting: Internal Medicine

## 2019-03-21 NOTE — Telephone Encounter (Signed)
Patient wife called stating they received a missed call for the patient sleep study test. Please follow up.

## 2019-03-21 NOTE — Telephone Encounter (Signed)
Informed patient with results and he verbalized understanding.  

## 2019-03-24 NOTE — Progress Notes (Signed)
Cardiology Office Note   Date:  03/28/2019   ID:  Todd Ferguson, DOB December 07, 1940, MRN [id number]  PCP:  Jilda Panda, MD  Cardiologist:  Hochrein  No chief complaint on file.    History of Present Illness: Todd Ferguson is a 78 y.o. male who presents for ongoing assessment and management of hypertension, h/o bradycardia, not on AV nodal blocking agents. Negative GXT without ischemic response, with no evidence of chronotropic incompetence.   On last office visit on 02/14/2019 he reported symptoms of episodes of "feeling like I am going to fade out" which occurs during waking hours, sometimes while he is walking sometimes while he is sitting at home in a chair.  He has never passed out but is having some presyncopal episodes. I placed a cardiac monitor on the patient to evaluate for bradycardia or pauses, as during hospitalization in September, he was noted to have brief pauses.   He was seen in the ED on 02/22/2019 for dyspnea He has been exposed to ammonia and bleach, but was not found to be toxic.  He does not appear to have had cardiac monitor results read at this time.   He is here today without any complaints.  He continues to wear the cardiac monitor and did not turn it in.  He has been medically compliant.  Past Medical History:  Diagnosis Date  . Bradycardia   . 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  . Hypertension   . Tobacco abuse     Past Surgical History:  Procedure Laterality Date  . APPENDECTOMY    . DENTAL SURGERY    . Removed right eye       Current Outpatient Medications  Medication Sig Dispense Refill  . aspirin EC 81 MG tablet Take 1 tablet (81 mg total) by mouth daily. 30 tablet 6  . lisinopril (ZESTRIL) 40 MG tablet Take 1 tablet (40 mg total) by mouth daily. 90 tablet 3  . hydrochlorothiazide (MICROZIDE) 12.5 MG capsule Take 1 capsule (12.5 mg total) by mouth daily. 90 capsule 3   No current  facility-administered medications for this visit.     Allergies:   Patient has no known allergies.    Social History:  The patient  reports that he has been smoking cigarettes. He has been smoking about 0.25 packs per day. He has never used smokeless tobacco. He reports current alcohol use. He reports current drug use. Drug: Marijuana.   Family History:  The patient's family history includes Diabetes in his brother; Heart disease in his mother; Kidney disease in his mother.    ROS: All other systems are reviewed and negative. Unless otherwise mentioned in H&P    PHYSICAL EXAM: VS:  BP (!) 168/90   Pulse 68   Temp 97.6 F (36.4 C)   Ht 5\' 4"  (1.626 m)   Wt 206 lb 9.6 oz (93.7 kg)   SpO2 96%   BMI 35.46 kg/m  , BMI Body mass index is 35.46 kg/m. GEN: Well nourished, well developed, in no acute distress HEENT: normal Neck: no JVD, carotid bruits, or masses Cardiac: RRR; bradycardic,  no murmurs, rubs, or gallops,no edema  Respiratory:  Clear to auscultation bilaterally, normal work of breathing GI: soft, nontender, nondistended, + BS MS: no deformity or atrophy Skin: warm and dry, no rash Neuro:  Strength and sensation are intact Psych: euthymic mood, full affect   EKG:  Not completed this office visit.   Recent Labs: 01/23/2019: Magnesium 2.0; TSH 0.794 20-Feb-2019: ALT 20; BUN 13; Creatinine, Ser 1.06; Potassium 4.8; Sodium 137 01/26/2019: Hemoglobin 11.9; Platelets 487    Lipid Panel    Component Value Date/Time   CHOL 194 03/04/2018 1038   TRIG 83 03/04/2018 1038   HDL 52 03/04/2018 1038   CHOLHDL 3.7 03/04/2018 1038   CHOLHDL 3.4 07/18/2016 0335   VLDL 12 07/18/2016 0335   LDLCALC 125 (H) 03/04/2018 1038      Wt Readings from Last 3 Encounters:  03/28/19 206 lb 9.6 oz (93.7 kg)  03/12/19 203 lb (92.1 kg)  02/14/19 203 lb 6.4 oz (92.3 kg)      Other studies Reviewed: Echocardiogram 2019-02-20 1. The left ventricle has normal systolic function with an  ejection fraction of 60-65%. The cavity size was normal. Left ventricular diastolic function could not be evaluated secondary to atrial fibrillation.  2. The right ventricle has normal systolic function. The cavity was normal. There is no increase in right ventricular wall thickness. Right ventricular systolic pressure could not be assessed.  3. Mild calcification of the anterior mitral valve leaflet.  4. The aortic valve is tricuspid. Moderate calcification of the aortic valve. Aortic valve regurgitation was not assessed by color flow Doppler. Mild stenosis of the aortic valve.  5. The aorta is normal unless otherwise noted.  6. The inferior vena cava was dilated in size with >50% respiratory variability.  ASSESSMENT AND PLAN:  1. Bradycardia:  No further symptoms of "fading out." He will turn in the cardiac monitor today. Will follow up once this has been resulted.   2. Hypertension: He is counseled on low sodium diet. I will add HCTZ 12.5 mg daily to his regimen. Last creatinine 1.06.   Current medicines are reviewed at length with the patient today.    Labs/ tests ordered today include: BMET  Phill Myron. West Pugh, ANP, AACC   03/28/2019 1:39 PM    [facility] [address] Office [phone] Fax [phone]  Notice: This dictation was prepared with Dragon dictation along with smaller phrase technology. Any transcriptional errors that result from this process are unintentional and may not be corrected upon review.

## 2019-03-28 ENCOUNTER — Other Ambulatory Visit: Payer: Self-pay

## 2019-03-28 ENCOUNTER — Ambulatory Visit: Payer: Medicare Other | Admitting: Adult Health

## 2019-03-28 ENCOUNTER — Encounter: Payer: Self-pay | Admitting: Adult Health

## 2019-03-28 VITALS — BP 168/90 | HR 68 | Temp 97.6°F | Ht 64.0 in | Wt 206.6 lb

## 2019-03-28 DIAGNOSIS — R001 Bradycardia, unspecified: Secondary | ICD-10-CM

## 2019-03-28 DIAGNOSIS — I10 Essential (primary) hypertension: Secondary | ICD-10-CM | POA: Diagnosis not present

## 2019-03-28 MED ORDER — HYDROCHLOROTHIAZIDE 12.5 MG PO CAPS: 12.5000 mg | ORAL_CAPSULE | Freq: Every day | ORAL | 3 refills | Status: DC

## 2019-03-28 MED ORDER — LISINOPRIL 40 MG PO TABS: 40.0000 mg | ORAL_TABLET | Freq: Every day | ORAL | 3 refills | Status: DC

## 2019-03-28 MED FILL — HYDROCHLOROTHIAZIDE 12.5 MG: 12.5 | 90 days supply | Qty: 90 | Fill #0

## 2019-03-28 NOTE — Patient Instructions (Addendum)
Medication Instructions:  START- Hydrochlorothiazide 12.5 mg by mouth daily  *If you need a refill on your cardiac medications before your next appointment, please call your pharmacy*  Lab Work: BMP in 1 Week  If you have labs (blood work) drawn today and your tests are completely normal, you will receive your results only by: Marland Kitchen MyChart Message (if you have MyChart) OR . A paper copy in the mail If you have any lab test that is abnormal or we need to change your treatment, we will call you to review the results.  Testing/Procedures: None Ordered  Follow-Up: At [facility], you and your health needs are our priority.  As part of our continuing mission to provide you with exceptional heart care, we have created designated Provider Care Teams.  These Care Teams include your primary Cardiologist (physician) and Advanced Practice Providers (APPs -  Physician Assistants and Nurse Practitioners) who all work together to provide you with the care you need, when you need it.  Your next appointment:   Wednesday December 9th 2:45pm  The format for your next appointment:   In Person  Provider:   Jory Sims, DNP, ANP

## 2019-04-04 LAB — BASIC METABOLIC PANEL
BUN/Creatinine Ratio: 11 (ref 10–24)
BUN: 12 mg/dL (ref 8–27)
CO2: 21 mmol/L (ref 20–29)
Calcium: 9.6 mg/dL (ref 8.6–10.2)
Chloride: 103 mmol/L (ref 96–106)
Creatinine, Ser: 1.12 mg/dL (ref 0.76–1.27)
GFR calc Af Amer: 73 mL/min/{1.73_m2} (ref >59)
GFR calc non Af Amer: 63 mL/min/{1.73_m2} (ref >59)
Glucose: 95 mg/dL (ref 65–99)
Potassium: 4.9 mmol/L (ref 3.5–5.2)
Sodium: 140 mmol/L (ref 134–144)

## 2019-04-11 ENCOUNTER — Telehealth: Payer: Self-pay | Admitting: *Deleted

## 2019-04-11 DIAGNOSIS — R001 Bradycardia, unspecified: Secondary | ICD-10-CM

## 2019-04-11 NOTE — Telephone Encounter (Signed)
-----   Message from Lendon Colonel, NP sent at 04/10/2019 10:07 AM EST ----- This patient will need to be referred to EP for possible pacemaker.  I believe his symptoms warrant a review, especially based on the monitor report.  Will need t call him to let him know. I do not want him to drive until he is seen by EP.

## 2019-04-11 NOTE — Telephone Encounter (Signed)
Pt aware of his monitor, referral send to EP

## 2019-04-13 ENCOUNTER — Encounter: Payer: Self-pay | Admitting: Internal Medicine

## 2019-04-13 ENCOUNTER — Ambulatory Visit (INDEPENDENT_AMBULATORY_CARE_PROVIDER_SITE_OTHER): Payer: Medicare Other | Admitting: Internal Medicine

## 2019-04-13 ENCOUNTER — Other Ambulatory Visit: Payer: Self-pay

## 2019-04-13 VITALS — BP 120/76 | HR 65 | Ht 64.0 in | Wt 205.2 lb

## 2019-04-13 DIAGNOSIS — R001 Bradycardia, unspecified: Secondary | ICD-10-CM | POA: Diagnosis not present

## 2019-04-13 DIAGNOSIS — I10 Essential (primary) hypertension: Secondary | ICD-10-CM | POA: Diagnosis not present

## 2019-04-13 NOTE — Progress Notes (Signed)
ELECTROPHYSIOLOGY CONSULT NOTE  Patient ID: Todd Ferguson, MRN: [id number], DOB/AGE: 1941-02-25 78 y.o. Admit date: (Not on file) Date of Consult: 04/13/2019  Primary Physician: Jilda Panda, MD Primary Cardiologist: Reola Calkins     Todd Ferguson is a 78 y.o. male who is being seen today for the evaluation of bradycardia  at the request of Mazie NP.    HPI Todd Ferguson is a 78 y.o. male referred because of monitor demonstrating tachycardia as well as bradycardia He had been admitted 9/20 with "symptomatic bradycardia "and evidence of sinus pauses and junctional rhythm.  He was also hypotensive and his amlodipine was discontinued.  The patient denies symptoms of lightheadedness.  He denies symptoms of exercise intolerance fatigue shortness of breath or chest discomfort.  He has no palpitations.  He has sleep disordered breathing and underwent a sleep study.  This was recently reported as negative.  Past Medical History:  Diagnosis Date  . Bradycardia   . 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  . Hypertension   . Tobacco abuse       Surgical History:  Past Surgical History:  Procedure Laterality Date  . APPENDECTOMY    . DENTAL SURGERY    . Removed right eye       Home Meds: Current Meds  Medication Sig  . aspirin EC 81 MG tablet Take 1 tablet (81 mg total) by mouth daily.  . hydrochlorothiazide (MICROZIDE) 12.5 MG capsule Take 1 capsule (12.5 mg total) by mouth daily.  Marland Kitchen lisinopril (ZESTRIL) 40 MG tablet Take 1 tablet (40 mg total) by mouth daily.    Allergies: No Known Allergies  Social History   Socioeconomic History  . Marital status: Married    Spouse name: Not on file  . Number of children: Not on file  . Years of education: Not on file  . Highest education level: Not on file  Occupational History  . Not on file  Social Needs  . Financial resource strain: Not on file  . Food insecurity     Worry: Not on file    Inability: Not on file  . Transportation needs    Medical: Not on file    Non-medical: Not on file  Tobacco Use  . Smoking status: Current Every Day Smoker    Packs/day: 0.25    Types: Cigarettes  . Smokeless tobacco: Never Used  . Tobacco comment: occassionally   Substance and Sexual Activity  . Alcohol use: Yes    Comment: socially  . Drug use: Yes    Types: Marijuana    Comment: Marijuana about 1 week ago.  Marland Kitchen Sexual activity: Not on file  Lifestyle  . Physical activity    Days per week: Not on file    Minutes per session: Not on file  . Stress: Not on file  Relationships  . Social Herbalist on phone: Not on file    Gets together: Not on file    Attends religious service: Not on file    Active member of club or organization: Not on file    Attends meetings of clubs or organizations: Not on file    Relationship status: Not on file  . Intimate partner violence    Fear of current or ex partner: Not on file    Emotionally abused: Not on file    Physically abused: Not on  file    Forced sexual activity: Not on file  Other Topics Concern  . Not on file  Social History Narrative  . Not on file     Family History  Problem Relation Age of Onset  . Diabetes Brother   . Heart disease Mother   . Kidney disease Mother   . Colon cancer Neg Hx   . Esophageal cancer Neg Hx   . Rectal cancer Neg Hx   . Stomach cancer Neg Hx   . Prostate cancer Neg Hx   . Pancreatic cancer Neg Hx      ROS:  Please see the history of present illness.     All other systems reviewed and negative.    Physical Exam: Blood pressure 120/76, pulse 65, height 5\' 4"  (1.626 m), weight 205 lb 3.2 oz (93.1 kg), SpO2 98 %. General: Well developed, well nourished male in no acute distress. Head: Normocephalic  sclera non-icteric, no xanthomas, nares are without discharge. 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 JVD not elevated. Back:without scoliosis kyphosis  Lungs: Clear bilaterally to auscultation without wheezes, rales, or rhonchi. Breathing is unlabored. Heart: RRR with S1 S2. No   murmur . No rubs, or gallops appreciated. Abdomen: Soft, non-tender, non-distended with normoactive bowel sounds. No hepatomegaly. No rebound/guarding. No obvious abdominal masses. Msk:  Strength and tone appear normal for age. Extremities: No clubbing or cyanosis. No edema.  Distal pedal pulses are 2+ and equal bilaterally. Skin: Warm and Dry Neuro: Alert and oriented X 3. CN III-XII intact Grossly normal sensory and motor function . Psych:  Responds to questions appropriately with a normal affect.      Labs: Cardiac Enzymes No results for input(s): CKTOTAL, CKMB, TROPONINI in the last 72 hours. CBC Lab Results  Component Value Date   WBC 11.4 (H) 01/26/2019   HGB 11.9 (L) 01/26/2019   HCT 35.6 (L) 01/26/2019   MCV 89.7 01/26/2019   PLT 487 (H) 01/26/2019   PROTIME: No results for input(s): LABPROT, INR in the last 72 hours. Chemistry No results for input(s): NA, K, CL, CO2, BUN, CREATININE, CALCIUM, PROT, BILITOT, ALKPHOS, ALT, AST, GLUCOSE in the last 168 hours.  Invalid input(s): LABALBU Lipids Lab Results  Component Value Date   CHOL 194 03/04/2018   HDL 52 03/04/2018   LDLCALC 125 (H) 03/04/2018   TRIG 83 03/04/2018   BNP No results found for: PROBNP Thyroid Function Tests: No results for input(s): TSH, T4TOTAL, T3FREE, THYROIDAB in the last 72 hours.  Invalid input(s): FREET3 Miscellaneous No results found for: DDIMER  Radiology/Studies:  No results found.  EKG: ECG from 28 September was personally reviewed demonstrating sinus rhythm at 80 Interval 16/08/37 Interpolated PVCs-frequent  Event recorder also personally reviewed-0.  Duration 14 days.  4% PVCs.  Heart rate excursion 28--123 with an average of 64.  Has some SVT.  This up to 145 bpm.  Also has evidence of  nocturnal bradycardia and pauses of 4-1/2 seconds.  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  Surprisingly it is without associated symptoms.  Hence, there is no indication for pacing.  His tachycardia is also asymptomatic; hence, it does not require any  slowing medications.  In the event that he develops more symptomatic tachycardia or symptomatic bradycardia I have told him that he is almost certainly going to come to pacing and encouraged him not to be slow and following up with [place] were he to develop such symptoms.  Follow-up be followed up by primary cardiology.  We will see him as needed   Virl Axe

## 2019-04-13 NOTE — Patient Instructions (Signed)
Medication Instructions:  Your physician recommends that you continue on your current medications as directed. Please refer to the Current Medication list given to you today.  If you need a refill on your cardiac medications before your next appointment, please call your pharmacy.   Lab work: None Ordered  If you have labs (blood work) drawn today and your tests are completely normal, you will receive your results only by: Marland Kitchen MyChart Message (if you have MyChart) OR . A paper copy in the mail If you have any lab test that is abnormal or we need to change your treatment, we will call you to review the results.  Testing/Procedures: None ordered  Follow-Up: AS NEEDED with Dr. Caryl Comes  Any Other Special Instructions Will Be Listed Below (If Applicable).

## 2019-04-26 NOTE — Progress Notes (Signed)
Cardiology Office Note   Date:  04/27/2019   ID:  Todd THOMSEN, DOB Apr 19, 1941, MRN [id number]  PCP:  Jilda Panda, MD  Cardiologist:  Dr. Percival Spanish  No chief complaint on file.    History of Present Illness: Todd Ferguson is a 78 y.o. male who presents for ongoing assessment and management of hypertension, h/o bradycardia, not on AV nodal blocking agents. Negative GXT without ischemic response, with no evidence of chronotropic incompetence.   When last seen in the office on 03/28/2019 complained of feelings of "going to fade out" which occurred during waking hours.  I placed a cardiac monitor on the patient to evaluate for bradycardia as he was noted to have brief pauses while hospitalized recently for exposure to aerosolized ammonia.  Cardiac monitor revealed: Patient had a min HR of 28 bpm, max HR of 146 bpm, and avg HR of 63 bpm. Predominant underlying rhythm was Sinus Rhythm.    Bradycardia noted frequently.   Runs of sinus bradycardia with occasional sinus pauses. Infrequent ventricular ectopy with paired PVCs Brief runs of junctional rhythm.  He was referred to EP for evaluation for possible pacemaker. He was seen by Dr. Caryl Comes on 04/13/2019. He was found to have sinus node dysfunction but was without symptoms at that evaluation. He was is found to be a candidate for a pacemaker currently. If he began to have symptoms he was to contact us, has Dr.Klein felt he may ultimately need a pacemaker.   He comes today without any complaints.  He is medically compliant.  States he is feeling well.   Past Medical History:  Diagnosis Date  . Bradycardia   . 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  . Hypertension   . Tobacco abuse     Past Surgical History:  Procedure Laterality Date  . APPENDECTOMY    . DENTAL SURGERY    . Removed right eye       Current Outpatient Medications  Medication Sig Dispense Refill  .  aspirin EC 81 MG tablet Take 1 tablet (81 mg total) by mouth daily. 30 tablet 6  . hydrochlorothiazide (MICROZIDE) 12.5 MG capsule Take 1 capsule (12.5 mg total) by mouth daily. 90 capsule 3  . lisinopril (ZESTRIL) 40 MG tablet Take 1 tablet (40 mg total) by mouth daily. 90 tablet 3   No current facility-administered medications for this visit.     Allergies:   Patient has no known allergies.    Social History:  The patient  reports that he has been smoking cigarettes. He has been smoking about 0.25 packs per day. He has never used smokeless tobacco. He reports current alcohol use. He reports current drug use. Drug: Marijuana.   Family History:  The patient's family history includes Diabetes in his brother; Heart disease in his mother; Kidney disease in his mother.    ROS: All other systems are reviewed and negative. Unless otherwise mentioned in H&P    PHYSICAL EXAM: VS:  BP (!) 143/78   Pulse 70   Ht 5\' 4"  (1.626 m)   Wt 211 lb (95.7 kg)   SpO2 95%   BMI 36.22 kg/m  , BMI Body mass index is 36.22 kg/m. GEN: Well nourished, well developed, in no acute distress, obese HEENT: normal Neck: no JVD, carotid bruits, or masses Cardiac:RRR; no murmurs, rubs, or gallops,no edema  Respiratory:  Clear to auscultation bilaterally, normal work of  breathing GI: soft, nontender, nondistended, + BS MS: no deformity or atrophy Skin: warm and dry, no rash Neuro:  Strength and sensation are intact Psych: euthymic mood, full affect   EKG: Not completed this office visit  Recent Labs: 01/23/2019: Magnesium 2.0; TSH 0.794 01/24/2019: ALT 20 01/26/2019: Hemoglobin 11.9; Platelets 487 04/04/2019: BUN 12; Creatinine, Ser 1.12; Potassium 4.9; Sodium 140    Lipid Panel    Component Value Date/Time   CHOL 194 03/04/2018 1038   TRIG 83 03/04/2018 1038   HDL 52 03/04/2018 1038   CHOLHDL 3.7 03/04/2018 1038   CHOLHDL 3.4 07/18/2016 0335   VLDL 12 07/18/2016 0335   LDLCALC 125 (H) 03/04/2018 1038       Wt Readings from Last 3 Encounters:  04/27/19 211 lb (95.7 kg)  04/13/19 205 lb 3.2 oz (93.1 kg)  03/28/19 206 lb 9.6 oz (93.7 kg)      Other studies Reviewed: Study Highlights 9//2020    There was no ST segment deviation noted during stress.  No T wave inversion was noted during stress.   No ischemic response Frequent PVC;s but no NSVT No chronotropic incompetence with HR increasing From 78 to 130 bpm which was 89% PMHR     ASSESSMENT AND PLAN:  1. Sinus bradycardia: Despite pauses and bradycardia seen on cardiac monitor, the patient has remained asymptomatic.  He has been seen by Dr. Caryl Comes, electrophysiologist, was review of his cardiac monitor.  He is not yet a candidate for pacemaker.  It has been explained to him should he begin to have symptoms of syncope, near syncope, chest pain, or dyspnea, he is to contact us.  He has a low threshold of having a need for pacemaker implantation sometime in the future.  2.  Hypertension: The patient's blood pressure is well controlled today.  We will continue him on current medication regimen of HCTZ 12.5 mg daily and lisinopril 40 mg daily.    Current medicines are reviewed at length with the patient today.    Labs/ tests ordered today include: None Phill Myron. West Pugh, ANP, AACC   04/27/2019 2:30 PM    [address] Office [phone] Fax [phone]  Notice: This dictation was prepared with Dragon dictation along with smaller phrase technology. Any transcriptional errors that result from this process are unintentional and may not be corrected upon review.

## 2019-04-27 ENCOUNTER — Ambulatory Visit (INDEPENDENT_AMBULATORY_CARE_PROVIDER_SITE_OTHER): Payer: Medicare Other | Admitting: Adult Health

## 2019-04-27 ENCOUNTER — Other Ambulatory Visit: Payer: Self-pay

## 2019-04-27 ENCOUNTER — Encounter: Payer: Self-pay | Admitting: Adult Health

## 2019-04-27 VITALS — BP 143/78 | HR 70 | Ht 64.0 in | Wt 211.0 lb

## 2019-04-27 DIAGNOSIS — I10 Essential (primary) hypertension: Secondary | ICD-10-CM | POA: Diagnosis not present

## 2019-04-27 DIAGNOSIS — R001 Bradycardia, unspecified: Secondary | ICD-10-CM

## 2019-04-27 DIAGNOSIS — R55 Syncope and collapse: Secondary | ICD-10-CM

## 2019-04-27 NOTE — Patient Instructions (Signed)
Medication Instructions:  Continue current medications  If you need a refill on your cardiac medications before your next appointment, please call your pharmacy.  Labwork: None Ordered   Testing/Procedures: None Ordered  Follow-Up: IN 6 months  In Person Jory Sims, DNP, ANP.    At [facility], you and your health needs are our priority.  As part of our continuing mission to provide you with exceptional heart care, we have created designated Provider Care Teams.  These Care Teams include your primary Cardiologist (physician) and Advanced Practice Providers (APPs -  Physician Assistants and Nurse Practitioners) who all work together to provide you with the care you need, when you need it.  Thank you for choosing [facility] at [facility]!!       Happy Holidays!!

## 2019-05-04 MED FILL — LISINOPRIL 40 MG TABLET: 40 | 90 days supply | Qty: 90 | Fill #0

## 2019-05-04 MED FILL — HYDROCHLOROTHIAZIDE 12.5 MG: 12.5 | 90 days supply | Qty: 90 | Fill #0

## 2019-08-01 MED FILL — LISINOPRIL 40 MG TABLET: 40 | 90 days supply | Qty: 90 | Fill #1

## 2019-09-01 ENCOUNTER — Telehealth: Payer: Self-pay | Admitting: Licensed Clinical Social Worker

## 2019-09-01 ENCOUNTER — Telehealth: Payer: Self-pay

## 2019-09-01 NOTE — Progress Notes (Signed)
  Heart and Vascular Care Navigation  09/01/2019  BRENDA MILUM [birth year] [id number]  Reason for Referral: CSW referred to assist with senior housing options.  Assessment: Patient is a 79yo male who has been married for 5 years although has been with his wife for 13 years. Patient and wife have been living in the [facility] for the past year as they were previously living with his sister whose home had mold and had to be evacuated. Patient receives $800 monthly income and wife reports she has been denied disability and has no income. They receive $215 food stamps and struggle to have food all month long. Wife reports that patient takes the bus to medical appointments.    HRT/VAS Care Coordination    Patients Home Cardiology Office  [place] Team  Social Worker   Social Worker Name:  Raquel Sarna, [place]   Living arrangements for the past 2 months  No permanent address Patient has been staying at the [facility] for the past year.   Lives with:  Spouse   Patient Current Insurance Coverage  Managed Medicare   Patient Has Concern With Paying Medical Bills  No   Does Patient Have Prescription Coverage?  Yes   Home Assistive Devices/Equipment  None      Social History: SDOH Screenings   Alcohol Screen:   . Last Alcohol Screening Score (AUDIT):   Depression (PHQ2-9):   . PHQ-2 Score:   Financial Resource Strain: High Risk  . Difficulty of Paying Living Expenses: Very hard  Food Insecurity: Food Insecurity Present  . Worried About Charity fundraiser in the Last Year: Often true  . Ran Out of Food in the Last Year: Often true  Housing: High Risk  . Last Housing Risk Score: 4  Physical Activity:   . Days of Exercise per Week:   . Minutes of Exercise per Session:   Social Connections:   . Frequency of Communication with Friends and Family:   . Frequency of Social Gatherings with Friends and Family:   . Attends Religious Services:   . Active  Member of Clubs or Organizations:   . Attends Archivist Meetings:   Marland Kitchen Marital Status:   Stress:   . Feeling of Stress :   Tobacco Use: High Risk  . Smoking Tobacco Use: Current Every Day Smoker  . Smokeless Tobacco Use: Never Used  Transportation Needs: Unmet Transportation Needs  . Lack of Transportation (Medical): Yes  . Lack of Transportation (Non-Medical): Yes    SDOH Interventions: Financial Resources:  Financial Strain Interventions: Other (Comment)(Will encouarge wife to apply for benefits (disability and medicaid))   Food Insecurity:  Food Insecurity Interventions: (Patient receives food stamps)  Housing Insecurity:  Housing Interventions: Other (Comment)(CSW will refer for senior housing)  Transportation:   Transportation Interventions: [place] [employer], [employer] ([employer])    Follow-up plan: CSW will follow up with housing resources and return call to patient and wife with some possible options. CSW will also follow up with transport needs and food bank referrals as identified. CSW encouraged wife to follow up with disability application to for possible additional income. Patient and wife grateful for the assistance and support and will await return call from [place]. Raquel Sarna, [place], [place]

## 2019-09-01 NOTE — Telephone Encounter (Signed)
sent in a referral for care navigation assistance.

## 2019-09-02 ENCOUNTER — Telehealth: Payer: Self-pay | Admitting: Licensed Clinical Social Worker

## 2019-09-02 NOTE — Telephone Encounter (Signed)
CSW contacted patient's wife to discuss further options for housing. CSW will mail a list of senior housing options for wife to follow up as CSW continues to explore options through housing agencies. Wife grateful for the support and information and verbalizes understanding of follow up needed. Raquel Sarna, [place], [place]

## 2019-09-12 ENCOUNTER — Telehealth: Payer: Self-pay | Admitting: Licensed Clinical Social Worker

## 2019-09-12 NOTE — Telephone Encounter (Signed)
CSW attempted to follow up with wife regarding housing information but unable to leave a message on voicemail. CSW available should wife return call. Raquel Sarna, [place], [place]

## 2019-11-02 MED FILL — LISINOPRIL 40 MG TABLET: 40 | 90 days supply | Qty: 90 | Fill #2

## 2019-12-23 MED FILL — DOXYCYCLINE HYCLATE 100 MG: 100 | 7 days supply | Qty: 14 | Fill #0

## 2019-12-23 MED FILL — CLOBETASOL PROPIONATE 0.05: 0.05 | 20 days supply | Qty: 45 | Fill #0

## 2020-01-16 MED FILL — LISINOPRIL 40 MG TABLET: 40 | 90 days supply | Qty: 90 | Fill #3

## 2020-01-16 MED FILL — HYDROCHLOROTHIAZIDE 12.5 MG: 12.5 | 90 days supply | Qty: 90 | Fill #2

## 2020-01-24 ENCOUNTER — Telehealth: Payer: Self-pay | Admitting: Adult Health

## 2020-01-24 NOTE — Telephone Encounter (Signed)
Called triage twice but did not get an answer

## 2020-01-24 NOTE — Telephone Encounter (Signed)
Pt c/o Shortness Of Breath: STAT if SOB developed within the last 24 hours or pt is noticeably SOB on the phone  1. Are you currently SOB (can you hear that pt is SOB on the phone)? Yes  2. How long have you been experiencing SOB? 3 weeks  3. Are you SOB when sitting or when up moving around? Both   4. Are you currently experiencing any other symptoms? Laying around, severely fatigued, lightheadedness

## 2020-01-24 NOTE — Telephone Encounter (Signed)
Tried to call patient twice no answer. He does not have voicemail to leave message. Since office is closed will have triage try to call patient in the morning.

## 2020-01-24 NOTE — Telephone Encounter (Signed)
Wife Todd Ferguson called back this evening.  Todd Ferguson has been progressively more fatigued for the past month with associated exertional dyspnea. This has progressed to the point that he is minimally active. He is now mildly dyspneic at rest, worse just walking to the bathroom. Wife does endorse swelling now in his feet, ankles, and hands. Feels like heart is beating fast. He has been trying to avoid going to the hospital.   Given his history, suspect this may be symptomatic atrial fibrillation, possibly with arrhythmia-mediated cardiomyopathy. Recommended ED evaluation. His wife is agreeable and plans to call EMS.

## 2020-01-25 ENCOUNTER — Ambulatory Visit: Payer: Medicare Other | Admitting: General Practice

## 2020-01-25 NOTE — Telephone Encounter (Signed)
Attempted outreach to Pt x2.  No answer.  No VM.

## 2020-01-25 NOTE — Telephone Encounter (Signed)
Call to Dr. Adela Ports office and received updated contact information for patient [phone].  Spoke with patient who states he is doing "ok" today.  Denied chest pain, current shortness of breath or discomfort.  Pt offered appointment for today, he declined due to the "rain."  Requested an appt another day.  Pt accepted an appt Friday 01/27/2020 with Rosaria Ferries, explained to patient he would be seeing a NP and he was appreciative.  Pt confirmed he knew where the office was and would keep the appt.  Pt was able to speak in full sentences, no shortness of breath noted during call. Writer instructed patient to contact the office if symptoms worsened.  Or EMS if we was experiencing increased shortness of breath or chest pain.  Georgana Curio MHA RN CCM

## 2020-01-25 NOTE — Progress Notes (Deleted)
Cardiology Clinic Note   Patient Name: Todd Ferguson Date of Encounter: 01/25/2020  Primary Care Provider:  Jilda Panda, MD Primary Cardiologist:  Minus Breeding, MD  Patient Profile    ***  Past Medical History    Past Medical History:  Diagnosis Date  . Bradycardia   . 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  . Hypertension   . Tobacco abuse    Past Surgical History:  Procedure Laterality Date  . APPENDECTOMY    . DENTAL SURGERY    . Removed right eye      Allergies  No Known Allergies  History of Present Illness    ***  Home Medications    Prior to Admission medications   Medication Sig Start Date End Date Taking? Authorizing Provider  aspirin EC 81 MG tablet Take 1 tablet (81 mg total) by mouth daily. 03/04/18   Fulp, Cammie, MD  hydrochlorothiazide (MICROZIDE) 12.5 MG capsule Take 1 capsule (12.5 mg total) by mouth daily. 03/28/19 06/26/19  Lendon Colonel, NP  lisinopril (ZESTRIL) 40 MG tablet Take 1 tablet (40 mg total) by mouth daily. 03/28/19   Lendon Colonel, NP    Family History    Family History  Problem Relation Age of Onset  . Diabetes Brother   . Heart disease Mother   . Kidney disease Mother   . Colon cancer Neg Hx   . Esophageal cancer Neg Hx   . Rectal cancer Neg Hx   . Stomach cancer Neg Hx   . Prostate cancer Neg Hx   . Pancreatic cancer Neg Hx    He indicated that his mother is deceased. He indicated that his father is deceased. He indicated that his brother is deceased. He indicated that the status of his neg hx is unknown.  Social History    Social History   Socioeconomic History  . Marital status: Married    Spouse name: Not on file  . Number of children: Not on file  . Years of education: Not on file  . Highest education level: Not on file  Occupational History  . Not on file  Tobacco Use  . Smoking status: Current Every Day Smoker    Packs/day:  0.25    Types: Cigarettes  . Smokeless tobacco: Never Used  . Tobacco comment: occassionally   Substance and Sexual Activity  . Alcohol use: Yes    Comment: socially  . Drug use: Yes    Types: Marijuana    Comment: Marijuana about 1 week ago.  Marland Kitchen Sexual activity: Not on file  Other Topics Concern  . Not on file  Social History Narrative  . Not on file   Social Determinants of Health   Financial Resource Strain: High Risk  . Difficulty of Paying Living Expenses: Very hard  Food Insecurity: Food Insecurity Present  . Worried About Charity fundraiser in the Last Year: Often true  . Ran Out of Food in the Last Year: Often true  Transportation Needs: Unmet Transportation Needs  . Lack of Transportation (Medical): Yes  . Lack of Transportation (Non-Medical): Yes  Physical Activity:   . Days of Exercise per Week: Not on file  . Minutes of Exercise per Session: Not on file  Stress:   . Feeling of Stress : Not on file  Social Connections:   . Frequency of Communication with Friends and Family: Not on file  .  Frequency of Social Gatherings with Friends and Family: Not on file  . Attends Religious Services: Not on file  . Active Member of Clubs or Organizations: Not on file  . Attends Archivist Meetings: Not on file  . Marital Status: Not on file  Intimate Partner Violence:   . Fear of Current or Ex-Partner: Not on file  . Emotionally Abused: Not on file  . Physically Abused: Not on file  . Sexually Abused: Not on file     Review of Systems    General:  No chills, fever, night sweats or weight changes.  Cardiovascular:  No chest pain, dyspnea on exertion, edema, orthopnea, palpitations, paroxysmal nocturnal dyspnea. Dermatological: No rash, lesions/masses Respiratory: No cough, dyspnea Urologic: No hematuria, dysuria Abdominal:   No nausea, vomiting, diarrhea, bright red blood per rectum, melena, or hematemesis Neurologic:  No visual changes, wkns, changes in  mental status. All other systems reviewed and are otherwise negative except as noted above.  Physical Exam    VS:  There were no vitals taken for this visit. , BMI There is no height or weight on file to calculate BMI. GEN: Well nourished, well developed, in no acute distress. HEENT: normal. Neck: Supple, no JVD, carotid bruits, or masses. Cardiac: RRR, no murmurs, rubs, or gallops. No clubbing, cyanosis, edema.  Radials/DP/PT 2+ and equal bilaterally.  Respiratory:  Respirations regular and unlabored, clear to auscultation bilaterally. GI: Soft, nontender, nondistended, BS + x 4. MS: no deformity or atrophy. Skin: warm and dry, no rash. Neuro:  Strength and sensation are intact. Psych: Normal affect.  Accessory Clinical Findings    Recent Labs: 01/26/2019: Hemoglobin 11.9; Platelets 487 04/04/2019: BUN 12; Creatinine, Ser 1.12; Potassium 4.9; Sodium 140   Recent Lipid Panel    Component Value Date/Time   CHOL 194 03/04/2018 1038   TRIG 83 03/04/2018 1038   HDL 52 03/04/2018 1038   CHOLHDL 3.7 03/04/2018 1038   CHOLHDL 3.4 07/18/2016 0335   VLDL 12 07/18/2016 0335   LDLCALC 125 (H) 03/04/2018 1038    ECG personally reviewed by me today- *** - No acute changes  Assessment & Plan   1.  ***   Jossie Ng. Heloise Gordan NP-C    01/25/2020, 10:29 AM [address] Office [phone] Fax [phone]  Notice: This dictation was prepared with Dragon dictation along with smaller phrase technology. Any transcriptional errors that result from this process are unintentional and may not be corrected upon review.

## 2020-01-25 NOTE — Telephone Encounter (Signed)
Does not appear Pt called EMS as instructed.  Appt made for Pt to be seen at [facility] office this afternoon.  Attempted to call Pt-No answer and no VM.  Will attempt to call again shortly.

## 2020-01-25 NOTE — Telephone Encounter (Signed)
Attempted outreach to Pt x 3.  No answer.  Unable to leave message.  Will cancel appt made for today and forward to primary cardiologist for further follow up.

## 2020-01-26 NOTE — Progress Notes (Signed)
Cardiology Office Note   Date:  01/27/2020   ID:  Todd Ferguson, DOB March 08, 1941, MRN [id number]  PCP:  Jilda Panda, MD Cardiologist:  Minus Breeding, MD 07/18/2016 in-hospital Electrphysiologist: Virl Axe, MD 04/13/2019 Rosaria Ferries, PA-C   No chief complaint on file.   History of Present Illness: Todd Ferguson is a 79 y.o. male with a history of bradycardia and sinus pauses on monitor >>EP saw and no assoc sx >>no PPM (avoid rate-lowering rx),  HTN, neg GXT (HR increased w/ activity), tob use,  ?PAF (see 01/2019 echo report)  Phone notes 09/07 about SOB, appt made 04/16 phone notes about housing, SW involved. Pt says he is in a rooming house, this is stable  Todd Ferguson presents for cardiology follow up.  His SOB has improved. He will get SOB if he moves too fast. Denies SOB with exertion.   No LE edema. Denies PND, except when nose gets stopped up. He will then wake up coughing. He will use Afrin and the sx will improve. Does not have to do this every night, thinks about twice a week. This does not seem to happen if he sleeps with his head up. Thinks it happens if his head is tilted down.   Does not get chest pain. Longest walk if from the bus stop to the Dr's office. Does not get SOB with this.   No hx presyncope or syncope. Does not get lightheaded or dizzy  Never gets chest pain.  He is not interested in a sleep study, says he does not want to sleep with all that stuff on.    Past Medical History:  Diagnosis Date  . Bradycardia   . 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  . Hypertension   . Tobacco abuse     Past Surgical History:  Procedure Laterality Date  . APPENDECTOMY    . DENTAL SURGERY    . Removed right eye      Current Outpatient Medications  Medication Sig Dispense Refill  . aspirin EC 81 MG tablet Take 1 tablet (81 mg total) by mouth daily. 30 tablet 6  .  hydrochlorothiazide (MICROZIDE) 12.5 MG capsule Take 1 capsule (12.5 mg total) by mouth daily. 90 capsule 3  . lisinopril (ZESTRIL) 40 MG tablet Take 1 tablet (40 mg total) by mouth daily. 90 tablet 3   No current facility-administered medications for this visit.    Allergies:   Patient has no known allergies.    Social History:  The patient  reports that he has been smoking cigarettes. He has been smoking about 0.25 packs per day. He has never used smokeless tobacco. He reports current alcohol use. He reports current drug use. Drug: Marijuana.   Family History:  The patient's family history includes Diabetes in his brother; Heart disease in his mother; Kidney disease in his mother.  He indicated that his mother is deceased. He indicated that his father is deceased. He indicated that his brother is deceased. He indicated that the status of his neg hx is unknown.  ROS:  Please see the history of present illness. All other systems are reviewed and negative.    PHYSICAL EXAM: VS:  BP 136/84   Pulse 64   Ht 5\' 4"  (1.626 m)   Wt 207 lb 12.8 oz (94.3 kg)   SpO2 95%   BMI 35.67 kg/m  , BMI Body  mass index is 35.67 kg/m. GEN: Well nourished, well developed, male in no acute distress HEENT: normal for age, R eye removed Neck: no JVD, no carotid bruit, no masses Cardiac: RRR; no murmur, no rubs, or gallops Respiratory:  clear to auscultation bilaterally, normal work of breathing GI: soft, nontender, nondistended, + BS MS: no deformity or atrophy; no edema; distal pulses are 2+ in all 4 extremities  Skin: warm and dry, no rash Neuro:  Strength and sensation are intact Psych: euthymic mood, full affect   EKG:  EKG is ordered today. The ekg ordered today demonstrates SR, HR 60, no acute ischemic changes  ECHO: 01/24/2019 1. The left ventricle has normal systolic function with an ejection  fraction of 60-65%. The cavity size was normal. Left ventricular diastolic  function could not be  evaluated secondary to atrial fibrillation.  2. The right ventricle has normal systolic function. The cavity was  normal. There is no increase in right ventricular wall thickness. Right  ventricular systolic pressure could not be assessed.  3. Mild calcification of the anterior mitral valve leaflet.  4. The aortic valve is tricuspid. Moderate calcification of the aortic  valve. Aortic valve regurgitation was not assessed by color flow Doppler.  Mild stenosis of the aortic valve.  5. The aorta is normal unless otherwise noted.  6. The inferior vena cava was dilated in size with >50% respiratory  variability.   ETT: 01/26/2019  There was no ST segment deviation noted during stress.  No T wave inversion was noted during stress.   No ischemic response Frequent PVC;s but no NSVT No chronotropic incompetence with HR increasing From 78 to 130 bpm which was 89% PMHR  MONITOR: 04/05/2019 Patient had a min HR of 28 bpm, max HR of 146 bpm, and avg HR of 63 bpm. Predominant underlying rhythm was Sinus Rhythm.    Bradycardia noted frequently.   Runs of sinus bradycardia with occasional sinus pauses. Infrequent ventricular ectopy with paired PVCs Brief runs of junctional rhythm.  Recent Labs: 04/04/2019: BUN 12; Creatinine, Ser 1.12; Potassium 4.9; Sodium 140  CBC    Component Value Date/Time   WBC 11.4 (H) 01/26/2019 0323   RBC 3.97 (L) 01/26/2019 0323   HGB 11.9 (L) 01/26/2019 0323   HCT 35.6 (L) 01/26/2019 0323   PLT 487 (H) 01/26/2019 0323   MCV 89.7 01/26/2019 0323   MCH 30.0 01/26/2019 0323   MCHC 33.4 01/26/2019 0323   RDW 14.0 01/26/2019 0323   LYMPHSABS 3.0 01/23/2019 0508   MONOABS 0.9 01/23/2019 0508   EOSABS 0.3 01/23/2019 0508   BASOSABS 0.1 01/23/2019 0508   CMP Latest Ref Rng & Units 04/04/2019 01/24/2019 01/23/2019  Glucose 65 - 99 mg/dL 95 107(H) -  BUN 8 - 27 mg/dL 12 13 -  Creatinine 0.76 - 1.27 mg/dL 1.12 1.06 1.02  Sodium 134 - 144 mmol/L 140 137 -   Potassium 3.5 - 5.2 mmol/L 4.9 4.8 -  Chloride 96 - 106 mmol/L 103 107 -  CO2 20 - 29 mmol/L 21 23 -  Calcium 8.6 - 10.2 mg/dL 9.6 8.6(L) -  Total Protein 6.5 - 8.1 g/dL - 6.4(L) -  Total Bilirubin 0.3 - 1.2 mg/dL - 0.4 -  Alkaline Phos 38 - 126 U/L - 54 -  AST 15 - 41 U/L - 19 -  ALT 0 - 44 U/L - 20 -     Lipid Panel Lab Results  Component Value Date   CHOL 194 03/04/2018  HDL 52 03/04/2018   LDLCALC 125 (H) 03/04/2018   TRIG 83 03/04/2018   CHOLHDL 3.7 03/04/2018      Wt Readings from Last 3 Encounters:  01/27/20 207 lb 12.8 oz (94.3 kg)  04/27/19 211 lb (95.7 kg)  04/13/19 205 lb 3.2 oz (93.1 kg)     Other studies Reviewed: Additional studies/ records that were reviewed today include: Office notes, hospital records and testing.  ASSESSMENT AND PLAN:  1.  SOB:  - sx have resolved - wt is stable - no sig volume overload on exam - continue low-dose HCTZ  2. Dyslipidemia - LDL was elevated a year ago. - Realizing that he is not fasting, ck labs today   3. HTN - BP is a little above target, but acceptable - continue low-dose HCTZ and lisinopril - ck Labs  4. Bradycardia - no sx attributable to this - no interest in pursuing sleep study - follow for sx, no BB/CCB  Current medicines are reviewed at length with the patient today.  The patient does not have concerns regarding medicines.  The following changes have been made:  Renew rx  Labs/ tests ordered today include:  No orders of the defined types were placed in this encounter.    Disposition:   FU with Minus Breeding, MD 1 year  Signed, Rosaria Ferries, PA-C  01/27/2020 3:47 PM    [place] Phone: [phone]; Fax: [phone]

## 2020-01-27 ENCOUNTER — Other Ambulatory Visit: Payer: Self-pay | Admitting: Physician Assistant

## 2020-01-27 ENCOUNTER — Encounter: Payer: Self-pay | Admitting: Physician Assistant

## 2020-01-27 ENCOUNTER — Other Ambulatory Visit: Payer: Self-pay

## 2020-01-27 ENCOUNTER — Ambulatory Visit (INDEPENDENT_AMBULATORY_CARE_PROVIDER_SITE_OTHER): Payer: Medicare (Managed Care) | Admitting: Physician Assistant

## 2020-01-27 ENCOUNTER — Ambulatory Visit: Payer: Medicare Other | Admitting: Physician Assistant

## 2020-01-27 VITALS — BP 136/84 | HR 64 | Ht 64.0 in | Wt 207.8 lb

## 2020-01-27 DIAGNOSIS — R0602 Shortness of breath: Secondary | ICD-10-CM

## 2020-01-27 DIAGNOSIS — E785 Hyperlipidemia, unspecified: Secondary | ICD-10-CM

## 2020-01-27 DIAGNOSIS — I10 Essential (primary) hypertension: Secondary | ICD-10-CM | POA: Diagnosis not present

## 2020-01-27 DIAGNOSIS — R001 Bradycardia, unspecified: Secondary | ICD-10-CM | POA: Diagnosis not present

## 2020-01-27 MED ORDER — HYDROCHLOROTHIAZIDE 12.5 MG PO CAPS: 12.5000 mg | ORAL_CAPSULE | Freq: Every day | ORAL | 3 refills | Status: DC

## 2020-01-27 MED ORDER — LISINOPRIL 40 MG PO TABS: 40.0000 mg | ORAL_TABLET | Freq: Every day | ORAL | 3 refills | Status: DC

## 2020-01-27 NOTE — Patient Instructions (Signed)
Medication Instructions:  Your physician recommends that you continue on your current medications as directed. Please refer to the Current Medication list given to you today.  *If you need a refill on your cardiac medications before your next appointment, please call your pharmacy*   Lab Work: Your physician recommends that you return for lab work today: CMET and Lipid Profile  If you have labs (blood work) drawn today and your tests are completely normal, you will receive your results only by: Marland Kitchen MyChart Message (if you have MyChart) OR . A paper copy in the mail If you have any lab test that is abnormal or we need to change your treatment, we will call you to review the results.   Follow-Up: At [facility], you and your health needs are our priority.  As part of our continuing mission to provide you with exceptional heart care, we have created designated Provider Care Teams.  These Care Teams include your primary Cardiologist (physician) and Advanced Practice Providers (APPs -  Physician Assistants and Nurse Practitioners) who all work together to provide you with the care you need, when you need it.  We recommend signing up for the patient portal called "MyChart".  Sign up information is provided on this After Visit Summary.  MyChart is used to connect with patients for Virtual Visits (Telemedicine).  Patients are able to view lab/test results, encounter notes, upcoming appointments, etc.  Non-urgent messages can be sent to your provider as well.   To learn more about what you can do with MyChart, go to [email or web address].    Your next appointment:   12 month(s)  The format for your next appointment:   In Person  Provider:   You may see Minus Breeding, MD or one of the following Advanced Practice Providers on your designated Care Team:    Rosaria Ferries, PA-C  Jory Sims, DNP, ANP  Cadence Kathlen Mody, PA-C    Other Instructions Please call our office 2 months in  advance to schedule your follow-up appointment with Dr. Percival Spanish.

## 2020-01-28 LAB — COMPREHENSIVE METABOLIC PANEL
ALT: 10 IU/L (ref 0–44)
AST: 13 IU/L (ref 0–40)
Albumin/Globulin Ratio: 1.3 (ref 1.2–2.2)
Albumin: 4.3 g/dL (ref 3.7–4.7)
Alkaline Phosphatase: 59 IU/L (ref 48–121)
BUN/Creatinine Ratio: 9 — ABNORMAL LOW (ref 10–24)
BUN: 10 mg/dL (ref 8–27)
Bilirubin Total: 0.3 mg/dL (ref 0.0–1.2)
CO2: 25 mmol/L (ref 20–29)
Calcium: 9.8 mg/dL (ref 8.6–10.2)
Chloride: 102 mmol/L (ref 96–106)
Creatinine, Ser: 1.12 mg/dL (ref 0.76–1.27)
GFR calc Af Amer: 73 mL/min/{1.73_m2} (ref >59)
GFR calc non Af Amer: 63 mL/min/{1.73_m2} (ref >59)
Globulin, Total: 3.3 g/dL (ref 1.5–4.5)
Glucose: 84 mg/dL (ref 65–99)
Potassium: 4.9 mmol/L (ref 3.5–5.2)
Sodium: 140 mmol/L (ref 134–144)
Total Protein: 7.6 g/dL (ref 6.0–8.5)

## 2020-01-28 LAB — LIPID PANEL
Chol/HDL Ratio: 3.8 ratio (ref 0.0–5.0)
Cholesterol, Total: 189 mg/dL (ref 100–199)
HDL: 50 mg/dL (ref >39)
LDL Chol Calc (NIH): 123 mg/dL — ABNORMAL HIGH (ref 0–99)
Triglycerides: 85 mg/dL (ref 0–149)
VLDL Cholesterol Cal: 16 mg/dL (ref 5–40)

## 2020-03-14 IMAGING — CR DG WRIST COMPLETE 3+V*L*
4 series · 4 of 4 positions shown · non-contrast
Comparison: None.

CLINICAL DATA: Swelling

EXAM:
LEFT WRIST - COMPLETE 3+ VIEW

[x wrist pa left]
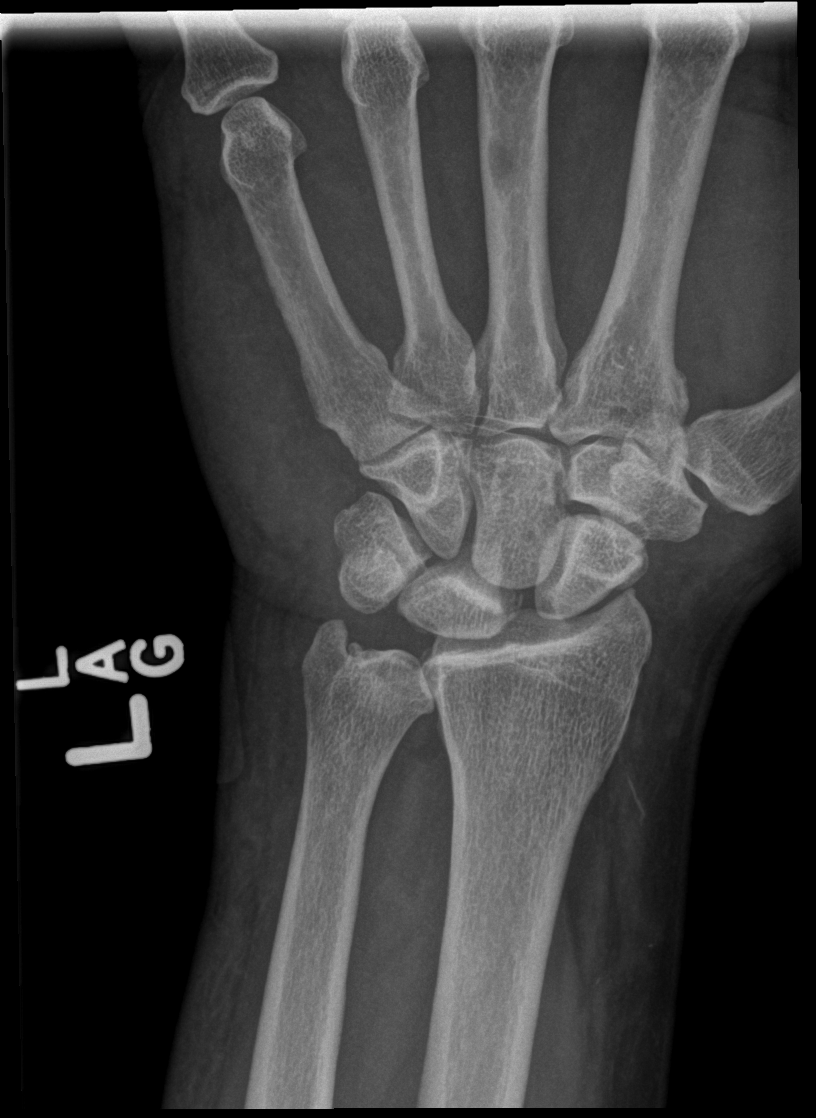

[x wrist obl left]
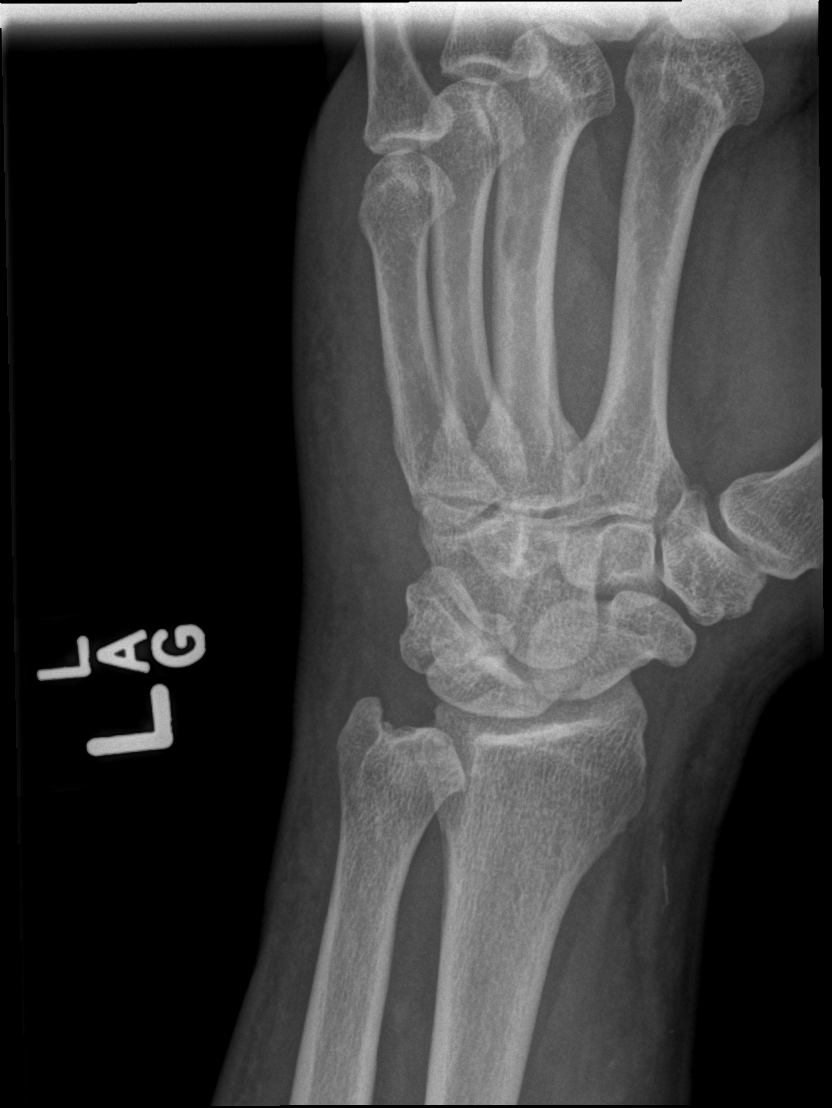

[x wrist lat left]
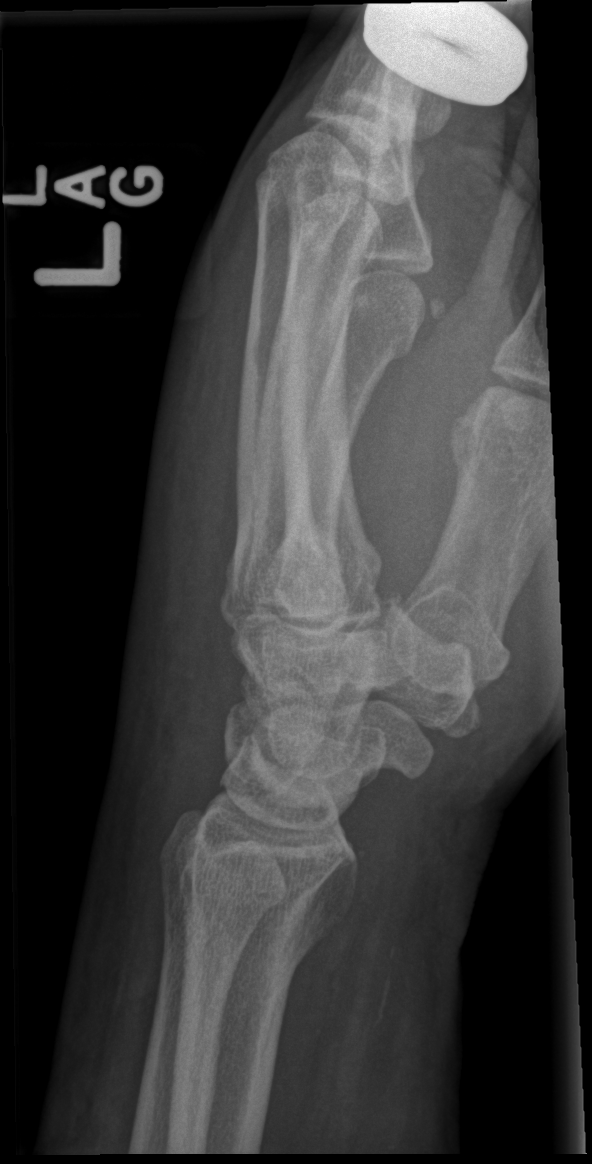

[x wrist navicular view left]
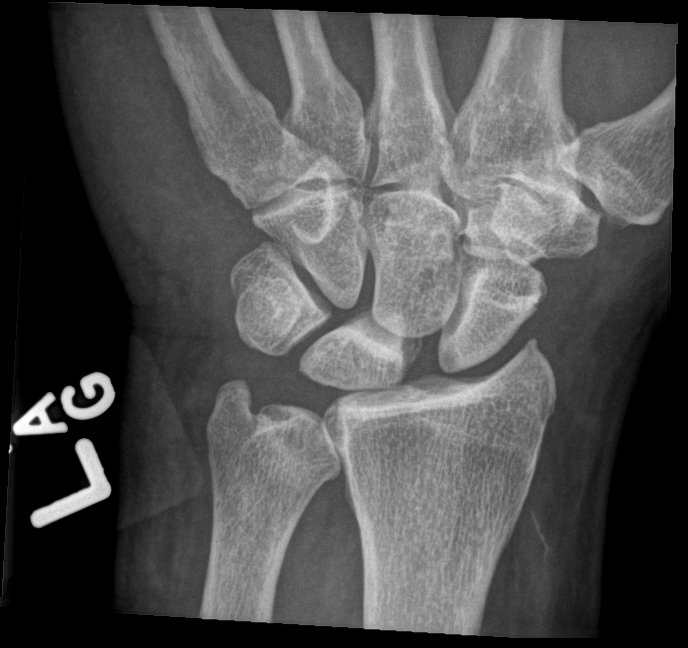

[4 of 4 positions shown; findings below may reference images not displayed]

FINDINGS: No fracture or malalignment. Widened appearing scapholunate interval
up to 6 mm. Joint spaces appear maintained. Possible vascular
calcifications volar wrist. Diffuse soft tissue swelling
IMPRESSION: 1. No acute osseous abnormality
2. Widening at the scapholunate interval suggesting possible
ligamentous injury

## 2020-04-09 ENCOUNTER — Other Ambulatory Visit: Payer: Self-pay | Admitting: Cardiology

## 2020-04-09 ENCOUNTER — Telehealth: Payer: Self-pay | Admitting: Physician Assistant

## 2020-04-09 DIAGNOSIS — I10 Essential (primary) hypertension: Secondary | ICD-10-CM

## 2020-04-09 MED ORDER — LISINOPRIL 40 MG PO TABS: 40.0000 mg | ORAL_TABLET | Freq: Every day | ORAL | 3 refills | Status: DC

## 2020-04-09 MED ORDER — HYDROCHLOROTHIAZIDE 12.5 MG PO CAPS: 12.5000 mg | ORAL_CAPSULE | Freq: Every day | ORAL | 3 refills | Status: DC

## 2020-04-09 MED FILL — LISINOPRIL 40 MG TABLET: 40 | 90 days supply | Qty: 90 | Fill #0

## 2020-04-09 NOTE — Telephone Encounter (Signed)
REFILL SENT AS REQUESTED ?

## 2020-04-09 NOTE — Telephone Encounter (Signed)
*  STAT* If patient is at the pharmacy, call can be transferred to refill team.   1. Which medications need to be refilled? (please list name of each medication and dose if known)  lisinopril (ZESTRIL) 40 MG tablet hydrochlorothiazide (MICROZIDE) 12.5 MG capsule  2. Which pharmacy/location (including street and city if local pharmacy) is medication to be sent to? [place], [place]  3. Do they need a 30 day or 90 day supply? 90 day   Patient is out of medication

## 2020-04-10 MED FILL — HYDROCHLOROTHIAZIDE 12.5 MG: 12.5 | 90 days supply | Qty: 90 | Fill #0

## 2020-07-06 ENCOUNTER — Other Ambulatory Visit: Payer: Self-pay | Admitting: Internal Medicine

## 2020-07-06 MED FILL — CLOTRIMAZOLE-BETAMETHASONE: 1-0.05 | 15 days supply | Qty: 15 | Fill #0

## 2020-09-18 ENCOUNTER — Other Ambulatory Visit: Payer: Self-pay

## 2020-09-18 ENCOUNTER — Other Ambulatory Visit: Payer: Self-pay | Admitting: Cardiology

## 2020-09-18 DIAGNOSIS — I10 Essential (primary) hypertension: Secondary | ICD-10-CM

## 2020-09-18 MED ORDER — LISINOPRIL 40 MG PO TABS
40.0000 mg | ORAL_TABLET | Freq: Every day | ORAL | 3 refills | Status: DC
  Filled 2020-09-18: qty 90, 90d supply, fill #0
  Filled 2021-01-12: qty 90, 90d supply, fill #1
  Filled 2021-04-24 – 2021-05-07 (×4): qty 90, 90d supply, fill #2
  Filled 2021-07-28: qty 90, 90d supply, fill #3
  Filled 2021-07-29: qty 90, 90d supply, fill #0

## 2020-09-18 MED FILL — Hydrochlorothiazide Cap 12.5 MG: ORAL | 30 days supply | Qty: 30 | Fill #0 | Status: AC

## 2020-09-19 ENCOUNTER — Other Ambulatory Visit: Payer: Self-pay

## 2020-09-20 ENCOUNTER — Other Ambulatory Visit: Payer: Self-pay

## 2020-10-26 MED FILL — Hydrochlorothiazide Cap 12.5 MG: ORAL | 30 days supply | Qty: 30 | Fill #1 | Status: AC

## 2020-10-29 ENCOUNTER — Other Ambulatory Visit: Payer: Self-pay

## 2020-12-09 MED FILL — Hydrochlorothiazide Cap 12.5 MG: ORAL | 30 days supply | Qty: 30 | Fill #2 | Status: AC

## 2020-12-10 ENCOUNTER — Other Ambulatory Visit: Payer: Self-pay

## 2020-12-11 ENCOUNTER — Other Ambulatory Visit: Payer: Self-pay

## 2021-01-12 MED FILL — Hydrochlorothiazide Cap 12.5 MG: ORAL | 30 days supply | Qty: 30 | Fill #3 | Status: AC

## 2021-01-14 ENCOUNTER — Other Ambulatory Visit: Payer: Self-pay

## 2021-01-22 ENCOUNTER — Encounter: Payer: Self-pay | Admitting: Gastroenterology

## 2021-01-23 ENCOUNTER — Other Ambulatory Visit: Payer: Self-pay | Admitting: Internal Medicine

## 2021-01-23 DIAGNOSIS — Z122 Encounter for screening for malignant neoplasm of respiratory organs: Secondary | ICD-10-CM

## 2021-02-08 ENCOUNTER — Ambulatory Visit
Admission: RE | Admit: 2021-02-08 | Discharge: 2021-02-08 | Disposition: A | Discharge status: Home / Self Care | Payer: Medicare Other | Source: Ambulatory Visit | Attending: Internal Medicine | Admitting: Internal Medicine

## 2021-02-08 ENCOUNTER — Other Ambulatory Visit: Payer: Self-pay

## 2021-02-08 DIAGNOSIS — Z122 Encounter for screening for malignant neoplasm of respiratory organs: Secondary | ICD-10-CM

## 2021-04-25 ENCOUNTER — Other Ambulatory Visit: Payer: Self-pay

## 2021-05-02 ENCOUNTER — Other Ambulatory Visit: Payer: Self-pay

## 2021-05-08 ENCOUNTER — Other Ambulatory Visit: Payer: Self-pay

## 2021-05-09 ENCOUNTER — Other Ambulatory Visit: Payer: Self-pay

## 2021-07-30 ENCOUNTER — Other Ambulatory Visit: Payer: Self-pay

## 2021-08-09 ENCOUNTER — Other Ambulatory Visit: Payer: Self-pay

## 2021-12-12 ENCOUNTER — Telehealth: Payer: Self-pay | Admitting: Cardiology

## 2021-12-12 NOTE — Telephone Encounter (Signed)
STAT if HR is under 50 or over 120 (normal HR is 60-100 beats per minute)  What is your heart rate?   Do you have a log of your heart rate readings (document readings)? 85 - Tuesday  Do you have any other symptoms? Spouse states that pt has been loosing weight as well as been having swelling in his hands and feet

## 2021-12-12 NOTE — Telephone Encounter (Signed)
Spoke to patient's wife.She stated husband has been having a slow heart beat.Swelling in both feet.Stated he has lost about 20 lbs.He can hardly walk across room.Appointment scheduled with Laurann Montana NP 8/1 at 8:50 am at [facility] location.Advised to bring a list of all medications.

## 2021-12-13 ENCOUNTER — Other Ambulatory Visit: Payer: Self-pay

## 2021-12-17 ENCOUNTER — Ambulatory Visit (INDEPENDENT_AMBULATORY_CARE_PROVIDER_SITE_OTHER): Payer: Medicare Other | Admitting: Family

## 2021-12-17 ENCOUNTER — Encounter (HOSPITAL_BASED_OUTPATIENT_CLINIC_OR_DEPARTMENT_OTHER): Payer: Self-pay | Admitting: Family

## 2021-12-17 VITALS — BP 148/92 | HR 55 | Ht 64.0 in | Wt 201.4 lb

## 2021-12-17 DIAGNOSIS — R001 Bradycardia, unspecified: Secondary | ICD-10-CM | POA: Diagnosis not present

## 2021-12-17 DIAGNOSIS — I10 Essential (primary) hypertension: Secondary | ICD-10-CM | POA: Diagnosis not present

## 2021-12-17 DIAGNOSIS — E782 Mixed hyperlipidemia: Secondary | ICD-10-CM

## 2021-12-17 DIAGNOSIS — I209 Angina pectoris, unspecified: Secondary | ICD-10-CM

## 2021-12-17 DIAGNOSIS — I350 Nonrheumatic aortic (valve) stenosis: Secondary | ICD-10-CM

## 2021-12-17 DIAGNOSIS — R0609 Other forms of dyspnea: Secondary | ICD-10-CM | POA: Diagnosis not present

## 2021-12-17 LAB — BASIC METABOLIC PANEL
BUN/Creatinine Ratio: 9 — ABNORMAL LOW (ref 10–24)
BUN: 9 mg/dL (ref 8–27)
CO2: 22 mmol/L (ref 20–29)
Calcium: 9.9 mg/dL (ref 8.6–10.2)
Chloride: 108 mmol/L — ABNORMAL HIGH (ref 96–106)
Creatinine, Ser: 1 mg/dL (ref 0.76–1.27)
Glucose: 96 mg/dL (ref 70–99)
Potassium: 4.7 mmol/L (ref 3.5–5.2)
Sodium: 143 mmol/L (ref 134–144)
eGFR: 77 mL/min/{1.73_m2} (ref >59)

## 2021-12-17 LAB — CBC
Hematocrit: 45.5 % (ref 37.5–51.0)
Hemoglobin: 15.4 g/dL (ref 13.0–17.7)
MCH: 30.8 pg (ref 26.6–33.0)
MCHC: 33.8 g/dL (ref 31.5–35.7)
MCV: 91 fL (ref 79–97)
Platelets: 243 10*3/uL (ref 150–450)
RBC: 5 x10E6/uL (ref 4.14–5.80)
RDW: 12.6 % (ref 11.6–15.4)
WBC: 6.3 10*3/uL (ref 3.4–10.8)

## 2021-12-17 NOTE — H&P (View-Only) (Signed)
Office Visit    Patient Name: Todd Ferguson Date of Encounter: 12/17/2021  PCP:  Jilda Panda, [place]  Cardiologist:  Minus Breeding, MD  Advanced Practice Provider:  No care team member to display Electrophysiologist:  Virl Axe, MD      Chief Complaint    Todd Ferguson is a 81 y.o. 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   Hypertension    Tobacco abuse    Past Surgical History:  Procedure Laterality Date   APPENDECTOMY     DENTAL SURGERY     Removed right eye      Allergies  Not on File  History of Present Illness    Todd Ferguson is a 81 y.o. male with a hx of bradycardia with sinus pause (evaluated by EP and not recommended fr PPM), HTN, tobacco use last seen 01/27/20 by Rosaria Ferries, PA.  Echo 01/2019 LVEF 60 to 65%, RV normal size and function, mild aortic stenosis. Atrial fibrillation noted during echo 01/2019 though not captured by monitor same month. Has not been on [place]. ETT 01/2019 no ischemic response, no chronotropic incompetence with HR increasing. Monitor 03/2019 average HR 63 bpm, runs of SB with occasional sinus pauses, PVCs, brief runs of junctional rhythm. EP evaluated and as asymptomatic no PPM recommended. Recommended avoidance of AV nodal blocking agents.   Last seen 01/2020 - shortness of breath had resolved, repeat lipids ordered, declined sleep study.   He presents today for follow up independently. Heart rate at hom has been low but cannot recall readings. Some exertional dyspnea which is stable at his baseline. He tells me that he will get a pain in his arm that radiates to his chest. Notices it after he smokes and gets up walking around. Lasts 15-20 minutes. He will feel sweaty. Notes occasional LE edema. No  orthopnea, PND. Mostly sedentary watching television during the day. He has not yet taken his morning medications. He does not have a blood pressure cuff at home to monitor.  EKGs/Labs/Other Studies Reviewed:   The following studies were reviewed today:  Monitor 03/2019 Patient had a min HR of 28 bpm, max HR of 146 bpm, and avg HR of 63 bpm. Predominant underlying rhythm was Sinus Rhythm.    Bradycardia noted frequently.   Runs of sinus bradycardia with occasional sinus pauses. Infrequent ventricular ectopy with paired PVCs Brief runs of junctional rhythm.   ETT 01/2019 There was no ST segment deviation noted during stress. No T wave inversion was noted during stress.   No ischemic response Frequent PVC;s but no NSVT No chronotropic incompetence with HR increasing From 78 to 130 bpm which was 89% PMHR   Echo 01/2019  1. The left ventricle has normal systolic function with an ejection  fraction of 60-65%. The cavity size was normal. Left ventricular diastolic  function could not be evaluated secondary to atrial fibrillation.   2. The right ventricle has normal systolic function. The cavity was  normal. There is no increase in right ventricular wall thickness. Right  ventricular systolic pressure could not be assessed.   3. Mild calcification of the anterior mitral valve leaflet.   4. The aortic valve is tricuspid. Moderate calcification  of the aortic  valve. Aortic valve regurgitation was not assessed by color flow Doppler.  Mild stenosis of the aortic valve.   5. The aorta is normal unless otherwise noted.   6. The inferior vena cava was dilated in size with >50% respiratory  variability.   EKG:  EKG is ordered today.  The ekg ordered today demonstrates SB 55 bpm with new TWI V1-V5.   Recent Labs: No results found for requested labs within last 365 days.  Recent Lipid Panel    Component Value Date/Time   CHOL 189 01/27/2020 1623   TRIG 85 01/27/2020 1623   HDL 50  01/27/2020 1623   CHOLHDL 3.8 01/27/2020 1623   CHOLHDL 3.4 07/18/2016 0335   VLDL 12 07/18/2016 0335   LDLCALC 123 (H) 01/27/2020 1623    Home Medications   Current Meds  Medication Sig   aspirin EC 81 MG tablet Take 1 tablet (81 mg total) by mouth daily.   atorvastatin (LIPITOR) 20 MG tablet Take 20 mg by mouth daily.   brimonidine (ALPHAGAN) 0.15 % ophthalmic solution Place 1 drop into the left eye daily.   dorzolamide-timolol (COSOPT) 22.3-6.8 MG/ML ophthalmic solution Place 1 drop into the left eye daily.   hydrochlorothiazide (MICROZIDE) 12.5 MG capsule TAKE 1 CAPSULE (12.5 MG TOTAL) BY MOUTH DAILY.   losartan (COZAAR) 100 MG tablet Take 100 mg by mouth daily.   ROCKLATAN 0.02-0.005 % SOLN Apply 1 drop to eye daily.     Review of Systems      All other systems reviewed and are otherwise negative except as noted above.  Physical Exam    VS:  BP (!) 148/92 (BP Location: Right Arm, Patient Position: Sitting, Cuff Size: Normal)   Pulse (!) 55   Ht '5\' 4"'$  (1.626 m)   Wt 201 lb 6.4 oz (91.4 kg)   BMI 34.57 kg/m  , BMI Body mass index is 34.57 kg/m.  Wt Readings from Last 3 Encounters:  12/17/21 201 lb 6.4 oz (91.4 kg)  01/27/20 207 lb 12.8 oz (94.3 kg)  04/27/19 211 lb (95.7 kg)     GEN: Well nourished, well developed, in no acute distress. HEENT: normal. Neck: Supple, no JVD, carotid bruits, or masses. Cardiac: RRR, no murmurs, rubs, or gallops. No clubbing, cyanosis, edema.  Radials/PT 2+ and equal bilaterally.  Respiratory:  Respirations regular and unlabored, clear to auscultation bilaterally. GI: Soft, nontender, nondistended. MS: No deformity or atrophy. Skin: Warm and dry, no rash. Neuro:  Strength and sensation are intact. Psych: Normal affect.  Assessment & Plan    Chest pain in adult -left arm pain that radiates to chest with exertion. Exertional dyspnea which is stable at his baseline.  EKG today with new T wave inversion V1-V6.  Concern for ischemia.   Recommended for cardiac catheterization. Given dyspnea, edema plan for right and left heart cath.  Shared Decision Making/Informed Consent{ The risks [stroke (1 in 1000), death (1 in 1000), kidney failure [usually temporary] (1 in 500), bleeding (1 in 200), allergic reaction [possibly serious] (1 in 200)], benefits (diagnostic support and management of coronary artery disease) and alternatives of a cardiac catheterization were discussed in detail with Mr. Florene Glen and he is willing to proceed.   Hypertension -BP above goal but did not take his medications yet today.  We will continue current medications Including hydrochlorothiazide 12.5 mg daily, losartan 100 mg daily.  If BP remains uncontrolled despite medications transition losartan to valsartan..  Encouraged him to purchase BP  cuff for home monitoring.  HLD - Continue atorvastatin 20 mg daily.  If coronary artery disease noted by cath anticipate increasing dose.  Bradycardia -prior monitor with bradycardia and pauses but no symptomatic.  Evaluated by EP who did not recommend PPM.  He has previously declined sleep study.  Avoid AV nodal blocking agents.  HYPERTENSION CONTROL Vitals:   12/17/21 0814 12/17/21 0820  BP: (!) 152/96 (!) 148/92    The patient's blood pressure is elevated above target today.  The following intervention was performed to address the patient's elevated BP:  - Blood pressure will be monitored at home to determine if medication changes need to be made. (Patient did not take morning medications)       Disposition: Follow up  2 weeks after cardiac catheterization  with Minus Breeding, MD or APP.  Signed, Loel Dubonnet, NP 12/17/2021, 4:51 PM [facility]

## 2021-12-17 NOTE — Patient Instructions (Addendum)
Medication Instructions:  Your Physician recommend you continue on your current medication as directed.    *If you need a refill on your cardiac medications before your next appointment, please call your pharmacy*   Lab Work: Your physician recommends that you return for lab work today- BMP/CBC   If you have labs (blood work) drawn today and your tests are completely normal, you will receive your results only by: MyChart Message (if you have MyChart) OR A paper copy in the mail If you have any lab test that is abnormal or we need to change your treatment, we will call you to review the results.   Testing/Procedures: Your physician has requested that you have a cardiac catheterization. Cardiac catheterization is used to diagnose and/or treat various heart conditions. Doctors may recommend this procedure for a number of different reasons. The most common reason is to evaluate chest pain. Chest pain can be a symptom of coronary artery disease (CAD), and cardiac catheterization can show whether plaque is narrowing or blocking your heart's arteries. This procedure is also used to evaluate the valves, as well as measure the blood flow and oxygen levels in different parts of your heart. For further information please visit [email or web address]. Please follow instruction sheet, as given.    Follow-Up: At [facility], you and your health needs are our priority.  As part of our continuing mission to provide you with exceptional heart care, we have created designated Provider Care Teams.  These Care Teams include your primary Cardiologist (physician) and Advanced Practice Providers (APPs -  Physician Assistants and Nurse Practitioners) who all work together to provide you with the care you need, when you need it.  We recommend signing up for the patient portal called "MyChart".  Sign up information is provided on this After Visit Summary.  MyChart is used to connect with patients for Virtual Visits  (Telemedicine).  Patients are able to view lab/test results, encounter notes, upcoming appointments, etc.  Non-urgent messages can be sent to your provider as well.   To learn more about what you can do with MyChart, go to [email or web address].    Your next appointment:   2 week(s)  The format for your next appointment:   In Person  Provider:   Minus Breeding, MD  or Laurann Montana, NP{  Other Instructions  You are scheduled for a Cardiac Catheterization on Thursday, August 3 with Dr. Sherren Mocha.  1. Please arrive at the Main Entrance A at [facility]: [place], [place] [address] at 10:00 AM (This time is two hours before your procedure to ensure your preparation). Free valet parking service is available.   Special note: Every effort is made to have your procedure done on time. Please understand that emergencies sometimes delay scheduled procedures.  2. Diet: Do not eat solid foods after midnight.  You may have clear liquids until 5 AM upon the day of the procedure.  3. Labs: labs done 8/1 at office visit   4. Medication instructions in preparation for your procedure:  HOLD HYDROCHLOROTHIAZIDE AM OF PROCEDURE  On the morning of your procedure, take Aspirin '81mg'$  and any morning medicines NOT listed above.  You may use sips of water.  5. Plan to go home the same day, you will only stay overnight if medically necessary. 6. You MUST have a responsible adult to drive you home. 7. An adult MUST be with you the first 24 hours after you arrive home. 8. Bring a current list  of your medications, and the last time and date medication taken. 9. Bring ID and current insurance cards. 10.Please wear clothes that are easy to get on and off and wear slip-on shoes.  Thank you for allowing [place] to care for you!   -- Todd [facility]

## 2021-12-17 NOTE — Progress Notes (Addendum)
Office Visit    Patient Name: Todd Ferguson Date of Encounter: 12/20/2021  PCP:  Jilda Panda, [facility]  Cardiologist:  Minus Breeding, MD  Advanced Practice Provider:  Loel Dubonnet, NP Electrophysiologist:  Virl Axe, MD      Chief Complaint    Todd Ferguson is a 81 y.o. male presents today for shortness of breath, chest pain  Past Medical History    Past Medical History:  Diagnosis Date   Bradycardia    CAD S/P percutaneous coronary angioplasty    12/19/21: 3.5 x 24 mm synergy DES to proximal-mid LAD, residual disease inteh RCA, PDA, LCX, and intermediate branches   Cataract    had one on his right eye, per pt they removed his right eye because of the cateract.   Hyperlipidemia with target LDL less than 70    Hypertension    Sinus pause    3.9-4.5 sec documented, asymptomatic   Tobacco abuse    Past Surgical History:  Procedure Laterality Date   APPENDECTOMY     CORONARY STENT INTERVENTION N/A 12/19/2021   Procedure: CORONARY STENT INTERVENTION;  Surgeon: Sherren Mocha, MD;  Location: [facility];  Service: Cardiovascular;  Laterality: N/A;   DENTAL SURGERY     Removed right eye     RIGHT/LEFT HEART CATH AND CORONARY ANGIOGRAPHY N/A 12/19/2021   Procedure: RIGHT/LEFT HEART CATH AND CORONARY ANGIOGRAPHY;  Surgeon: Sherren Mocha, MD;  Location: [facility];  Service: Cardiovascular;  Laterality: N/A;    Allergies  No Known Allergies  History of Present Illness    Todd Ferguson is a 81 y.o. male with a hx of bradycardia with sinus pause (evaluated by EP and not recommended fr PPM), HTN, tobacco use last seen 01/27/20 by Rosaria Ferries, PA.  Echo 01/2019 LVEF 60 to 65%, RV normal size and function, mild aortic stenosis. Atrial fibrillation noted during echo 01/2019 though not captured by monitor same month. Has not been on [place]. ETT 01/2019 no ischemic response, no chronotropic incompetence with HR  increasing. Monitor 03/2019 average HR 63 bpm, runs of SB with occasional sinus pauses, PVCs, brief runs of junctional rhythm. EP evaluated and as asymptomatic no PPM recommended. Recommended avoidance of AV nodal blocking agents.   Last seen 01/2020 - shortness of breath had resolved, repeat lipids ordered, declined sleep study.   He presents today for follow up independently. Heart rate at hom has been low but cannot recall readings. Some exertional dyspnea which is stable at his baseline. He tells me that he will get a pain in his arm that radiates to his chest. Notices it after he smokes and gets up walking around. Lasts 15-20 minutes. He will feel sweaty. Notes occasional LE edema. No orthopnea, PND. Mostly sedentary watching television during the day. He has not yet taken his morning medications. He does not have a blood pressure cuff at home to monitor.  EKGs/Labs/Other Studies Reviewed:   The following studies were reviewed today:  Monitor 03/2019 Patient had a min HR of 28 bpm, max HR of 146 bpm, and avg HR of 63 bpm. Predominant underlying rhythm was Sinus Rhythm.    Bradycardia noted frequently.   Runs of sinus bradycardia with occasional sinus pauses. Infrequent ventricular ectopy with paired PVCs Brief runs of junctional rhythm.   ETT 01/2019 There was no ST segment deviation noted during stress. No T wave inversion was noted during stress.   No  ischemic response Frequent PVC;s but no NSVT No chronotropic incompetence with HR increasing From 78 to 130 bpm which was 89% PMHR   Echo 01/2019  1. The left ventricle has normal systolic function with an ejection  fraction of 60-65%. The cavity size was normal. Left ventricular diastolic  function could not be evaluated secondary to atrial fibrillation.   2. The right ventricle has normal systolic function. The cavity was  normal. There is no increase in right ventricular wall thickness. Right  ventricular systolic pressure could  not be assessed.   3. Mild calcification of the anterior mitral valve leaflet.   4. The aortic valve is tricuspid. Moderate calcification of the aortic  valve. Aortic valve regurgitation was not assessed by color flow Doppler.  Mild stenosis of the aortic valve.   5. The aorta is normal unless otherwise noted.   6. The inferior vena cava was dilated in size with >50% respiratory  variability.   EKG:  EKG is ordered today.  The ekg ordered today demonstrates SB 55 bpm with new TWI V1-V5.   Recent Labs: 12/20/2021: BUN 12; Creatinine, Ser 1.07; Hemoglobin 14.2; Platelets 229; Potassium 4.3; Sodium 137  Recent Lipid Panel    Component Value Date/Time   CHOL 127 12/20/2021 0409   CHOL 189 01/27/2020 1623   TRIG 101 12/20/2021 0409   HDL 40 (L) 12/20/2021 0409   HDL 50 01/27/2020 1623   CHOLHDL 3.2 12/20/2021 0409   VLDL 20 12/20/2021 0409   LDLCALC 67 12/20/2021 0409   LDLCALC 123 (H) 01/27/2020 1623    Home Medications   Current Meds  Medication Sig   aspirin EC 81 MG tablet Take 1 tablet (81 mg total) by mouth daily.   atorvastatin (LIPITOR) 20 MG tablet Take 20 mg by mouth daily.   brimonidine (ALPHAGAN) 0.15 % ophthalmic solution Place 1 drop into the left eye daily.   dorzolamide-timolol (COSOPT) 22.3-6.8 MG/ML ophthalmic solution Place 1 drop into the left eye daily.   losartan (COZAAR) 100 MG tablet Take 100 mg by mouth daily.   ROCKLATAN 0.02-0.005 % SOLN Place 1 drop into the left eye at bedtime.   [DISCONTINUED] hydrochlorothiazide (MICROZIDE) 12.5 MG capsule TAKE 1 CAPSULE (12.5 MG TOTAL) BY MOUTH DAILY.     Review of Systems      All other systems reviewed and are otherwise negative except as noted above.  Physical Exam    VS:  BP (!) 148/92 (BP Location: Right Arm, Patient Position: Sitting, Cuff Size: Normal)   Pulse (!) 55   Ht '5\' 4"'$  (1.626 m)   Wt 201 lb 6.4 oz (91.4 kg)   BMI 34.57 kg/m  , BMI Body mass index is 34.57 kg/m.  Wt Readings from Last 3  Encounters:  12/19/21 197 lb 11.2 oz (89.7 kg)  12/17/21 201 lb 6.4 oz (91.4 kg)  01/27/20 207 lb 12.8 oz (94.3 kg)     GEN: Well nourished, well developed, in no acute distress. HEENT: normal. Neck: Supple, no JVD, carotid bruits, or masses. Cardiac: RRR, no murmurs, rubs, or gallops. No clubbing, cyanosis, edema.  Radials/PT 2+ and equal bilaterally.  Respiratory:  Respirations regular and unlabored, clear to auscultation bilaterally. GI: Soft, nontender, nondistended. MS: No deformity or atrophy. Skin: Warm and dry, no rash. Neuro:  Strength and sensation are intact. Psych: Normal affect.  Assessment & Plan    Chest pain in adult -left arm pain that radiates to chest with exertion. Exertional dyspnea which is stable at  his baseline.  EKG today with new T wave inversion V1-V6.  Concern for ischemia.  Recommended for cardiac catheterization. Given dyspnea, edema plan for right and left heart cath.  Shared Decision Making/Informed Consent{ The risks [stroke (1 in 1000), death (1 in 1000), kidney failure [usually temporary] (1 in 500), bleeding (1 in 200), allergic reaction [possibly serious] (1 in 200)], benefits (diagnostic support and management of coronary artery disease) and alternatives of a cardiac catheterization were discussed in detail with Mr. Florene Glen and he is willing to proceed.   Hypertension -BP above goal but did not take his medications yet today.  We will continue current medications Including hydrochlorothiazide 12.5 mg daily, losartan 100 mg daily.  If BP remains uncontrolled despite medications transition losartan to valsartan..  Encouraged him to purchase BP cuff for home monitoring.  HLD - Continue atorvastatin 20 mg daily.  If coronary artery disease noted by cath anticipate increasing dose.  Bradycardia -prior monitor with bradycardia and pauses but no symptomatic.  Evaluated by EP who did not recommend PPM.  He has previously declined sleep study.  Avoid AV nodal  blocking agents.       Disposition: Follow up  2 weeks after cardiac catheterization  with Minus Breeding, MD or APP.  Signed, Loel Dubonnet, NP 12/20/2021, 10:02 AM [place]

## 2021-12-18 ENCOUNTER — Telehealth: Payer: Self-pay | Admitting: *Deleted

## 2021-12-18 NOTE — Telephone Encounter (Signed)
Cardiac Catheterization scheduled at [facility] for: Thursday December 19, 2021 12 Noon Arrival time and place: [facility] Main Entrance A at: 10 AM   Nothing to eat after midnight prior to procedure, clear liquids until 5 AM day of procedure.  Medication instructions: -Hold:  HCTZ-AM of procedure  -Except hold medications usual morning medications can be taken with sips of water including aspirin 81 mg.  Confirmed patient has responsible adult to drive home post procedure and be with patient first 24 hours after arriving home.   Reviewed procedure instructions with patient.

## 2021-12-19 ENCOUNTER — Other Ambulatory Visit: Payer: Self-pay

## 2021-12-19 ENCOUNTER — Encounter (HOSPITAL_COMMUNITY)
Admission: RE | Disposition: A | Discharge status: Home / Self Care | Payer: Self-pay | Source: Home / Self Care | Attending: Cardiovascular Disease

## 2021-12-19 ENCOUNTER — Ambulatory Visit (HOSPITAL_COMMUNITY)
Admission: RE | Admit: 2021-12-19 | Discharge: 2021-12-20 | Disposition: A | Discharge status: Home / Self Care | Payer: Medicare Other | Attending: Cardiovascular Disease | Admitting: Cardiovascular Disease

## 2021-12-19 DIAGNOSIS — F17200 Nicotine dependence, unspecified, uncomplicated: Secondary | ICD-10-CM | POA: Diagnosis not present

## 2021-12-19 DIAGNOSIS — I10 Essential (primary) hypertension: Secondary | ICD-10-CM | POA: Diagnosis not present

## 2021-12-19 DIAGNOSIS — I25110 Atherosclerotic heart disease of native coronary artery with unstable angina pectoris: Secondary | ICD-10-CM

## 2021-12-19 DIAGNOSIS — I2510 Atherosclerotic heart disease of native coronary artery without angina pectoris: Secondary | ICD-10-CM | POA: Diagnosis not present

## 2021-12-19 DIAGNOSIS — E785 Hyperlipidemia, unspecified: Secondary | ICD-10-CM | POA: Insufficient documentation

## 2021-12-19 DIAGNOSIS — R7303 Prediabetes: Secondary | ICD-10-CM | POA: Insufficient documentation

## 2021-12-19 DIAGNOSIS — Z79899 Other long term (current) drug therapy: Secondary | ICD-10-CM | POA: Diagnosis not present

## 2021-12-19 DIAGNOSIS — R001 Bradycardia, unspecified: Secondary | ICD-10-CM | POA: Insufficient documentation

## 2021-12-19 DIAGNOSIS — I209 Angina pectoris, unspecified: Secondary | ICD-10-CM

## 2021-12-19 DIAGNOSIS — R0609 Other forms of dyspnea: Secondary | ICD-10-CM

## 2021-12-19 DIAGNOSIS — I200 Unstable angina: Secondary | ICD-10-CM | POA: Diagnosis present

## 2021-12-19 DIAGNOSIS — Z955 Presence of coronary angioplasty implant and graft: Secondary | ICD-10-CM

## 2021-12-19 HISTORY — PX: RIGHT/LEFT HEART CATH AND CORONARY ANGIOGRAPHY: CATH118266

## 2021-12-19 HISTORY — DX: Hyperlipidemia, unspecified: E78.5

## 2021-12-19 HISTORY — DX: Atherosclerotic heart disease of native coronary artery without angina pectoris: I25.10

## 2021-12-19 HISTORY — DX: Other specified heart block: I45.5

## 2021-12-19 HISTORY — PX: CORONARY STENT INTERVENTION: CATH118234

## 2021-12-19 LAB — POCT ACTIVATED CLOTTING TIME: Activated Clotting Time: 305 seconds

## 2021-12-19 LAB — POCT I-STAT EG7
Acid-base deficit: 1 mmol/L (ref 0.0–2.0)
Bicarbonate: 26 mmol/L (ref 20.0–28.0)
Calcium, Ion: 1.29 mmol/L (ref 1.15–1.40)
HCT: 42 % (ref 39.0–52.0)
Hemoglobin: 14.3 g/dL (ref 13.0–17.0)
O2 Saturation: 77 %
Potassium: 3.7 mmol/L (ref 3.5–5.1)
Sodium: 142 mmol/L (ref 135–145)
TCO2: 27 mmol/L (ref 22–32)
pCO2, Ven: 49.2 mmHg (ref 44–60)
pH, Ven: 7.331 (ref 7.25–7.43)
pO2, Ven: 45 mmHg (ref 32–45)

## 2021-12-19 LAB — POCT I-STAT 7, (LYTES, BLD GAS, ICA,H+H)
Acid-base deficit: 1 mmol/L (ref 0.0–2.0)
Bicarbonate: 24.8 mmol/L (ref 20.0–28.0)
Calcium, Ion: 1.27 mmol/L (ref 1.15–1.40)
HCT: 42 % (ref 39.0–52.0)
Hemoglobin: 14.3 g/dL (ref 13.0–17.0)
O2 Saturation: 98 %
Potassium: 3.7 mmol/L (ref 3.5–5.1)
Sodium: 141 mmol/L (ref 135–145)
TCO2: 26 mmol/L (ref 22–32)
pCO2 arterial: 45 mmHg (ref 32–48)
pH, Arterial: 7.349 — ABNORMAL LOW (ref 7.35–7.45)
pO2, Arterial: 105 mmHg (ref 83–108)

## 2021-12-19 SURGERY — RIGHT/LEFT HEART CATH AND CORONARY ANGIOGRAPHY
Anesthesia: LOCAL

## 2021-12-19 MED ORDER — LIDOCAINE HCL (PF) 1 % IJ SOLN
INTRAMUSCULAR | Status: AC
  Filled 2021-12-19: qty 30

## 2021-12-19 MED ORDER — SODIUM CHLORIDE 0.9% FLUSH: 3.0000 mL | INTRAVENOUS | Status: DC | PRN

## 2021-12-19 MED ORDER — LOSARTAN POTASSIUM 50 MG PO TABS
100.0000 mg | ORAL_TABLET | Freq: Every day | ORAL | Status: DC
  Administered 2021-12-20: 100 mg via ORAL
  Filled 2021-12-19 (×2): qty 2

## 2021-12-19 MED ORDER — SODIUM CHLORIDE 0.9 % IV SOLN: 250.0000 mL | INTRAVENOUS | Status: DC | PRN

## 2021-12-19 MED ORDER — ASPIRIN 81 MG PO CHEW: 81.0000 mg | CHEWABLE_TABLET | ORAL | Status: DC

## 2021-12-19 MED ORDER — FENTANYL CITRATE (PF) 100 MCG/2ML IJ SOLN
INTRAMUSCULAR | Status: DC | PRN
  Administered 2021-12-19: 25 ug via INTRAVENOUS

## 2021-12-19 MED ORDER — SODIUM CHLORIDE 0.9% FLUSH: 3.0000 mL | Freq: Two times a day (BID) | INTRAVENOUS | Status: DC

## 2021-12-19 MED ORDER — SODIUM CHLORIDE 0.9 % WEIGHT BASED INFUSION: 1.0000 mL/kg/h | INTRAVENOUS | Status: DC

## 2021-12-19 MED ORDER — VERAPAMIL HCL 2.5 MG/ML IV SOLN
INTRAVENOUS | Status: DC | PRN
  Administered 2021-12-19: 10 mL via INTRA_ARTERIAL

## 2021-12-19 MED ORDER — HYDRALAZINE HCL 20 MG/ML IJ SOLN
INTRAMUSCULAR | Status: AC
  Filled 2021-12-19: qty 1

## 2021-12-19 MED ORDER — ATORVASTATIN CALCIUM 80 MG PO TABS
80.0000 mg | ORAL_TABLET | Freq: Every day | ORAL | Status: DC
  Administered 2021-12-20: 80 mg via ORAL
  Filled 2021-12-19 (×2): qty 1

## 2021-12-19 MED ORDER — SODIUM CHLORIDE 0.9% FLUSH
3.0000 mL | Freq: Two times a day (BID) | INTRAVENOUS | Status: DC
Start: 2021-12-19 — End: 2021-12-20

## 2021-12-19 MED ORDER — ASPIRIN 81 MG PO TBEC
81.0000 mg | DELAYED_RELEASE_TABLET | Freq: Every day | ORAL | Status: DC
  Administered 2021-12-20: 81 mg via ORAL
  Filled 2021-12-19 (×2): qty 1

## 2021-12-19 MED ORDER — NETARSUDIL-LATANOPROST 0.02-0.005 % OP SOLN: 1.0000 [drp] | Freq: Every day | OPHTHALMIC | Status: DC

## 2021-12-19 MED ORDER — ONDANSETRON HCL 4 MG/2ML IJ SOLN: 4.0000 mg | Freq: Four times a day (QID) | INTRAMUSCULAR | Status: DC | PRN

## 2021-12-19 MED ORDER — HEPARIN SODIUM (PORCINE) 1000 UNIT/ML IJ SOLN
INTRAMUSCULAR | Status: DC | PRN
  Administered 2021-12-19: 5000 [IU] via INTRAVENOUS
  Administered 2021-12-19: 4500 [IU] via INTRAVENOUS

## 2021-12-19 MED ORDER — HEPARIN SODIUM (PORCINE) 1000 UNIT/ML IJ SOLN
INTRAMUSCULAR | Status: AC
  Filled 2021-12-19: qty 10

## 2021-12-19 MED ORDER — LABETALOL HCL 5 MG/ML IV SOLN: 10.0000 mg | INTRAVENOUS | Status: AC | PRN

## 2021-12-19 MED ORDER — VERAPAMIL HCL 2.5 MG/ML IV SOLN
INTRAVENOUS | Status: AC
  Filled 2021-12-19: qty 2

## 2021-12-19 MED ORDER — NITROGLYCERIN 1 MG/10 ML FOR IR/CATH LAB
INTRA_ARTERIAL | Status: DC | PRN
  Administered 2021-12-19 (×3): 200 ug via INTRACORONARY

## 2021-12-19 MED ORDER — ACETAMINOPHEN 325 MG PO TABS: 650.0000 mg | ORAL_TABLET | ORAL | Status: DC | PRN

## 2021-12-19 MED ORDER — HEPARIN (PORCINE) IN NACL 1000-0.9 UT/500ML-% IV SOLN
INTRAVENOUS | Status: AC
Start: 2021-12-19 — End: ?
  Filled 2021-12-19: qty 1000

## 2021-12-19 MED ORDER — NITROGLYCERIN 1 MG/10 ML FOR IR/CATH LAB
INTRA_ARTERIAL | Status: AC
  Filled 2021-12-19: qty 10

## 2021-12-19 MED ORDER — HEPARIN (PORCINE) IN NACL 1000-0.9 UT/500ML-% IV SOLN
INTRAVENOUS | Status: DC | PRN
  Administered 2021-12-19 (×2): 500 mL

## 2021-12-19 MED ORDER — HYDROCHLOROTHIAZIDE 12.5 MG PO TABS
12.5000 mg | ORAL_TABLET | Freq: Every day | ORAL | Status: DC
  Administered 2021-12-19 – 2021-12-20 (×2): 12.5 mg via ORAL
  Filled 2021-12-19 (×4): qty 1

## 2021-12-19 MED ORDER — MIDAZOLAM HCL 2 MG/2ML IJ SOLN
INTRAMUSCULAR | Status: DC | PRN
  Administered 2021-12-19: 2 mg via INTRAVENOUS

## 2021-12-19 MED ORDER — SODIUM CHLORIDE 0.9 % WEIGHT BASED INFUSION
3.0000 mL/kg/h | INTRAVENOUS | Status: DC
  Administered 2021-12-19: 3 mL/kg/h via INTRAVENOUS

## 2021-12-19 MED ORDER — IOHEXOL 350 MG/ML SOLN
INTRAVENOUS | Status: DC | PRN
  Administered 2021-12-19: 100 mL

## 2021-12-19 MED ORDER — TICAGRELOR 90 MG PO TABS
ORAL_TABLET | ORAL | Status: DC | PRN
  Administered 2021-12-19: 180 mg via ORAL

## 2021-12-19 MED ORDER — HYDRALAZINE HCL 20 MG/ML IJ SOLN: 10.0000 mg | INTRAMUSCULAR | Status: AC | PRN

## 2021-12-19 MED ORDER — DORZOLAMIDE HCL-TIMOLOL MAL 2-0.5 % OP SOLN
1.0000 [drp] | Freq: Every day | OPHTHALMIC | Status: DC
Start: 2021-12-19 — End: 2021-12-20
  Administered 2021-12-20: 1 [drp] via OPHTHALMIC
  Filled 2021-12-19: qty 10

## 2021-12-19 MED ORDER — MIDAZOLAM HCL 2 MG/2ML IJ SOLN
INTRAMUSCULAR | Status: AC
  Filled 2021-12-19: qty 2

## 2021-12-19 MED ORDER — TICAGRELOR 90 MG PO TABS
ORAL_TABLET | ORAL | Status: AC
Start: 2021-12-19 — End: ?
  Filled 2021-12-19: qty 2

## 2021-12-19 MED ORDER — SODIUM CHLORIDE 0.9 % IV SOLN
INTRAVENOUS | Status: AC
  Administered 2021-12-19: 100 mL/h via INTRAVENOUS

## 2021-12-19 MED ORDER — MORPHINE SULFATE (PF) 2 MG/ML IV SOLN: 2.0000 mg | INTRAVENOUS | Status: DC | PRN

## 2021-12-19 MED ORDER — TICAGRELOR 90 MG PO TABS
90.0000 mg | ORAL_TABLET | Freq: Two times a day (BID) | ORAL | Status: DC
Start: 2021-12-20 — End: 2021-12-20
  Administered 2021-12-20: 90 mg via ORAL
  Filled 2021-12-19: qty 1

## 2021-12-19 MED ORDER — LIDOCAINE HCL (PF) 1 % IJ SOLN
INTRAMUSCULAR | Status: DC | PRN
  Administered 2021-12-19 (×2): 2 mL

## 2021-12-19 MED ORDER — FENTANYL CITRATE (PF) 100 MCG/2ML IJ SOLN
INTRAMUSCULAR | Status: AC
  Filled 2021-12-19: qty 2

## 2021-12-19 MED ORDER — BRIMONIDINE TARTRATE 0.15 % OP SOLN
1.0000 [drp] | Freq: Every day | OPHTHALMIC | Status: DC
Start: 2021-12-19 — End: 2021-12-20
  Administered 2021-12-20: 1 [drp] via OPHTHALMIC
  Filled 2021-12-19: qty 5

## 2021-12-19 MED ORDER — OXYCODONE HCL 5 MG PO TABS: 5.0000 mg | ORAL_TABLET | ORAL | Status: DC | PRN

## 2021-12-19 MED ORDER — HYDRALAZINE HCL 20 MG/ML IJ SOLN
INTRAMUSCULAR | Status: DC | PRN
  Administered 2021-12-19 (×2): 10 mg via INTRAVENOUS

## 2021-12-19 SURGICAL SUPPLY — 24 items
BALL SAPPHIRE NC24 3.75X10 (BALLOONS) ×2
BALLN SAPPHIRE 2.5X15 (BALLOONS) ×2
BALLOON SAPPHIRE 2.5X15 (BALLOONS) IMPLANT
BALLOON SAPPHIRE NC24 3.75X10 (BALLOONS) IMPLANT
BAND CMPR LRG ZPHR (HEMOSTASIS) ×2
BAND ZEPHYR COMPRESS 30 LONG (HEMOSTASIS) ×2 IMPLANT
CATH 5FR JL3.5 JR4 ANG PIG MP (CATHETERS) ×1 IMPLANT
CATH BALLN WEDGE 5F 110CM (CATHETERS) ×1 IMPLANT
CATH LAUNCHER 5F EBU3.0 (CATHETERS) IMPLANT
CATH LAUNCHER 5F EBU3.5 (CATHETERS) ×1 IMPLANT
CATHETER LAUNCHER 5F EBU3.0 (CATHETERS) ×2
GLIDESHEATH SLEND SS 6F .021 (SHEATH) ×1 IMPLANT
GUIDEWIRE INQWIRE 1.5J.035X260 (WIRE) IMPLANT
INQWIRE 1.5J .035X260CM (WIRE) ×2
KIT ENCORE 26 ADVANTAGE (KITS) ×1 IMPLANT
KIT HEART LEFT (KITS) ×2 IMPLANT
PACK CARDIAC CATHETERIZATION (CUSTOM PROCEDURE TRAY) ×2 IMPLANT
SHEATH GLIDE SLENDER 4/5FR (SHEATH) ×1 IMPLANT
STENT SYNERGY XD 3.50X24 (Permanent Stent) IMPLANT
SYNERGY XD 3.50X24 (Permanent Stent) ×2 IMPLANT
TRANSDUCER W/STOPCOCK (MISCELLANEOUS) ×2 IMPLANT
TUBING CIL FLEX 10 FLL-RA (TUBING) ×2 IMPLANT
VALVE GUARDIAN II ~~LOC~~ HEMO (MISCELLANEOUS) ×1 IMPLANT
WIRE COUGAR XT STRL 190CM (WIRE) ×1 IMPLANT

## 2021-12-19 NOTE — Interval H&P Note (Signed)
History and Physical Interval Note:  12/19/2021 4:14 PM  Todd Ferguson  has presented today for surgery, with the diagnosis of chest pain.  The various methods of treatment have been discussed with the patient and family. After consideration of risks, benefits and other options for treatment, the patient has consented to  Procedure(s): RIGHT/LEFT HEART CATH AND CORONARY ANGIOGRAPHY (N/A) as a surgical intervention.  The patient's history has been reviewed, patient examined, no change in status, stable for surgery.  I have reviewed the patient's chart and labs.  Questions were answered to the patient's satisfaction.     Sherren Mocha

## 2021-12-19 NOTE — Progress Notes (Signed)
Pt had a 2.32 second pause, asymptomatic (no pain or shortness of breath), strip saved on telemetry, [place] paged. Will continue to monitor.  Elaina Hoops, RN

## 2021-12-19 NOTE — Progress Notes (Signed)
TR BAND REMOVAL  LOCATION:    Right radial  DEFLATED PER PROTOCOL:    Yes.    TIME BAND OFF / DRESSING APPLIED:    2100 a clean dry dressing applied with gauze and teagderm   SITE UPON ARRIVAL:    Level 0  SITE AFTER BAND REMOVAL:    Level 0  CIRCULATION SENSATION AND MOVEMENT:    Within Normal Limits   Yes.    COMMENTS:   Care instructions given to patient.

## 2021-12-20 ENCOUNTER — Telehealth (HOSPITAL_COMMUNITY): Payer: Self-pay | Admitting: Pharmacy Technician

## 2021-12-20 ENCOUNTER — Encounter (HOSPITAL_COMMUNITY): Payer: Self-pay | Admitting: Cardiovascular Disease

## 2021-12-20 ENCOUNTER — Other Ambulatory Visit (HOSPITAL_COMMUNITY): Payer: Self-pay

## 2021-12-20 DIAGNOSIS — I200 Unstable angina: Secondary | ICD-10-CM

## 2021-12-20 DIAGNOSIS — I2510 Atherosclerotic heart disease of native coronary artery without angina pectoris: Secondary | ICD-10-CM

## 2021-12-20 DIAGNOSIS — E785 Hyperlipidemia, unspecified: Secondary | ICD-10-CM | POA: Diagnosis not present

## 2021-12-20 DIAGNOSIS — I10 Essential (primary) hypertension: Secondary | ICD-10-CM | POA: Diagnosis not present

## 2021-12-20 DIAGNOSIS — R001 Bradycardia, unspecified: Secondary | ICD-10-CM | POA: Diagnosis not present

## 2021-12-20 LAB — CBC
HCT: 41.4 % (ref 39.0–52.0)
Hemoglobin: 14.2 g/dL (ref 13.0–17.0)
MCH: 31.1 pg (ref 26.0–34.0)
MCHC: 34.3 g/dL (ref 30.0–36.0)
MCV: 90.8 fL (ref 80.0–100.0)
Platelets: 229 K/uL (ref 150–400)
RBC: 4.56 MIL/uL (ref 4.22–5.81)
RDW: 12.9 % (ref 11.5–15.5)
WBC: 7 K/uL (ref 4.0–10.5)
nRBC: 0 % (ref 0.0–0.2)

## 2021-12-20 LAB — LIPID PANEL
Cholesterol: 127 mg/dL (ref 0–200)
HDL: 40 mg/dL — ABNORMAL LOW (ref >40)
LDL Cholesterol: 67 mg/dL (ref 0–99)
Total CHOL/HDL Ratio: 3.2 RATIO
Triglycerides: 101 mg/dL (ref <150)
VLDL: 20 mg/dL (ref 0–40)

## 2021-12-20 LAB — BASIC METABOLIC PANEL WITH GFR
Anion gap: 6 (ref 5–15)
BUN: 12 mg/dL (ref 8–23)
CO2: 24 mmol/L (ref 22–32)
Calcium: 9.4 mg/dL (ref 8.9–10.3)
Chloride: 107 mmol/L (ref 98–111)
Creatinine, Ser: 1.07 mg/dL (ref 0.61–1.24)
GFR, Estimated: >60 mL/min
Glucose, Bld: 93 mg/dL (ref 70–99)
Potassium: 4.3 mmol/L (ref 3.5–5.1)
Sodium: 137 mmol/L (ref 135–145)

## 2021-12-20 MED ORDER — HYDROCHLOROTHIAZIDE 12.5 MG PO TABS
12.5000 mg | ORAL_TABLET | Freq: Every day | ORAL | 3 refills | Status: DC
  Filled 2021-12-20: qty 90, 90d supply, fill #0

## 2021-12-20 MED ORDER — NITROGLYCERIN 0.4 MG SL SUBL
0.4000 mg | SUBLINGUAL_TABLET | SUBLINGUAL | 3 refills | Status: AC | PRN
  Filled 2021-12-20: qty 25, 14d supply, fill #0

## 2021-12-20 MED ORDER — ATORVASTATIN CALCIUM 80 MG PO TABS
80.0000 mg | ORAL_TABLET | Freq: Every day | ORAL | 3 refills | Status: DC
  Filled 2021-12-20: qty 90, 90d supply, fill #0

## 2021-12-20 MED ORDER — TICAGRELOR 90 MG PO TABS
90.0000 mg | ORAL_TABLET | Freq: Two times a day (BID) | ORAL | 3 refills | Status: DC
  Filled 2021-12-20: qty 180, 90d supply, fill #0

## 2021-12-20 NOTE — Telephone Encounter (Signed)
Pharmacy Patient Advocate Encounter  Insurance verification completed.    The patient is insured through [employer] Medicare Part D   The patient is currently admitted and ran test claims for the following: Brilinta .  Copays and coinsurance results were relayed to Inpatient clinical team.  

## 2021-12-20 NOTE — Discharge Summary (Addendum)
Discharge Summary    Patient ID: Todd Ferguson MRN: [id number]; DOB: July 22, 1940  Admit date: 2022/01/01 Discharge date: 12/20/2021  PCP:  Jilda Panda, MD   [facility] Providers Cardiologist:  Minus Breeding, MD  Electrophysiologist:  Virl Axe, MD       Discharge Diagnoses    Principal Problem:   Accelerating angina [facility]) Active Problems:   Essential hypertension, benign   Hyperlipidemia with target LDL less than 70   Smoking   Prediabetes   Sinus bradycardia   CAD S/P percutaneous coronary angioplasty   Diagnostic Studies/Procedures    Heart cath 2022/01/01: 1.  Severe proximal LAD stenosis, treated with PCI using a 3.5 x 24 mm Synergy DES 2.  Mild diffuse plaquing in the RCA, PDA, left circumflex, and intermediate branches with severe stenosis in a small posterolateral branch of the RCA 3.  Essentially normal right heart pressures and left heart filling pressures  Recommendations: Aggressive medical therapy, overnight observation with plans for discharge tomorrow as long as no early complications arise.  Favor dual antiplatelet therapy with aspirin and ticagrelor for 12 months.  Even though this patient is done as an outpatient, his angiographic features suggest plaque rupture and I would treat him for a longer duration if tolerated. _____________   History of Present Illness     Todd Ferguson is a 81 y.o. male with with a hx of bradycardia with sinus pause (evaluated by EP and not recommended fr PPM), HTN, tobacco use. He presented to OP clinic with SOB and CP.   Echo 01/2019 LVEF 60 to 65%, RV normal size and function, mild aortic stenosis. Atrial fibrillation noted during echo 01/2019 though not captured by monitor same month. Has not been on [place]. ETT 01/2019 no ischemic response, no chronotropic incompetence with HR increasing. Monitor 03/2019 average HR 63 bpm, runs of SB with occasional sinus pauses, PVCs, brief runs of junctional rhythm. EP evaluated and as  asymptomatic no PPM recommended. Recommended avoidance of AV nodal blocking agents.    Last seen 01/2020 - shortness of breath had resolved, repeat lipids ordered, declined sleep study.    He presents today for follow up independently. Heart rate at home has been low but cannot recall readings. Some exertional dyspnea which is stable at his baseline. He tells me that he will get a pain in his arm that radiates to his chest. Notices it after he smokes and gets up walking around. Lasts 15-20 minutes. He will feel sweaty. Notes occasional LE edema. No orthopnea, PND. Mostly sedentary watching television during the day. He has not yet taken his morning medications. He does not have a blood pressure cuff at home to monitor.  Hospital Course     Consultants: none  CAD Heart catheterization revealed severe proximal LAD stenosis treated with 3.5 x 24 mm synergy DES. Residual mild diffuse plaquing in the RCA, PDA, LCX and intermediate branches with sever stenosis in the small RCA/PLA. Dr. Burt Knack favored DAPT with brilinta since angiographically  he appeared to have plaque rupture.   He tolerated the procedure and ambulated with cardiac rehab.    Sinus pauses He has been evaluated by EP and was not recommended for PPM due to being asymptomatic with bradycardia and pauses. He continued to have intermittent bradycardia with junctional escape beats and sinus pauses with longest pause 3.91 sec. In confirmed he was awake for the longest pause and was asymptomatic. In review of prior heart monitor, longest pause was 4.5 seconds. No BB.  Hyperlipidemia with LDL goal < 70 Need updated lipid panel - pending LP(a) pending Increase 20 mg lipitor to 80 mg.    Hypertension Continue losartan, HCTZ Hypertensive this morning prior to medications, now 143/80, prior to meds. If remains hypertensive, consider adding low dose amlodipine.   Pt seen and examined by Dr. Percival Spanish and deemed stable for discharge.  Follow up has been arranged.    Did the patient have an acute coronary syndrome (MI, NSTEMI, STEMI, etc) this admission?:  No                               Did the patient have a percutaneous coronary intervention (stent / angioplasty)?:  Yes.     Cath/PCI Registry Performance & Quality Measures: Aspirin prescribed? - Yes ADP Receptor Inhibitor (Plavix/Clopidogrel, Brilinta/Ticagrelor or Effient/Prasugrel) prescribed (includes medically managed patients)? - Yes High Intensity Statin (Lipitor 40-'80mg'$  or Crestor 20-'40mg'$ ) prescribed? - Yes For EF <40%, was ACEI/ARB prescribed? - Yes For EF <40%, Aldosterone Antagonist (Spironolactone or Eplerenone) prescribed? - Not Applicable (EF >/= 42%) Cardiac Rehab Phase II ordered? - Yes       The patient will be scheduled for a TOC follow up appointment in 7-14 days.  A message has been sent to the [facility] and Scheduling Pool at the office where the patient should be seen for follow up.  _____________  Discharge Vitals Blood pressure (!) 143/80, pulse 60, temperature 97.9 F (36.6 C), temperature source Oral, resp. rate 16, height '5\' 4"'$  (1.626 m), weight 89.7 kg, SpO2 98 %.  Filed Weights   12/19/21 1007 12/19/21 2123  Weight: 91.2 kg 89.7 kg    Physical Exam Constitutional:      Appearance: Normal appearance.  HENT:     Head: Normocephalic.  Eyes:     Extraocular Movements: Extraocular movements intact.  Cardiovascular:     Rate and Rhythm: Normal rate and regular rhythm.     Heart sounds: No murmur heard. Pulmonary:     Effort: Pulmonary effort is normal.     Breath sounds: Normal breath sounds.  Abdominal:     General: Abdomen is flat.     Palpations: Abdomen is soft.  Musculoskeletal:     Right lower leg: No edema.     Left lower leg: No edema.  Skin:    General: Skin is warm and dry.  Neurological:     Mental Status: He is alert and oriented to person, place, and time.  Psychiatric:        Mood and Affect: Mood normal.         Behavior: Behavior normal.   Right radial site covered, C/D/I   Labs & Radiologic Studies    CBC Recent Labs    12/17/21 0859 12/19/21 1640 12/19/21 1641 12/20/21 0409  WBC 6.3  --   --  7.0  HGB 15.4   < > 14.3 14.2  HCT 45.5   < > 42.0 41.4  MCV 91  --   --  90.8  PLT 243  --   --  229   < > = values in this interval not displayed.   Basic Metabolic Panel Recent Labs    12/17/21 0859 12/19/21 1640 12/19/21 1641 12/20/21 0409  NA 143   < > 142 137  K 4.7   < > 3.7 4.3  CL 108*  --   --  107  CO2 22  --   --  24  GLUCOSE 96  --   --  93  BUN 9  --   --  12  CREATININE 1.00  --   --  1.07  CALCIUM 9.9  --   --  9.4   < > = values in this interval not displayed.   Liver Function Tests No results for input(s): "AST", "ALT", "ALKPHOS", "BILITOT", "PROT", "ALBUMIN" in the last 72 hours. No results for input(s): "LIPASE", "AMYLASE" in the last 72 hours. High Sensitivity Troponin:   No results for input(s): "TROPONINIHS" in the last 720 hours.  BNP Invalid input(s): "POCBNP" D-Dimer No results for input(s): "DDIMER" in the last 72 hours. Hemoglobin A1C No results for input(s): "HGBA1C" in the last 72 hours. Fasting Lipid Panel No results for input(s): "CHOL", "HDL", "LDLCALC", "TRIG", "CHOLHDL", "LDLDIRECT" in the last 72 hours. Thyroid Function Tests No results for input(s): "TSH", "T4TOTAL", "T3FREE", "THYROIDAB" in the last 72 hours.  Invalid input(s): "FREET3" _____________  CARDIAC CATHETERIZATION  Result Date: 12/19/2021 1.  Severe proximal LAD stenosis, treated with PCI using a 3.5 x 24 mm Synergy DES 2.  Mild diffuse plaquing in the RCA, PDA, left circumflex, and intermediate branches with severe stenosis in a small posterolateral branch of the RCA 3.  Essentially normal right heart pressures and left heart filling pressures Recommendations: Aggressive medical therapy, overnight observation with plans for discharge tomorrow as long as no early  complications arise.  Favor dual antiplatelet therapy with aspirin and ticagrelor for 12 months.  Even though this patient is done as an outpatient, his angiographic features suggest plaque rupture and I would treat him for a longer duration if tolerated.    Disposition   Pt is being discharged home today in good condition.  Follow-up Plans & Appointments     Follow-up Information     Loel Dubonnet, NP Follow up on 01/01/2022.   Specialty: Cardiology Why: 10:55 am Contact information: [address] [place] [address] [phone]                Discharge Instructions     AMB Referral to Cardiac Rehabilitation - Phase II   Complete by: As directed    Diagnosis: Coronary Stents   After initial evaluation and assessments completed: Virtual Based Care may be provided alone or in conjunction with Phase 2 Cardiac Rehab based on patient barriers.: Yes       Discharge Medications   Allergies as of 12/20/2021   No Known Allergies      Medication List     STOP taking these medications    hydrochlorothiazide 12.5 MG capsule Commonly known as: MICROZIDE Replaced by: hydrochlorothiazide 12.5 MG tablet       TAKE these medications    aspirin EC 81 MG tablet Take 1 tablet (81 mg total) by mouth daily.   atorvastatin 80 MG tablet Commonly known as: LIPITOR Take 1 tablet (80 mg total) by mouth daily. What changed:  medication strength how much to take   brimonidine 0.15 % ophthalmic solution Commonly known as: ALPHAGAN Place 1 drop into the left eye daily.   dorzolamide-timolol 22.3-6.8 MG/ML ophthalmic solution Commonly known as: COSOPT Place 1 drop into the left eye daily.   hydrochlorothiazide 12.5 MG tablet Commonly known as: HYDRODIURIL Take 1 tablet (12.5 mg total) by mouth daily. Replaces: hydrochlorothiazide 12.5 MG capsule   losartan 100 MG tablet Commonly known as: COZAAR Take 100 mg by mouth daily.   nitroGLYCERIN 0.4 MG SL  tablet Commonly known  as: Nitrostat Place 1 tablet (0.4 mg total) under the tongue every 5 (five) minutes as needed for chest pain.   Rocklatan 0.02-0.005 % Soln Generic drug: Netarsudil-Latanoprost Place 1 drop into the left eye at bedtime.   ticagrelor 90 MG Tabs tablet Commonly known as: BRILINTA Take 1 tablet (90 mg total) by mouth 2 (two) times daily.           Outstanding Labs/Studies   none  Duration of Discharge Encounter   Greater than 30 minutes including physician time.  Signed, Tami Lin Duke, PA 12/20/2021, 8:30 AM   History and all data above reviewed.  Patient examined.  I agree with the findings as above.   Ambulated in the hallway.  No pain. No SOB. The patient exam reveals COR:RRR  ,  Lungs: Clear  ,  Abd: Positive bowel sounds, no rebound no guarding, Ext No edema, right radial without bleeding or bruising  .  All available labs, radiology testing, previous records reviewed. Agree with documented assessment and plan. Unstable angina:  Plan as outlined above.  Smoking cessation:  I talked to him and educated him about the contribution to plaque rupture.  He can try to quit without therapy other than OTC nicotine supplement.    Minus Breeding  9:50 AM  12/20/2021

## 2021-12-20 NOTE — Progress Notes (Signed)
CARDIAC REHAB PHASE I   PRE:  Rate/Rhythm: 78 SR  BP:  Sitting: 181/72      SaO2: 99 RA  MODE:  Ambulation: 240 ft   POST:  Rate/Rhythm: 98 SR  BP:  Sitting: 159/84      SaO2: 99 RA  Pt ambulated in hall independently with no CP or SOB. Back to room sitting on side of bed with bedside table and call bell in reach. Post stent education including risk factors, heart healthy diet, site care, restrictions, exercise guidelines, antiplatelet therapy, and CRP2. Will refer to [facility] for CRP2. All questions and concerns addressed. Plan for home today.   [id number]   Vanessa Barbara, RN BSN 12/20/2021 9:00 AM

## 2021-12-21 LAB — LIPOPROTEIN A (LPA): Lipoprotein (a): 155.3 nmol/L — ABNORMAL HIGH (ref <75.0)

## 2021-12-22 ENCOUNTER — Telehealth: Payer: Self-pay | Admitting: Cardiology

## 2021-12-22 NOTE — Telephone Encounter (Signed)
Called by Pt's wife that the dressing on Rt radial cath site fell off, and the site looked bad.  When asked if swelling she said yes and if larger than a tennis ball she said yes, and that in last hour had increased in size.  Not much pain now seemed to be more when bandage first fell off.  With increase in size have asked them to come to ER.  His wife is agreeable.

## 2021-12-23 NOTE — Telephone Encounter (Signed)
Called patient who states that his site looks fine now. He denies any swelling, pain or redness. Advised to call us back if anything changes. Patient verbalized understanding.

## 2021-12-26 ENCOUNTER — Ambulatory Visit: Payer: Medicare Other | Admitting: Nurse Practitioner

## 2021-12-26 ENCOUNTER — Telehealth (HOSPITAL_COMMUNITY): Payer: Self-pay

## 2021-12-26 NOTE — Telephone Encounter (Signed)
Pt insurance is active and benefits verified through [facility]  Co-pay 0, DED 0/0 met, out of pocket $8,300/$89.42 met, co-insurance 20%. no pre-authorization required. Passport, 12/26/2021_0 :51pm, REF# [id number]   How many CR sessions are covered? (36 sessions for TCR, 72 sessions for ICR)72 Is this a lifetime maximum or an annual maximum? lifetime Has the member used any of these services to date? no Is there a time limit (weeks/months) on start of program and/or program completion? no     Will contact patient to see if he is interested in the Cardiac Rehab Program. If interested, patient will need to complete follow up appt. Once completed, patient will be contacted for scheduling upon review by the RN Navigator.

## 2022-01-01 ENCOUNTER — Encounter (HOSPITAL_BASED_OUTPATIENT_CLINIC_OR_DEPARTMENT_OTHER): Payer: Self-pay | Admitting: Family

## 2022-01-01 ENCOUNTER — Ambulatory Visit (INDEPENDENT_AMBULATORY_CARE_PROVIDER_SITE_OTHER): Payer: Medicare Other | Admitting: Family

## 2022-01-01 VITALS — BP 130/90 | HR 60 | Ht 64.0 in | Wt 198.0 lb

## 2022-01-01 DIAGNOSIS — I25118 Atherosclerotic heart disease of native coronary artery with other forms of angina pectoris: Secondary | ICD-10-CM | POA: Diagnosis not present

## 2022-01-01 DIAGNOSIS — I10 Essential (primary) hypertension: Secondary | ICD-10-CM | POA: Diagnosis not present

## 2022-01-01 DIAGNOSIS — R001 Bradycardia, unspecified: Secondary | ICD-10-CM | POA: Diagnosis not present

## 2022-01-01 DIAGNOSIS — E785 Hyperlipidemia, unspecified: Secondary | ICD-10-CM | POA: Diagnosis not present

## 2022-01-01 NOTE — Patient Instructions (Addendum)
Medication Instructions:  Continue your current medications.   It is very important to take your Ticagrelor (Brilinta) and Aspirin as prescribed. If you are running low or the medication becomes too expensive, please call and let us know. Do not miss any doses as these medicine are to protect your stent.   *If you need a refill on your cardiac medications before your next appointment, please call your pharmacy*   Lab Work: None ordered today.    Testing/Procedures: Your EKG today looked great!   Follow-Up: At [facility], you and your health needs are our priority.  As part of our continuing mission to provide you with exceptional heart care, we have created designated Provider Care Teams.  These Care Teams include your primary Cardiologist (physician) and Advanced Practice Providers (APPs -  Physician Assistants and Nurse Practitioners) who all work together to provide you with the care you need, when you need it.  We recommend signing up for the patient portal called "MyChart".  Sign up information is provided on this After Visit Summary.  MyChart is used to connect with patients for Virtual Visits (Telemedicine).  Patients are able to view lab/test results, encounter notes, upcoming appointments, etc.  Non-urgent messages can be sent to your provider as well.   To learn more about what you can do with MyChart, go to [email or web address].    Your next appointment:   3 month(s)  The format for your next appointment:   In Person  Provider:   Minus Breeding, MD     Other Instructions:  Keep your blood blister covered while you are out and about just to protect it.   Heart Healthy Diet Recommendations: A low-salt diet is recommended. Meats should be grilled, baked, or boiled. Avoid fried foods. Focus on lean protein sources like fish or chicken with vegetables and fruits. The American Heart Association is a Microbiologist!  American Heart Association Diet and Lifeystyle  Recommendations   Exercise recommendations: The American Heart Association recommends 150 minutes of moderate intensity exercise weekly. Try 30 minutes of moderate intensity exercise 4-5 times per week. This could include walking, jogging, or swimming. Recommend participating in cardiac rehab - they will contact you to schedule.   Important Information About Sugar

## 2022-01-01 NOTE — Progress Notes (Signed)
Office Visit    Patient Name: Todd Ferguson Date of Encounter: 01/04/2022  PCP:  Jilda Panda, [place]  Cardiologist:  Minus Breeding, MD  Advanced Practice Provider:  Loel Dubonnet, NP Electrophysiologist:  Virl Axe, MD      Chief Complaint    Todd Ferguson is a 81 y.o. male presents today for follow up after PCI/DES.  Past Medical History    Past Medical History:  Diagnosis Date   Bradycardia    CAD S/P percutaneous coronary angioplasty    12/19/21: 3.5 x 24 mm synergy DES to proximal-mid LAD, residual disease inteh RCA, PDA, LCX, and intermediate branches   Cataract    had one on his right eye, per pt they removed his right eye because of the cateract.   Hyperlipidemia with target LDL less than 70    Hypertension    Sinus pause    3.9-4.5 sec documented, asymptomatic   Tobacco abuse    Past Surgical History:  Procedure Laterality Date   APPENDECTOMY     CORONARY STENT INTERVENTION N/A 12/19/2021   Procedure: CORONARY STENT INTERVENTION;  Surgeon: Sherren Mocha, MD;  Location: [facility];  Service: Cardiovascular;  Laterality: N/A;   DENTAL SURGERY     Removed right eye     RIGHT/LEFT HEART CATH AND CORONARY ANGIOGRAPHY N/A 12/19/2021   Procedure: RIGHT/LEFT HEART CATH AND CORONARY ANGIOGRAPHY;  Surgeon: Sherren Mocha, MD;  Location: [facility];  Service: Cardiovascular;  Laterality: N/A;    Allergies  No Known Allergies  History of Present Illness    Todd Ferguson is a 81 y.o. male with a hx of CAD s/p DES to LAD 12/2021, bradycardia with sinus pause (evaluated by EP and not recommended fr PPM), HTN, tobacco use last seen for cardiac cath 12/19/21.   Echo 01/2019 LVEF 60 to 65%, RV normal size and function, mild aortic stenosis. Atrial fibrillation noted during echo 01/2019 though not captured by monitor same month. Has not been on [place]. ETT 01/2019 no ischemic response, no chronotropic incompetence  with HR increasing. Monitor 03/2019 average HR 63 bpm, runs of SB with occasional sinus pauses, PVCs, brief runs of junctional rhythm. EP evaluated and as asymptomatic no PPM recommended. Recommended avoidance of AV nodal blocking agents.   Last seen 01/2020 - shortness of breath had resolved, repeat lipids ordered, declined sleep study.   At follow-up 12/17/2021 EKG with new T wave inversion lateral leads.  He also noted chest discomfort with ambulation and stable exertional dyspnea.  He was set up for [facility] performed 12/18/2021 with 99% occlusion proximal LAD treated with DES with mild diffuse plaque in RCA, PDA, LCx, intermediate branches with severe stenosis of small posterolateral branch of the RCA.  Normal right heart pressures and left heart filling pressures.  Recommended for aspirin/Brilinta for 12 months.  Angiographic features suggest plaque rupture and recommended for longer duration of tolerated.  He presents today for follow up independently.  Since discharge no chest pain, pressure, tightness.  Reports exertional dyspnea overall stable.  Right radial catheterization site healing appropriately.  However does have blood blister on the dorsal aspect of his forearm which she has been leaving covered and is shrinking in size. Mostly sedentary watching television during the day.  Does not have BP cuff at home to monitor. EKGs/Labs/Other Studies Reviewed:   The following studies were reviewed today:  Cardiac cath 12/19/21  1.  Severe proximal LAD  stenosis, treated with PCI using a 3.5 x 24 mm Synergy DES 2.  Mild diffuse plaquing in the RCA, PDA, left circumflex, and intermediate branches with severe stenosis in a small posterolateral branch of the RCA 3.  Essentially normal right heart pressures and left heart filling pressures  Recommendations: Aggressive medical therapy, overnight observation with plans for discharge tomorrow as long as no early complications arise.  Favor dual antiplatelet  therapy with aspirin and ticagrelor for 12 months.  Even though this patient is done as an outpatient, his angiographic features suggest plaque rupture and I would treat him for a longer duration if tolerated.   Coronary Diagrams  Diagnostic Dominance: Right  Intervention   Monitor 03/2019 Patient had a min HR of 28 bpm, max HR of 146 bpm, and avg HR of 63 bpm. Predominant underlying rhythm was Sinus Rhythm.    Bradycardia noted frequently.   Runs of sinus bradycardia with occasional sinus pauses. Infrequent ventricular ectopy with paired PVCs Brief runs of junctional rhythm.   ETT 01/2019 There was no ST segment deviation noted during stress. No T wave inversion was noted during stress.   No ischemic response Frequent PVC;s but no NSVT No chronotropic incompetence with HR increasing From 78 to 130 bpm which was 89% PMHR   Echo 01/2019  1. The left ventricle has normal systolic function with an ejection  fraction of 60-65%. The cavity size was normal. Left ventricular diastolic  function could not be evaluated secondary to atrial fibrillation.   2. The right ventricle has normal systolic function. The cavity was  normal. There is no increase in right ventricular wall thickness. Right  ventricular systolic pressure could not be assessed.   3. Mild calcification of the anterior mitral valve leaflet.   4. The aortic valve is tricuspid. Moderate calcification of the aortic  valve. Aortic valve regurgitation was not assessed by color flow Doppler.  Mild stenosis of the aortic valve.   5. The aorta is normal unless otherwise noted.   6. The inferior vena cava was dilated in size with >50% respiratory  variability.   EKG:  EKG is ordered today.  The ekg ordered today demonstrates NSR 60 bpm with sinus arrhythmia. Noacute ST/T wave changes.    Recent Labs: 12/20/2021: BUN 12; Creatinine, Ser 1.07; Hemoglobin 14.2; Platelets 229; Potassium 4.3; Sodium 137  Recent Lipid Panel     Component Value Date/Time   CHOL 127 12/20/2021 0409   CHOL 189 01/27/2020 1623   TRIG 101 12/20/2021 0409   HDL 40 (L) 12/20/2021 0409   HDL 50 01/27/2020 1623   CHOLHDL 3.2 12/20/2021 0409   VLDL 20 12/20/2021 0409   LDLCALC 67 12/20/2021 0409   LDLCALC 123 (H) 01/27/2020 1623    Home Medications   Current Meds  Medication Sig   aspirin EC 81 MG tablet Take 1 tablet (81 mg total) by mouth daily.   atorvastatin (LIPITOR) 80 MG tablet Take 1 tablet (80 mg total) by mouth daily.   brimonidine (ALPHAGAN) 0.15 % ophthalmic solution Place 1 drop into the left eye daily.   dorzolamide-timolol (COSOPT) 22.3-6.8 MG/ML ophthalmic solution Place 1 drop into the left eye daily.   hydrochlorothiazide (HYDRODIURIL) 12.5 MG tablet Take 1 tablet (12.5 mg total) by mouth daily.   losartan (COZAAR) 100 MG tablet Take 100 mg by mouth daily.   nitroGLYCERIN (NITROSTAT) 0.4 MG SL tablet Place 1 tablet (0.4 mg total) under the tongue every 5 (five) minutes as needed for chest  pain.   ROCKLATAN 0.02-0.005 % SOLN Place 1 drop into the left eye at bedtime.   ticagrelor (BRILINTA) 90 MG TABS tablet Take 1 tablet (90 mg total) by mouth 2 (two) times daily.     Review of Systems      All other systems reviewed and are otherwise negative except as noted above.  Physical Exam    VS:  BP (!) 130/90   Pulse 60   Ht '5\' 4"'$  (1.626 m)   Wt 198 lb (89.8 kg)   BMI 33.99 kg/m  , BMI Body mass index is 33.99 kg/m.  Wt Readings from Last 3 Encounters:  01/01/22 198 lb (89.8 kg)  12/19/21 197 lb 11.2 oz (89.7 kg)  12/17/21 201 lb 6.4 oz (91.4 kg)     GEN: Well nourished, well developed, in no acute distress. HEENT: normal. Neck: Supple, no JVD, carotid bruits, or masses. Cardiac: RRR, no murmurs, rubs, or gallops. No clubbing, cyanosis, edema.  Radials/PT 2+ and equal bilaterally.  Respiratory:  Respirations regular and unlabored, clear to auscultation bilaterally. GI: Soft, nontender,  nondistended. MS: No deformity or atrophy. Skin: Warm and dry, no rash. 0.5" in diameter blood blister on the dorsal aspect of forearm likely from TR band placement.  Encouraged to keep covered.  Dry gauze and Kerlix applied and Kerlix applied in clinic.  Neuro:  Strength and sensation are intact. Psych: Normal affect.  Assessment & Plan    CAD s/p DES to LAD 12/19/21 -right radial cath site healing appropriately.  Does have 0.5 inch in diameter blood blister on left dorsal aspect of his forearm likely from TR band.  He has been shrinking in size and encouraged to keep it covered.  GDMT includes DAPT aspirin/Brilinta, atorvastatin.  No beta-blocker due to baseline bradycardia.  Recommended for long-term DAPT given angiographic features suggestive of plaque rupture with DES to LAD.  Encouraged to participate in cardiac rehab.  Hypertension -BP mildly elevated in clinic today 130/90.  Recommend purchase BP cuff for home monitoring..  We will continue current medications Including hydrochlorothiazide 12.5 mg daily, losartan 100 mg daily.  If BP remains uncontrolled despite medications transition losartan to valsartan  HLD - 12/20/21 LDL 67.  Continue atorvastatin 80 mg daily.  Bradycardia -prior monitor with bradycardia and pauses but no symptomatic.  Evaluated by EP who did not recommend PPM.  He has previously declined sleep study.  Avoid AV nodal blocking agents.    Cardiac Rehabilitation Eligibility Assessment  The patient is ready to start cardiac rehabilitation from a cardiac standpoint.        Disposition: Follow up in 3 months with Minus Breeding, MD or APP.  Signed, Loel Dubonnet, NP 01/04/2022, 2:20 PM [place]

## 2022-01-04 ENCOUNTER — Encounter (HOSPITAL_BASED_OUTPATIENT_CLINIC_OR_DEPARTMENT_OTHER): Payer: Self-pay | Admitting: Family

## 2022-03-27 ENCOUNTER — Telehealth: Payer: Self-pay | Admitting: Cardiology

## 2022-03-27 ENCOUNTER — Other Ambulatory Visit: Payer: Self-pay

## 2022-03-27 DIAGNOSIS — Z79899 Other long term (current) drug therapy: Secondary | ICD-10-CM

## 2022-03-27 MED ORDER — FUROSEMIDE 20 MG PO TABS: 20.0000 mg | ORAL_TABLET | Freq: Every day | ORAL | 1 refills | Status: DC

## 2022-03-27 NOTE — Telephone Encounter (Addendum)
Advised spouse of lasix '20mg'$  daily, reduce salt intake and get lab work (BMET) on 11/17. Wife verbalized understanding. Orders placed.

## 2022-03-27 NOTE — Telephone Encounter (Signed)
Spouse stated patient has swelling in hands and feet. He is taking 12.5 hydrochlorothiazide daily. He eats bologna and hot dogs. Discussed with spouse low sodium diet foods. She will buy a weight scale for patient to use each morning. Daily wt explained. Has exertional SOB and does not want to get out of chair during the day. Please advise on edema and SOB.

## 2022-03-27 NOTE — Telephone Encounter (Signed)
Pt c/o swelling: STAT is pt has developed SOB within 24 hours  If swelling, where is the swelling located?  Hands and feet How much weight have you gained and in what time span? NA  Have you gained 3 pounds in a day or 5 pounds in a week? No  Do you have a log of your daily weights (if so, list)? NO  Are you currently taking a fluid pill? No  Are you currently SOB? No  Have you traveled recently? No  Spouse would like to know if pt is able to be prescribed a fluid pill due to swelling. Please advise

## 2022-04-02 NOTE — Progress Notes (Unsigned)
  Cardiology Office Note   Date:  04/03/2022   ID:  Todd Ferguson, DOB 11/12/1940, MRN [id number]  PCP:  Moreira, Roy, MD  Cardiologist:   James Hochrein, MD   Chief Complaint  Patient presents with   Shortness of Breath      History of Present Illness: Todd Ferguson is a 81 y.o. 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  He presents for follow-up..  Of note when he was admitted he had arm pain.  He says he has had no further arm pain and none of the chest discomfort he was having.  However, he is getting increasingly short of breath.  He says that this happens with moderate activity such as climbing some stairs.  He is not describing PND or orthopnea.  He has not been having any weight gain or edema.  He is not noticing any palpitations or presyncope or syncope.  He does think this is slowly progressive and as previously mentioned.  He has not wanted sleep study in the past.  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   Hyperlipidemia with target LDL less than 70    Hypertension    Sinus pause    3.9-4.5 sec documented, asymptomatic   Tobacco abuse     Past Surgical History:  Procedure Laterality Date   APPENDECTOMY     CORONARY STENT INTERVENTION N/A 12/19/2021   Procedure: CORONARY STENT INTERVENTION;  Surgeon: Cooper, Michael, MD;  Location: [facility];  Service: Cardiovascular;  Laterality: N/A;   DENTAL SURGERY     Removed right eye     RIGHT/LEFT HEART CATH AND CORONARY ANGIOGRAPHY N/A 12/19/2021   Procedure: RIGHT/LEFT HEART CATH AND CORONARY ANGIOGRAPHY;  Surgeon: Cooper, Michael, MD;  Location: [facility];  Service: Cardiovascular;  Laterality: N/A;      Current Outpatient Medications  Medication Sig Dispense Refill   aspirin EC 81 MG tablet Take 1 tablet (81 mg total) by mouth daily. 30 tablet 6   atorvastatin (LIPITOR) 80 MG tablet Take 1 tablet (80 mg total) by mouth daily. 90 tablet 3   clopidogrel (PLAVIX) 75 MG tablet Take 1 tablet (75 mg total) by mouth daily. 90 tablet 3   dorzolamide-timolol (COSOPT) 22.3-6.8 MG/ML ophthalmic solution Place 1 drop into the left eye daily.     furosemide (LASIX) 20 MG tablet Take 1 tablet (20 mg total) by mouth daily. 90 tablet 1   nitroGLYCERIN (NITROSTAT) 0.4 MG SL tablet Place 1 tablet (0.4 mg total) under the tongue every 5 (five) minutes as needed for chest pain. 25 tablet 3   ROCKLATAN 0.02-0.005 % SOLN Place 1 drop into the left eye at bedtime.     brimonidine (ALPHAGAN) 0.15 % ophthalmic solution Place 1 drop into the left eye daily.     hydrochlorothiazide (HYDRODIURIL) 25 MG tablet Take 1 tablet (25 mg total) by mouth daily. 90 tablet 3   No current facility-administered medications for this visit.    Allergies:   Patient has no known allergies.    ROS:  Please see the history of present illness.   Otherwise, review of   systems are positive for none.   All other systems are reviewed and negative.    PHYSICAL EXAM: VS:  BP (!) 160/88   Pulse 68   Ht 5' 4" (1.626 m)   Wt 205 lb 6.4 oz (93.2 kg)   SpO2 97%   BMI 35.26 kg/m  , BMI Body mass index is 35.26 kg/m. GENERAL:  Well appearing NECK:  No jugular venous distention, waveform within normal limits, carotid upstroke brisk and symmetric, no bruits, no thyromegaly LUNGS:  Clear to auscultation bilaterally CHEST:  Unremarkable HEART:  PMI not displaced or sustained,S1 and S2 within normal limits, no S3, no S4, no clicks, no rubs, soft apical systolic murmur radiating slightly at the aortic outflow tract murmurs ABD:  Flat, positive bowel sounds normal in frequency in pitch, no bruits, no rebound, no guarding, no midline  pulsatile mass, no hepatomegaly, no splenomegaly EXT:  2 plus pulses upper pulses and dorsalis pedis posterior tibialis decreased bilaterally, no edema, no cyanosis no clubbing   August 2023 Cath  Diagnostic Dominance: Right  Intervention       EKG:  EKG is ordered today. The ekg ordered today demonstrates sinus bradycardia, rate 52, axis within normal limits, intervals within normal limits, no acute ST-T wave changes.   Recent Labs: 12/20/2021: BUN 12; Creatinine, Ser 1.07; Hemoglobin 14.2; Platelets 229; Potassium 4.3; Sodium 137    Lipid Panel    Component Value Date/Time   CHOL 127 12/20/2021 0409   CHOL 189 01/27/2020 1623   TRIG 101 12/20/2021 0409   HDL 40 (L) 12/20/2021 0409   HDL 50 01/27/2020 1623   CHOLHDL 3.2 12/20/2021 0409   VLDL 20 12/20/2021 0409   LDLCALC 67 12/20/2021 0409   LDLCALC 123 (H) 01/27/2020 1623      Wt Readings from Last 3 Encounters:  04/03/22 205 lb 6.4 oz (93.2 kg)  01/01/22 198 lb (89.8 kg)  12/19/21 197 lb 11.2 oz (89.7 kg)      Other studies Reviewed: Additional studies/ records that were reviewed today include: Hospital records. Review of the above records demonstrates:  Please see elsewhere in the note.     ASSESSMENT AND PLAN:  CAD: The symptoms of shortness of breath or not reminiscent of his previous angina.  At this point I am not planning to begin with ischemia work-up but we will keep this in mind.  He needs secondary risk reduction.  I would like to switch him to Plavix as below.  Bradycardia:  We are avoiding AV nodal blocking agents.   HTN: I am going to add a low-dose of HCTZ 25 mg as his blood pressure is not at target.  He can get a be met when he returns.  Dyslipidemia: LDL was 67 with an HDL of 40 in August.  No change in therapy.  SOB: I am going to switch him from Brilinta to Plavix.  I am going to order pulmonary function testing.  Current medicines are reviewed at length with the patient today.  The  patient does not have concerns regarding medicines.  The following changes have been made: As above  Labs/ tests ordered today include:   Orders Placed This Encounter  Procedures   EKG 12-Lead   Pulmonary function test     Disposition:   FU with APP in 1 month   Signed, James Hochrein, MD  04/03/2022 11:17 AM     [facility]    

## 2022-04-03 ENCOUNTER — Encounter: Payer: Self-pay | Admitting: Cardiology

## 2022-04-03 ENCOUNTER — Other Ambulatory Visit: Payer: Self-pay

## 2022-04-03 ENCOUNTER — Ambulatory Visit: Payer: Medicare Other | Attending: Cardiology | Admitting: Cardiology

## 2022-04-03 VITALS — BP 160/88 | HR 68 | Ht 64.0 in | Wt 205.4 lb

## 2022-04-03 DIAGNOSIS — Z9861 Coronary angioplasty status: Secondary | ICD-10-CM | POA: Diagnosis not present

## 2022-04-03 DIAGNOSIS — R001 Bradycardia, unspecified: Secondary | ICD-10-CM

## 2022-04-03 DIAGNOSIS — E785 Hyperlipidemia, unspecified: Secondary | ICD-10-CM | POA: Diagnosis not present

## 2022-04-03 DIAGNOSIS — I2510 Atherosclerotic heart disease of native coronary artery without angina pectoris: Secondary | ICD-10-CM

## 2022-04-03 DIAGNOSIS — R0602 Shortness of breath: Secondary | ICD-10-CM | POA: Diagnosis not present

## 2022-04-03 MED ORDER — HYDROCHLOROTHIAZIDE 25 MG PO TABS
25.0000 mg | ORAL_TABLET | Freq: Every day | ORAL | 3 refills | Status: DC
  Filled 2022-04-03 – 2022-04-14 (×2): qty 90, 90d supply, fill #0
  Filled 2022-07-26: qty 90, 90d supply, fill #1

## 2022-04-03 MED ORDER — CLOPIDOGREL BISULFATE 75 MG PO TABS
75.0000 mg | ORAL_TABLET | Freq: Every day | ORAL | 3 refills | Status: DC
  Filled 2022-04-03 – 2022-04-14 (×2): qty 90, 90d supply, fill #0
  Filled 2022-07-25: qty 90, 90d supply, fill #1

## 2022-04-03 NOTE — Patient Instructions (Addendum)
Medication Instructions:  STOP Brilinta START Plavix 75 mg daily  INCREASE (hydrochlorothiazide) HCTZ to 25 mg daily  *If you need a refill on your cardiac medications before your next appointment, please call your pharmacy*  Testing/Procedures: Your physician has recommended that you have a pulmonary function test. Pulmonary Function Tests are a group of tests that measure how well air moves in and out of your lungs.  Follow-Up: At [facility], you and your health needs are our priority.  As part of our continuing mission to provide you with exceptional heart care, we have created designated Provider Care Teams.  These Care Teams include your primary Cardiologist (physician) and Advanced Practice Providers (APPs -  Physician Assistants and Nurse Practitioners) who all work together to provide you with the care you need, when you need it.  We recommend signing up for the patient portal called "MyChart".  Sign up information is provided on this After Visit Summary.  MyChart is used to connect with patients for Virtual Visits (Telemedicine).  Patients are able to view lab/test results, encounter notes, upcoming appointments, etc.  Non-urgent messages can be sent to your provider as well.   To learn more about what you can do with MyChart, go to [email or web address].    Your next appointment:   1 month(s) with NP or PA

## 2022-04-07 ENCOUNTER — Ambulatory Visit (HOSPITAL_COMMUNITY)
Admission: RE | Admit: 2022-04-07 | Discharge: 2022-04-07 | Disposition: A | Discharge status: Home / Self Care | Payer: Medicare Other | Source: Ambulatory Visit | Attending: Cardiology | Admitting: Cardiology

## 2022-04-07 DIAGNOSIS — R0602 Shortness of breath: Secondary | ICD-10-CM | POA: Insufficient documentation

## 2022-04-07 LAB — PULMONARY FUNCTION TEST
DL/VA % pred: 87 %
DL/VA: 3.52 ml/min/mmHg/L
DLCO unc % pred: 66 %
DLCO unc: 13.38 ml/min/mmHg
FEF 25-75 Post: 2.06 L/sec
FEF 25-75 Pre: 2.09 L/sec
FEF2575-%Change-Post: -1 %
FEF2575-%Pred-Post: 138 %
FEF2575-%Pred-Pre: 140 %
FEV1-%Change-Post: 2 %
FEV1-%Pred-Post: 94 %
FEV1-%Pred-Pre: 92 %
FEV1-Post: 2.06 L
FEV1-Pre: 2.01 L
FEV1FVC-%Change-Post: -2 %
FEV1FVC-%Pred-Pre: 111 %
FEV6-%Change-Post: 6 %
FEV6-%Pred-Post: 91 %
FEV6-%Pred-Pre: 85 %
FEV6-Post: 2.6 L
FEV6-Pre: 2.45 L
FEV6FVC-%Change-Post: 0 %
FEV6FVC-%Pred-Post: 107 %
FEV6FVC-%Pred-Pre: 106 %
FVC-%Change-Post: 4 %
FVC-%Pred-Post: 84 %
FVC-%Pred-Pre: 80 %
FVC-Post: 2.63 L
FVC-Pre: 2.51 L
Post FEV1/FVC ratio: 78 %
Post FEV6/FVC ratio: 99 %
Pre FEV1/FVC ratio: 80 %
Pre FEV6/FVC Ratio: 98 %
RV % pred: 79 %
RV: 1.83 L
TLC % pred: 76 %
TLC: 4.44 L

## 2022-04-07 MED ORDER — ALBUTEROL SULFATE (2.5 MG/3ML) 0.083% IN NEBU
2.5000 mg | INHALATION_SOLUTION | Freq: Once | RESPIRATORY_TRACT | Status: AC
  Administered 2022-04-07: 2.5 mg via RESPIRATORY_TRACT

## 2022-04-09 ENCOUNTER — Other Ambulatory Visit: Payer: Self-pay

## 2022-04-14 ENCOUNTER — Other Ambulatory Visit: Payer: Self-pay

## 2022-04-15 ENCOUNTER — Telehealth: Payer: Self-pay | Admitting: Cardiology

## 2022-04-15 NOTE — Telephone Encounter (Signed)
Pt's wife would like for nurse to callback regarding results. Please advise

## 2022-04-15 NOTE — Telephone Encounter (Signed)
Left message for pts wife to call back. 

## 2022-04-16 NOTE — Telephone Encounter (Signed)
Left message for pt to call.

## 2022-04-22 NOTE — Progress Notes (Signed)
Cardiology Office Note:    Date:  04/28/2022   ID:  Todd Ferguson, DOB 07/16/1940, MRN [id number]  PCP:  Jilda Panda, [place] [facility] Cardiologist:  Minus Breeding, MD Cardiology APP:  Loel Dubonnet, NP  Electrophysiologist:  Virl Axe, MD     Referring MD: Jilda Panda, MD   Chief Complaint  Patient presents with   Follow-up    CAD, HTN    History of Present Illness:    Todd Ferguson is a 81 y.o. male with a hx of CAD s/p DES-LAD 12/2021, bradycardia with sinus pause (EP did not recommend PPM), HTN, and tobacco abuse.   Echo 01/2019 with LVEF 60-65%, RV normal, and mild aortic stenosis. Afib was noted during this echo but not captured on heart monitor later in the same year. Was not anticoagulated. POET 01/2019 was not ischemic, no chronotropic incompetence. Heart monitor 03/2019 showed min HR 28 with frequent bradycardia and occasional sinus pauses. He was referred to EP but PPM not recommended given absence of symptoms. Avoid AV nodal blocking agents. He has declined sleep study in the past.   He was seen in 12/2021 with symptoms concerning for angina and was scheduled for heart catheterization. LHC showed severe proximal LAD stenosis treated with 3.5 x 24 mm DES with mild residual diffuse plaquing in RCA, PDA, LCX, and intermediate branches with severe stenosis in the small RCA/PLA. Dr. Burt Knack favored DAPT with brilinta since angiographically he appears to have plaque rupture. Smokign cessation was strongly recommended.   Telemetry did show sinus bradycardia with junctional escape beats and sinus pauses up to 3.91 sec. Heart monitor with longest pause 4.5 sec. I confirmed that he was awake and asymptomatic during this pause.  No beta-blocker.  He was last seen by Dr. Percival Spanish 04/03/2022 with shortness of breath.  Dr. Percival Spanish did not think his symptoms were reminiscent of his prior angina.  Secondary risk reduction was stressed.  He was switched  from Brilinta to Plavix.  PFTs obtained and were mildly abnormal.  Monitor response on Plavix and refer to pulmonology if ongoing shortness of breath.  He presents back today for evaluation of his shortness of breath. He states that none of his family members were able to find [facility] to get his medications for him. He has been out of all medications, including plavix, for "a couple of days."  He is hypertensive today. He is unsure what medications he is supposed to take.  He states his breathing is "ok" but describes fatigue with walking. No chest pain.  He can't find his BP cuff.    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   Hyperlipidemia with target LDL less than 70    Hypertension    Sinus pause    3.9-4.5 sec documented, asymptomatic   Tobacco abuse     Past Surgical History:  Procedure Laterality Date   APPENDECTOMY     CORONARY STENT INTERVENTION N/A 12/19/2021   Procedure: CORONARY STENT INTERVENTION;  Surgeon: Sherren Mocha, MD;  Location: [facility];  Service: Cardiovascular;  Laterality: N/A;   DENTAL SURGERY     Removed  right eye     RIGHT/LEFT HEART CATH AND CORONARY ANGIOGRAPHY N/A 12/19/2021   Procedure: RIGHT/LEFT HEART CATH AND CORONARY ANGIOGRAPHY;  Surgeon: Sherren Mocha, MD;  Location: [facility];  Service: Cardiovascular;  Laterality: N/A;    Current Medications: Current Meds  Medication Sig   aspirin EC 81 MG tablet Take 1 tablet (81 mg total) by mouth daily.   atorvastatin (LIPITOR) 80 MG tablet Take 1 tablet (80 mg total) by mouth daily.   Blood Pressure Monitoring (BLOOD PRESSURE CUFF) MISC Take Blood Pressure Daily   brimonidine (ALPHAGAN) 0.15 %  ophthalmic solution Place 1 drop into the left eye daily.   clopidogrel (PLAVIX) 75 MG tablet Take 1 tablet (75 mg total) by mouth daily.   dorzolamide-timolol (COSOPT) 22.3-6.8 MG/ML ophthalmic solution Place 1 drop into the left eye daily.   furosemide (LASIX) 20 MG tablet Take 1 tablet (20 mg total) by mouth daily.   hydrochlorothiazide (HYDRODIURIL) 25 MG tablet Take 1 tablet (25 mg total) by mouth daily.   nitroGLYCERIN (NITROSTAT) 0.4 MG SL tablet Place 1 tablet (0.4 mg total) under the tongue every 5 (five) minutes as needed for chest pain.   ROCKLATAN 0.02-0.005 % SOLN Place 1 drop into the left eye at bedtime.     Allergies:   Patient has no known allergies.   Social History   Socioeconomic History   Marital status: Married    Spouse name: Not on file   Number of children: Not on file   Years of education: Not on file   Highest education level: Not on file  Occupational History   Not on file  Tobacco Use   Smoking status: Former    Packs/day: 0.25    Types: Cigarettes    Quit date: 12/24/2021    Years since quitting: 0.3   Smokeless tobacco: Never   Tobacco comments:    occassionally   Substance and Sexual Activity   Alcohol use: Yes    Comment: socially   Drug use: Yes    Types: Marijuana    Comment: Marijuana about 1 week ago.   Sexual activity: Not on file  Other Topics Concern   Not on file  Social History Narrative   Not on file   Social Determinants of Health   Financial Resource Strain: High Risk (09/01/2019)   Overall Financial Resource Strain (CARDIA)    Difficulty of Paying Living Expenses: Very hard  Food Insecurity: Food Insecurity Present (09/01/2019)   Hunger Vital Sign    Worried About Charity fundraiser in the Last Year: Often true    Ran Out of Food in the Last Year: Often true  Transportation Needs: Unmet Transportation Needs (09/01/2019)   PRAPARE - Hydrologist (Medical): Yes    Lack of Transportation  (Non-Medical): Yes  Physical Activity: Not on file  Stress: Not on file  Social Connections: Not on file     Family History: The patient's family history includes Diabetes in his brother; Heart disease in his mother; Kidney disease in his mother. There is no history of Colon cancer, Esophageal cancer, Rectal cancer, Stomach cancer, Prostate cancer, or Pancreatic cancer.  ROS:   Please see the history of present illness.     All other systems reviewed and are negative.  EKGs/Labs/Other Studies Reviewed:    The following studies were reviewed today:  Cardiac cath 12/19/21   1.  Severe proximal LAD stenosis, treated with PCI using a 3.5 x  24 mm Synergy DES 2.  Mild diffuse plaquing in the RCA, PDA, left circumflex, and intermediate branches with severe stenosis in a small posterolateral branch of the RCA 3.  Essentially normal right heart pressures and left heart filling pressures  Recommendations: Aggressive medical therapy, overnight observation with plans for discharge tomorrow as long as no early complications arise.  Favor dual antiplatelet therapy with aspirin and ticagrelor for 12 months.  Even though this patient is done as an outpatient, his angiographic features suggest plaque rupture and I would treat him for a longer duration if tolerated.     Coronary Diagrams   Diagnostic Dominance: Right  Intervention    Monitor 03/2019 Patient had a min HR of 28 bpm, max HR of 146 bpm, and avg HR of 63 bpm. Predominant underlying rhythm was Sinus Rhythm.    Bradycardia noted frequently.   Runs of sinus bradycardia with occasional sinus pauses. Infrequent ventricular ectopy with paired PVCs Brief runs of junctional rhythm.   ETT 01/2019 There was no ST segment deviation noted during stress. No T wave inversion was noted during stress.   No ischemic response Frequent PVC;s but no NSVT No chronotropic incompetence with HR increasing From 78 to 130 bpm which was 89% PMHR    Echo 01/2019  1. The left ventricle has normal systolic function with an ejection  fraction of 60-65%. The cavity size was normal. Left ventricular diastolic  function could not be evaluated secondary to atrial fibrillation.   2. The right ventricle has normal systolic function. The cavity was  normal. There is no increase in right ventricular wall thickness. Right  ventricular systolic pressure could not be assessed.   3. Mild calcification of the anterior mitral valve leaflet.   4. The aortic valve is tricuspid. Moderate calcification of the aortic  valve. Aortic valve regurgitation was not assessed by color flow Doppler.  Mild stenosis of the aortic valve.   5. The aorta is normal unless otherwise noted.   6. The inferior vena cava was dilated in size with >50% respiratory  variability.   EKG:  EKG is not ordered today.    Recent Labs: 12/20/2021: BUN 12; Creatinine, Ser 1.07; Hemoglobin 14.2; Platelets 229; Potassium 4.3; Sodium 137  Recent Lipid Panel    Component Value Date/Time   CHOL 127 12/20/2021 0409   CHOL 189 01/27/2020 1623   TRIG 101 12/20/2021 0409   HDL 40 (L) 12/20/2021 0409   HDL 50 01/27/2020 1623   CHOLHDL 3.2 12/20/2021 0409   VLDL 20 12/20/2021 0409   LDLCALC 67 12/20/2021 0409   LDLCALC 123 (H) 01/27/2020 1623     Risk Assessment/Calculations:          Physical Exam:    VS:  BP (!) 156/57   Pulse 70   Ht '5\' 4"'$  (1.626 m)   Wt 208 lb 9.6 oz (94.6 kg)   SpO2 98%   BMI 35.81 kg/m     Wt Readings from Last 3 Encounters:  04/28/22 208 lb 9.6 oz (94.6 kg)  04/03/22 205 lb 6.4 oz (93.2 kg)  01/01/22 198 lb (89.8 kg)     GEN:  Well nourished, well developed in no acute distress HEENT: Normal NECK: No JVD; No carotid bruits LYMPHATICS: No lymphadenopathy CARDIAC: RRR, no murmurs, rubs, gallops RESPIRATORY:  wheezing  ABDOMEN: Soft, non-tender, non-distended MUSCULOSKELETAL:  No edema; No deformity  SKIN: Warm and dry NEUROLOGIC:  Alert and  oriented x 3 PSYCHIATRIC:  Normal affect  ASSESSMENT:    1. Dyspnea, unspecified type   2. CAD S/P percutaneous coronary angioplasty   3. Dyslipidemia   4. Primary hypertension   5. History of medication noncompliance    PLAN:    In order of problems listed above:  Shortness of breath Brilinta switched to Plavix PFTs were mildly abnormal.  Planned to evaluate response to Plavix.  If his shortness of breath is not improved, will send to pulmonology.    CAD s/p DES-LAD 12/2021 Longterm DAPT recommended for plaque rupture ASA and plavix No BB given bradycardia and sinus pauses   Hyperlipidemia with LDL goal < 70 12/20/2021: Cholesterol 127; HDL 40; LDL Cholesterol 67; Triglycerides 101; VLDL 20 Continue high intensity statin   Hypertension 12.5 mg HCTZ, 100 mg losartan If BP elevated, can transition to valsartan HCTZ was increased to 25 mg Not on lasix, as on med list Will hold off on repeat labs since he hasn't had medications    Bradycardia with sinus pauses Asymptomatic No BB  Has seen EP, no plans for PPM at this time   Disposition Medication nonadherence He usually gets medications form [facility] on [address]. I am not clear on why medications were [facility] to [facility]. Confirmed that [facility] will mail his prescriptions to him - should have them in 1-2 days. He will call me if he doesn't have medications in 2 days. I have supplied one bottle of brilinta 90 mg BID (8 tablets) until he receives plavix. I have given very clear instructions on taking brilitna only until he receives plavix, then D/C brlinta. I can't adequately evaluate him until medications are in place. I have asked for a return appt in 2 weeks. I have provided a prescription for BP cuff.       Medication Adjustments/Labs and Tests Ordered: Current medicines are reviewed at length with the patient today.  Concerns regarding medicines are outlined above.  No  orders of the defined types were placed in this encounter.  Meds ordered this encounter  Medications   Blood Pressure Monitoring (BLOOD PRESSURE CUFF) MISC    Sig: Take Blood Pressure Daily    Dispense:  1 each    Refill:  0    Patient Instructions  Medication Instructions:  Take Brillinta 90 ( Take 1 Tablet Twice Daily). When receive Plavix Stop Brillinta. *If you need a refill on your cardiac medications before your next appointment, please call your pharmacy*   Lab Work: No Labs If you have labs (blood work) drawn today and your tests are completely normal, you will receive your results only by: [place] (if you have MyChart) OR A paper copy in the mail If you have any lab test that is abnormal or we need to change your treatment, we will call you to review the results.   Testing/Procedures: No Testing   Follow-Up: At [facility], you and your health needs are our priority.  As part of our continuing mission to provide you with exceptional heart care, we have created designated Provider Care Teams.  These Care Teams include your primary Cardiologist (physician) and Advanced Practice Providers (APPs -  Physician Assistants and Nurse Practitioners) who all work together to provide you with the care you need, when you need it.  We recommend signing up for the patient portal called "MyChart".  Sign up information is provided on this After Visit Summary.  MyChart is used to connect with patients for Virtual Visits (Telemedicine).  Patients  are able to view lab/test results, encounter notes, upcoming appointments, etc.  Non-urgent messages can be sent to your provider as well.   To learn more about what you can do with MyChart, go to [email or web address].    Your next appointment:   4 week(s)  The format for your next appointment:   In Person  Provider:   Fabian Sharp, PA-C      Signed, [place], [place]  04/28/2022 4:25 PM    [place]

## 2022-04-28 ENCOUNTER — Other Ambulatory Visit: Payer: Self-pay

## 2022-04-28 ENCOUNTER — Ambulatory Visit: Payer: Medicare Other | Attending: Physician Assistant | Admitting: Physician Assistant

## 2022-04-28 ENCOUNTER — Encounter: Payer: Self-pay | Admitting: Physician Assistant

## 2022-04-28 VITALS — BP 156/57 | HR 70 | Ht 64.0 in | Wt 208.6 lb

## 2022-04-28 DIAGNOSIS — Z91148 Patient's other noncompliance with medication regimen for other reason: Secondary | ICD-10-CM

## 2022-04-28 DIAGNOSIS — Z9861 Coronary angioplasty status: Secondary | ICD-10-CM

## 2022-04-28 DIAGNOSIS — E785 Hyperlipidemia, unspecified: Secondary | ICD-10-CM

## 2022-04-28 DIAGNOSIS — I2510 Atherosclerotic heart disease of native coronary artery without angina pectoris: Secondary | ICD-10-CM

## 2022-04-28 DIAGNOSIS — R0600 Dyspnea, unspecified: Secondary | ICD-10-CM

## 2022-04-28 DIAGNOSIS — I10 Essential (primary) hypertension: Secondary | ICD-10-CM

## 2022-04-28 MED ORDER — BLOOD PRESSURE CUFF MISC: 0 refills | Status: AC

## 2022-04-28 NOTE — Patient Instructions (Signed)
Medication Instructions:  Take Brillinta 90 ( Take 1 Tablet Twice Daily). When receive Plavix Stop Brillinta. *If you need a refill on your cardiac medications before your next appointment, please call your pharmacy*   Lab Work: No Labs If you have labs (blood work) drawn today and your tests are completely normal, you will receive your results only by: [place] (if you have MyChart) OR A paper copy in the mail If you have any lab test that is abnormal or we need to change your treatment, we will call you to review the results.   Testing/Procedures: No Testing   Follow-Up: At [facility], you and your health needs are our priority.  As part of our continuing mission to provide you with exceptional heart care, we have created designated Provider Care Teams.  These Care Teams include your primary Cardiologist (physician) and Advanced Practice Providers (APPs -  Physician Assistants and Nurse Practitioners) who all work together to provide you with the care you need, when you need it.  We recommend signing up for the patient portal called "MyChart".  Sign up information is provided on this After Visit Summary.  MyChart is used to connect with patients for Virtual Visits (Telemedicine).  Patients are able to view lab/test results, encounter notes, upcoming appointments, etc.  Non-urgent messages can be sent to your provider as well.   To learn more about what you can do with MyChart, go to [email or web address].    Your next appointment:   4 week(s)  The format for your next appointment:   In Person  Provider:   Fabian Sharp, PA-C

## 2022-04-29 ENCOUNTER — Telehealth: Payer: Self-pay | Admitting: Physician Assistant

## 2022-04-29 NOTE — Telephone Encounter (Signed)
Received call back from patient's wife who states that the medical supply stores were unable to provide BP cuff due to patients insurance. Advised patients wife we are out of BP cuffs at the office, advised patients wife to call her insurance company to see if they can send her one or if they can tell her what to do to obtain one. Patients wife verbalized understanding.   Advised patient to call back to office with any issues, questions, or concerns. Patient verbalized understanding.

## 2022-04-29 NOTE — Telephone Encounter (Signed)
New Message:      Patient's wife  said patient saw  Angie Duke yesterday. She gave him a prescription for a blood pressure monitor. Her question is, where does he get this monitor from?

## 2022-04-29 NOTE — Telephone Encounter (Signed)
Returned call to patient and patients wife who states that they are unsure of where to get a BP cuff that they got a prescription for yesterday. Advised them to call medical supply store and try that as per patients wife the pharmacy was unable to provide the monitor. 2 names and phone numbers for medical supply stores locally given to patients wife to see if they can obtain monitor there with prescription. Advised them to call back if any issues. Patients wife verbalized understanding.

## 2022-04-30 NOTE — Telephone Encounter (Signed)
Patient has been seen.

## 2022-05-22 NOTE — Progress Notes (Signed)
Cardiology Office Note:    Date:  06/05/2022   ID:  Todd Ferguson, DOB June 06, 1940, MRN [id number]  PCP:  Jilda Panda, [place] Prov[facility] Cardiologist:  Minus Breeding, MD Cardiology APP:  Loel Dubonnet, NP  Electrophysiologist:  Virl Axe, MD { Referring MD: Jilda Panda, MD   Chief Complaint  Patient presents with   Follow-up    HTN, CAD    History of Present Illness:    Todd Ferguson is a 82 y.o. male with a hx of CAD s/p DES-LAD 12/2021, bradycardia with sinus pause (EP did not recommend PPM), HTN, and prior tobacco abuse (stopped smoking after PCI).   Echo 01/2019 with LVEF 60-65%, RV normal, and mild aortic stenosis. Afib was noted during this echo but not captured on heart monitor later in the same year. Was not anticoagulated. POET 01/2019 was not ischemic, no chronotropic incompetence. Heart monitor 03/2019 showed min HR 28 with frequent bradycardia and occasional sinus pauses. He was referred to EP but PPM not recommended given absence of symptoms. Avoid AV nodal blocking agents. He has declined sleep study in the past.   He was seen in 12/2021 with symptoms concerning for angina and was scheduled for heart catheterization. LHC showed severe proximal LAD stenosis treated with 3.5 x 24 mm DES with mild residual diffuse plaquing in RCA, PDA, LCX, and intermediate branches with severe stenosis in the small RCA/PLA. Dr. Burt Knack favored DAPT with brilinta since angiographically he appears to have plaque rupture. Smoking cessation was strongly recommended.   Telemetry did show sinus bradycardia with junctional escape beats and sinus pauses up to 3.91 sec. Heart monitor with longest pause 4.5 sec. I confirmed that he was awake and asymptomatic during this pause.  No beta-blocker.  He was last seen by Dr. Percival Spanish 04/03/2022 with shortness of breath.  Dr. Percival Spanish did not think his symptoms were reminiscent of his prior angina.  Secondary risk reduction was  stressed.  He was switched from Brilinta to Plavix.  PFTs obtained and were mildly abnormal.  Monitor response on Plavix and refer to pulmonology if ongoing shortness of breath. I saw him in follow up on 04/28/22. He had been out of medications including plavix for a few days. I was able to call [facility] and get them mailed. I gave him 8 tablets of brilinta to get him through to mail order delivery with very strict instructions to discontinue once he had plavix. Could not adequately evaluate him without medications in place, no labs.  He returns today for re-evaluation. He describes fatigue with exertion. He denies SOB and DOE today. He has taken all medications but out of lipitor. Insurance denied BP cuff from DME store, wife was instructed to call insurance company for guidance.   With further questioning, he does admit to episodes of lightheadedness/dizziness, feeling like he might pass out, and feeling like his heart pauses. He denies frank syncope.  He also describes skipped beats.  Given his monitor results in 2020 and symptoms, I think this warrants reevaluation with a heart monitor.  I question rated cardia/pauses is contributory to his fatigue with exertion.  Symptoms are not consistent with prior anginal equivalent.  Its somewhat difficult to get his exact symptoms and timing, somewhat difficult historian with poor insight into his medical issues.   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   Hyperlipidemia with target LDL less than 70    Hypertension    Sinus pause    3.9-4.5 sec documented, asymptomatic   Tobacco abuse     Past Surgical History:  Procedure Laterality Date   APPENDECTOMY      CORONARY STENT INTERVENTION N/A 12/19/2021   Procedure: CORONARY STENT INTERVENTION;  Surgeon: Sherren Mocha, MD;  Location: [facility];  Service: Cardiovascular;  Laterality: N/A;   DENTAL SURGERY     Removed right eye     RIGHT/LEFT HEART CATH AND CORONARY ANGIOGRAPHY N/A 12/19/2021   Procedure: RIGHT/LEFT HEART CATH AND CORONARY ANGIOGRAPHY;  Surgeon: Sherren Mocha, MD;  Location: [facility];  Service: Cardiovascular;  Laterality: N/A;    Current Medications: Current Meds  Medication Sig   aspirin EC 81 MG tablet Take 1 tablet (81 mg total) by mouth daily.   Blood Pressure Monitoring (BLOOD PRESSURE CUFF) MISC Take Blood Pressure Daily   brimonidine (ALPHAGAN) 0.15 % ophthalmic solution Place 1 drop into the left eye daily.   clopidogrel (PLAVIX) 75 MG tablet Take 1 tablet (75 mg total) by mouth daily.   dorzolamide-timolol (COSOPT) 22.3-6.8 MG/ML ophthalmic solution Place 1 drop into the left eye daily.   hydrochlorothiazide (HYDRODIURIL) 25 MG tablet Take 1 tablet (25 mg total) by mouth daily.   losartan (COZAAR) 25 MG tablet Take 1 tablet (25 mg total) by mouth daily.   nitroGLYCERIN (NITROSTAT) 0.4 MG SL tablet Place 1 tablet (0.4 mg total) under the tongue every 5 (five) minutes as needed for chest pain.   ROCKLATAN 0.02-0.005 % SOLN Place 1 drop into the left eye at bedtime.   [DISCONTINUED] atorvastatin (LIPITOR) 80 MG tablet Take 1 tablet (80 mg total) by mouth daily.   [DISCONTINUED] furosemide (LASIX) 20 MG tablet Take 1 tablet (20 mg total) by mouth daily.     Allergies:   Patient has no known allergies.   Social History   Socioeconomic History   Marital status: Married    Spouse name: Not on file   Number of children: Not on file   Years of education: Not on file   Highest education level: Not on file  Occupational History   Not on file  Tobacco Use   Smoking status: Former    Packs/day: 0.25    Types: Cigarettes    Quit date: 12/24/2021     Years since quitting: 0.4   Smokeless tobacco: Never   Tobacco comments:    occassionally   Substance and Sexual Activity   Alcohol use: Yes    Comment: socially   Drug use: Yes    Types: Marijuana    Comment: Marijuana about 1 week ago.   Sexual activity: Not on file  Other Topics Concern   Not on file  Social History Narrative   Not on file   Social Determinants of Health   Financial Resource Strain: High Risk (09/01/2019)   Overall Financial Resource Strain (CARDIA)    Difficulty of Paying Living Expenses: Very hard  Food Insecurity: Food Insecurity Present (09/01/2019)   Hunger Vital Sign    Worried About Running Out of Food in the Last Year: Often true    Ran Out of Food in the Last Year: Often true  Transportation Needs:  Unmet Transportation Needs (09/01/2019)   PRAPARE - Hydrologist (Medical): Yes    Lack of Transportation (Non-Medical): Yes  Physical Activity: Not on file  Stress: Not on file  Social Connections: Not on file     Family History: The patient's family history includes Diabetes in his brother; Heart disease in his mother; Kidney disease in his mother. There is no history of Colon cancer, Esophageal cancer, Rectal cancer, Stomach cancer, Prostate cancer, or Pancreatic cancer.  ROS:   Please see the history of present illness.     All other systems reviewed and are negative.  EKGs/Labs/Other Studies Reviewed:    The following studies were reviewed today:  Heart monitor 2020 Patient had a min HR of 28 bpm, max HR of 146 bpm, and avg HR of 63 bpm. Predominant underlying rhythm was Sinus Rhythm.    Bradycardia noted frequently.   Runs of sinus bradycardia with occasional sinus pauses. Infrequent ventricular ectopy with paired PVCs Brief runs of junctional rhythm.  EKG:  EKG is  ordered today.  The ekg ordered today demonstrates sinus bradycardia with HR 57  Recent Labs: 12/20/2021: BUN 12; Creatinine, Ser 1.07; Hemoglobin  14.2; Platelets 229; Potassium 4.3; Sodium 137  Recent Lipid Panel    Component Value Date/Time   CHOL 127 12/20/2021 0409   CHOL 189 01/27/2020 1623   TRIG 101 12/20/2021 0409   HDL 40 (L) 12/20/2021 0409   HDL 50 01/27/2020 1623   CHOLHDL 3.2 12/20/2021 0409   VLDL 20 12/20/2021 0409   LDLCALC 67 12/20/2021 0409   LDLCALC 123 (H) 01/27/2020 1623     Risk Assessment/Calculations:          Physical Exam:    VS:  BP (!) 154/72 (BP Location: Left Arm, Patient Position: Sitting, Cuff Size: Large)   Pulse 72   Ht '5\' 4"'$  (1.626 m)   Wt 210 lb 6.4 oz (95.4 kg)   SpO2 98%   BMI 36.12 kg/m     Wt Readings from Last 3 Encounters:  06/05/22 210 lb 6.4 oz (95.4 kg)  04/28/22 208 lb 9.6 oz (94.6 kg)  04/03/22 205 lb 6.4 oz (93.2 kg)     GEN:  Well nourished, well developed in no acute distress HEENT: Normal NECK: No JVD; No carotid bruits LYMPHATICS: No lymphadenopathy CARDIAC: RRR, no murmurs, rubs, gallops RESPIRATORY:  Clear to auscultation without rales, wheezing or rhonchi  ABDOMEN: Soft, non-tender, non-distended MUSCULOSKELETAL:  No edema; No deformity  SKIN: Warm and dry NEUROLOGIC:  Alert and oriented x 3 PSYCHIATRIC:  Normal affect   ASSESSMENT:    1. Coronary artery disease of native artery of native heart with stable angina pectoris ([place])   2. CAD S/P percutaneous coronary angioplasty   3. Essential hypertension   4. Dizziness   5. Palpitations   6. Dyspnea, unspecified type   7. Sinus bradycardia   8. Essential hypertension, benign   9. Hyperlipidemia LDL goal <70   10. Former smoker    PLAN:    In order of problems listed above:  Shortness of breath Brilinta switched to Plavix PFTs were mildly abnormal.  He denies SOB, but describes fatigue with exertion today. Will hold off on pulmonology referral for now.   Bradycardia with sinus pauses Exertional fatigue Lightheadedness / pre-syncope Hx of bradycardia and pauses. No BB. Has seen EP but did  not plan PPM. He describes feeling like his heart pauses and presyncope. Question if bradycardia and pauses  have progressed and now symptomatic. Will place 14 day zio patch.   CAD s/p DES-LAD 12/2021 Longterm DAPT recommended for plaque rupture ASA and plavix No BB given bradycardia and sinus pauses No chest or arm pain, does not sound like he is having his anginal equivalent symptoms.   Hyperlipidemia with LDL goal < 70 12/20/2021: Cholesterol 127; HDL 40; LDL Cholesterol 67; Triglycerides 101; VLDL 20 Continue high intensity statin   Hypertension HCTZ was increased to 25 mg Lasix taken off med list Hx of losartan, but not on his med list now Will start 25 mg losartan   Former smoker Congratulated him on his success   Follow up with Dr. Percival Spanish after monitor results.      Medication Adjustments/Labs and Tests Ordered: Current medicines are reviewed at length with the patient today.  Concerns regarding medicines are outlined above.  Orders Placed This Encounter  Procedures   Basic metabolic panel   LONG TERM MONITOR (3-14 DAYS)   Meds ordered this encounter  Medications   atorvastatin (LIPITOR) 80 MG tablet    Sig: Take 1 tablet (80 mg total) by mouth daily.    Dispense:  90 tablet    Refill:  3   losartan (COZAAR) 25 MG tablet    Sig: Take 1 tablet (25 mg total) by mouth daily.    Dispense:  90 tablet    Refill:  3    Patient Instructions  Medication Instructions:  Start Losartan 25 mg daily Continue all other medications *If you need a refill on your cardiac medications before your next appointment, please call your pharmacy*   Lab Work: Have a bmet in 10 days  Lab order enclosed   Testing/Procedures: Schedule 14 day Heart Monitor ( Zio )    Follow-Up: At [facility], you and your health needs are our priority.  As part of our continuing mission to provide you with exceptional heart care, we have created designated Provider Care Teams.  These  Care Teams include your primary Cardiologist (physician) and Advanced Practice Providers (APPs -  Physician Assistants and Nurse Practitioners) who all work together to provide you with the care you need, when you need it.  We recommend signing up for the patient portal called "MyChart".  Sign up information is provided on this After Visit Summary.  MyChart is used to connect with patients for Virtual Visits (Telemedicine).  Patients are able to view lab/test results, encounter notes, upcoming appointments, etc.  Non-urgent messages can be sent to your provider as well.   To learn more about what you can do with MyChart, go to [email or web address].    Your next appointment:  6 weeks    Provider:  Dr.Hochrein     Signed, [place], PA  06/05/2022 9:09 AM    [place]

## 2022-06-05 ENCOUNTER — Encounter: Payer: Self-pay | Admitting: Physician Assistant

## 2022-06-05 ENCOUNTER — Ambulatory Visit: Payer: 59 | Attending: Physician Assistant

## 2022-06-05 ENCOUNTER — Ambulatory Visit: Payer: 59 | Attending: Physician Assistant | Admitting: Physician Assistant

## 2022-06-05 VITALS — BP 154/72 | HR 72 | Ht 64.0 in | Wt 210.4 lb

## 2022-06-05 DIAGNOSIS — R001 Bradycardia, unspecified: Secondary | ICD-10-CM | POA: Diagnosis not present

## 2022-06-05 DIAGNOSIS — R42 Dizziness and giddiness: Secondary | ICD-10-CM | POA: Diagnosis not present

## 2022-06-05 DIAGNOSIS — R002 Palpitations: Secondary | ICD-10-CM

## 2022-06-05 DIAGNOSIS — I2510 Atherosclerotic heart disease of native coronary artery without angina pectoris: Secondary | ICD-10-CM | POA: Diagnosis not present

## 2022-06-05 DIAGNOSIS — Z9861 Coronary angioplasty status: Secondary | ICD-10-CM | POA: Diagnosis not present

## 2022-06-05 DIAGNOSIS — I25118 Atherosclerotic heart disease of native coronary artery with other forms of angina pectoris: Secondary | ICD-10-CM

## 2022-06-05 DIAGNOSIS — I10 Essential (primary) hypertension: Secondary | ICD-10-CM | POA: Diagnosis not present

## 2022-06-05 DIAGNOSIS — Z87891 Personal history of nicotine dependence: Secondary | ICD-10-CM

## 2022-06-05 DIAGNOSIS — E785 Hyperlipidemia, unspecified: Secondary | ICD-10-CM

## 2022-06-05 DIAGNOSIS — R0600 Dyspnea, unspecified: Secondary | ICD-10-CM

## 2022-06-05 MED ORDER — LOSARTAN POTASSIUM 25 MG PO TABS: 25.0000 mg | ORAL_TABLET | Freq: Every day | ORAL | 3 refills | Status: DC

## 2022-06-05 MED ORDER — ATORVASTATIN CALCIUM 80 MG PO TABS: 80.0000 mg | ORAL_TABLET | Freq: Every day | ORAL | 3 refills | Status: DC

## 2022-06-05 NOTE — Progress Notes (Unsigned)
Enrolled patient for a 14 day Zio XT monitor to be mailed to patients home   [id number] mailed to patient/ applied in office on 06/19/22.  Dr Percival Spanish to read

## 2022-06-05 NOTE — Patient Instructions (Signed)
Medication Instructions:  Start Losartan 25 mg daily Continue all other medications *If you need a refill on your cardiac medications before your next appointment, please call your pharmacy*   Lab Work: Have a bmet in 10 days  Lab order enclosed   Testing/Procedures: Schedule 14 day Heart Monitor ( Zio )    Follow-Up: At [facility], you and your health needs are our priority.  As part of our continuing mission to provide you with exceptional heart care, we have created designated Provider Care Teams.  These Care Teams include your primary Cardiologist (physician) and Advanced Practice Providers (APPs -  Physician Assistants and Nurse Practitioners) who all work together to provide you with the care you need, when you need it.  We recommend signing up for the patient portal called "MyChart".  Sign up information is provided on this After Visit Summary.  MyChart is used to connect with patients for Virtual Visits (Telemedicine).  Patients are able to view lab/test results, encounter notes, upcoming appointments, etc.  Non-urgent messages can be sent to your provider as well.   To learn more about what you can do with MyChart, go to [email or web address].    Your next appointment:  6 weeks    Provider:  Dr.Hochrein

## 2022-06-11 ENCOUNTER — Telehealth: Payer: Self-pay | Admitting: Cardiology

## 2022-06-11 NOTE — Telephone Encounter (Signed)
Called patient wife, advised that they have not received their monitor yet.   Looks like it was in process on 01/18-   Advised I would route to our monitor team to see if they knew of any update.   Thanks!

## 2022-06-11 NOTE — Telephone Encounter (Signed)
Patient's wife is calling stating the patient is wanting an update on the heart monitor discussed being ordered at this last appt. Please advise.

## 2022-06-11 NOTE — Telephone Encounter (Signed)
Called patient wife, LVM- advised of message below.    Advised to call back if questions/concerns.

## 2022-06-17 LAB — BASIC METABOLIC PANEL
BUN/Creatinine Ratio: 18 (ref 10–24)
BUN: 17 mg/dL (ref 8–27)
CO2: 19 mmol/L — ABNORMAL LOW (ref 20–29)
Calcium: 9.6 mg/dL (ref 8.6–10.2)
Chloride: 103 mmol/L (ref 96–106)
Creatinine, Ser: 0.93 mg/dL (ref 0.76–1.27)
Glucose: 66 mg/dL — ABNORMAL LOW (ref 70–99)
Potassium: 5.6 mmol/L — ABNORMAL HIGH (ref 3.5–5.2)
Sodium: 141 mmol/L (ref 134–144)
eGFR: 84 mL/min/{1.73_m2} (ref >59)

## 2022-06-19 ENCOUNTER — Ambulatory Visit: Payer: 59 | Attending: Internal Medicine

## 2022-06-20 ENCOUNTER — Other Ambulatory Visit: Payer: Self-pay | Admitting: *Deleted

## 2022-06-25 LAB — LAB REPORT - SCANNED: EGFR: 76.4

## 2022-07-17 DIAGNOSIS — R0602 Shortness of breath: Secondary | ICD-10-CM | POA: Insufficient documentation

## 2022-07-17 NOTE — Progress Notes (Signed)
Cardiology Office Note   Date:  07/18/2022   ID:  Todd Ferguson, DOB 1941-02-14, MRN [id number]  PCP:  Jilda Panda, MD  Cardiologist:   Minus Breeding, MD   Chief Complaint  Patient presents with   Coronary Artery Disease     History of Present Illness: Todd Ferguson is a 82 y.o. 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  He has had trouble affording his meds.   However, now he has been getting these routinely.  He has been compliant with his meds.  He has been doing a little walking for exercise. The patient denies any new symptoms such as chest discomfort, neck or arm discomfort. There has been no new shortness of breath, PND or orthopnea. There have been no reported palpitations, presyncope or syncope.   I do note his blood pressure is elevated.  I looked back and he is been on lisinopril in the past but it was stopped because of hyperkalemia.  I actually see that the last basic metabolic profile demonstrates elevated potassium.    Of note he had some bradycardic heart rhythm when he was seen last by Doreene Adas PA PhD.  A monitor was placed and I reviewed this for this visit.  The predominant rhythm was sinus.  He had 7 beats of SVT longest run and occasional shorter runs.  He did have sinus pauses lasting up to 4 seconds but these were during sleeping hours.  He had a brief run of junctional rhythm and.  However again he has not had presyncope or syncope.   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   Hyperlipidemia with target LDL less than 70    Hypertension    Sinus pause    3.9-4.5 sec documented, asymptomatic   Tobacco abuse     Past Surgical History:  Procedure Laterality  Date   APPENDECTOMY     CORONARY STENT INTERVENTION N/A 12/19/2021   Procedure: CORONARY STENT INTERVENTION;  Surgeon: Sherren Mocha, MD;  Location: [facility];  Service: Cardiovascular;  Laterality: N/A;   DENTAL SURGERY     Removed right eye     RIGHT/LEFT HEART CATH AND CORONARY ANGIOGRAPHY N/A 12/19/2021   Procedure: RIGHT/LEFT HEART CATH AND CORONARY ANGIOGRAPHY;  Surgeon: Sherren Mocha, MD;  Location: [place];  Service: Cardiovascular;  Laterality: N/A;     Current Outpatient Medications  Medication Sig Dispense Refill   amLODipine (NORVASC) 2.5 MG tablet Take 1 tablet (2.5 mg total) by mouth daily. 90 tablet 3   aspirin EC 81 MG tablet Take 1 tablet (81 mg total) by mouth daily. 30 tablet 6   atorvastatin (LIPITOR) 80 MG tablet Take 1 tablet (80 mg total) by mouth daily. 90 tablet 3   Blood Pressure Monitoring (BLOOD PRESSURE CUFF) MISC Take Blood Pressure Daily 1 each 0   brimonidine (ALPHAGAN) 0.15 % ophthalmic solution Place 1 drop into the left eye daily.     clopidogrel (PLAVIX) 75 MG tablet Take 1 tablet (75 mg total) by mouth daily. 90 tablet 3   dorzolamide-timolol (COSOPT) 22.3-6.8 MG/ML ophthalmic solution Place 1 drop into  the left eye daily.     hydrochlorothiazide (HYDRODIURIL) 25 MG tablet Take 1 tablet (25 mg total) by mouth daily. 90 tablet 3   nitroGLYCERIN (NITROSTAT) 0.4 MG SL tablet Place 1 tablet (0.4 mg total) under the tongue every 5 (five) minutes as needed for chest pain. 25 tablet 3   ROCKLATAN 0.02-0.005 % SOLN Place 1 drop into the left eye at bedtime.     No current facility-administered medications for this visit.    Allergies:   Patient has no known allergies.    ROS:  Please see the history of present illness.   Otherwise, review of systems are positive for none.  All other systems are reviewed and negative.    PHYSICAL EXAM: VS:  BP (!) 160/72   Pulse 83   Ht '5\' 4"'$  (1.626 m)   Wt 205 lb 9.6 oz (93.3 kg)   SpO2 99%   BMI  35.29 kg/m  , BMI Body mass index is 35.29 kg/m. GENERAL:  Well appearing NECK:  No jugular venous distention, waveform within normal limits, carotid upstroke brisk and symmetric, positive right greater than left bruits, no thyromegaly LUNGS:  Clear to auscultation bilaterally CHEST:  Unremarkable HEART:  PMI not displaced or sustained,S1 and S2 within normal limits, no S3, no S4, no clicks, no rubs, 2 out of 6 brief apical and axillary systolic murmur, no diastolic murmurs ABD:  Flat, positive bowel sounds normal in frequency in pitch, no bruits, no rebound, no guarding, no midline pulsatile mass, no hepatomegaly, no splenomegaly EXT:  2 plus pulses throughout, no edema, no cyanosis no clubbing    August 2023 Cath  Diagnostic Dominance: Right  Intervention       EKG:  EKG is not ordered today.    Recent Labs: 12/20/2021: Hemoglobin 14.2; Platelets 229 06/16/2022: BUN 17; Creatinine, Ser 0.93; Potassium 5.6; Sodium 141    Lipid Panel    Component Value Date/Time   CHOL 127 12/20/2021 0409   CHOL 189 01/27/2020 1623   TRIG 101 12/20/2021 0409   HDL 40 (L) 12/20/2021 0409   HDL 50 01/27/2020 1623   CHOLHDL 3.2 12/20/2021 0409   VLDL 20 12/20/2021 0409   LDLCALC 67 12/20/2021 0409   LDLCALC 123 (H) 01/27/2020 1623      Wt Readings from Last 3 Encounters:  07/18/22 205 lb 9.6 oz (93.3 kg)  06/05/22 210 lb 6.4 oz (95.4 kg)  04/28/22 208 lb 9.6 oz (94.6 kg)      Other studies Reviewed: Additional studies/ records that were reviewed today include: Labs Review of the above records demonstrates:  Please see elsewhere in the note.     ASSESSMENT AND PLAN:  CAD: The patient has no new sypmtoms.  No further cardiovascular testing is indicated.  We will continue with aggressive risk reduction and meds as listed.  Bradycardia: He is not having any symptomatic dysrhythmias.  No change in therapy.  HTN:   I will check a basic metabolic profile again today.  I am going  to start amlodipine 2.5 mg daily.  He has been on this in the past and I do not see a contraindication.   Dyslipidemia: LDL was  67.  HDL was 40.  No change in therapy.   BRUIT: I will check carotid Doppler.  Current medicines are reviewed at length with the patient today.  The patient does not have concerns regarding medicines.  The following changes have been made: As above  Labs/ tests ordered today include:  Orders Placed This Encounter  Procedures   Basic Metabolic Panel (BMET)   VAS US CAROTID     Disposition:   FU with Angie Duke in 2 months   Signed, Minus Breeding, MD  07/18/2022 3:14 PM    [place]

## 2022-07-18 ENCOUNTER — Encounter: Payer: Self-pay | Admitting: Cardiology

## 2022-07-18 ENCOUNTER — Ambulatory Visit: Payer: 59 | Attending: Cardiology | Admitting: Cardiology

## 2022-07-18 ENCOUNTER — Other Ambulatory Visit: Payer: Self-pay

## 2022-07-18 VITALS — BP 160/72 | HR 83 | Ht 64.0 in | Wt 205.6 lb

## 2022-07-18 DIAGNOSIS — I25118 Atherosclerotic heart disease of native coronary artery with other forms of angina pectoris: Secondary | ICD-10-CM

## 2022-07-18 DIAGNOSIS — I10 Essential (primary) hypertension: Secondary | ICD-10-CM

## 2022-07-18 DIAGNOSIS — R0602 Shortness of breath: Secondary | ICD-10-CM | POA: Diagnosis not present

## 2022-07-18 DIAGNOSIS — R0989 Other specified symptoms and signs involving the circulatory and respiratory systems: Secondary | ICD-10-CM

## 2022-07-18 DIAGNOSIS — E785 Hyperlipidemia, unspecified: Secondary | ICD-10-CM

## 2022-07-18 MED ORDER — AMLODIPINE BESYLATE 2.5 MG PO TABS
2.5000 mg | ORAL_TABLET | Freq: Every day | ORAL | 3 refills | Status: DC
  Filled 2022-07-18 – 2022-07-28 (×2): qty 90, 90d supply, fill #0

## 2022-07-18 NOTE — Patient Instructions (Signed)
Medication Instructions:   START AMLODIPINE 2.5 MG ONCE DAILY  *If you need a refill on your cardiac medications before your next appointment, please call your pharmacy*   Testing/Procedures:  Your physician has requested that you have a carotid duplex. This test is an ultrasound of the carotid arteries in your neck. It looks at blood flow through these arteries that supply the brain with blood. Allow one hour for this exam. There are no restrictions or special instructions. [facility]   Follow-Up: At [facility], you and your health needs are our priority.  As part of our continuing mission to provide you with exceptional heart care, we have created designated Provider Care Teams.  These Care Teams include your primary Cardiologist (physician) and Advanced Practice Providers (APPs -  Physician Assistants and Nurse Practitioners) who all work together to provide you with the care you need, when you need it.  We recommend signing up for the patient portal called "MyChart".  Sign up information is provided on this After Visit Summary.  MyChart is used to connect with patients for Virtual Visits (Telemedicine).  Patients are able to view lab/test results, encounter notes, upcoming appointments, etc.  Non-urgent messages can be sent to your provider as well.   To learn more about what you can do with MyChart, go to [email or web address].    Your next appointment:   2 month(s)  Provider:   Doreene Adas PA

## 2022-07-19 LAB — BASIC METABOLIC PANEL
BUN/Creatinine Ratio: 15 (ref 10–24)
BUN: 16 mg/dL (ref 8–27)
CO2: 19 mmol/L — ABNORMAL LOW (ref 20–29)
Calcium: 10.2 mg/dL (ref 8.6–10.2)
Chloride: 104 mmol/L (ref 96–106)
Creatinine, Ser: 1.08 mg/dL (ref 0.76–1.27)
Glucose: 95 mg/dL (ref 70–99)
Potassium: 5.1 mmol/L (ref 3.5–5.2)
Sodium: 142 mmol/L (ref 134–144)
eGFR: 70 mL/min/{1.73_m2} (ref >59)

## 2022-07-21 ENCOUNTER — Encounter: Payer: Self-pay | Admitting: *Deleted

## 2022-07-25 ENCOUNTER — Other Ambulatory Visit: Payer: Self-pay

## 2022-07-28 ENCOUNTER — Other Ambulatory Visit: Payer: Self-pay

## 2022-07-28 ENCOUNTER — Ambulatory Visit (HOSPITAL_COMMUNITY)
Admission: RE | Admit: 2022-07-28 | Discharge: 2022-07-28 | Disposition: A | Discharge status: Home / Self Care | Payer: 59 | Source: Ambulatory Visit | Attending: Cardiology | Admitting: Cardiology

## 2022-07-28 DIAGNOSIS — R0989 Other specified symptoms and signs involving the circulatory and respiratory systems: Secondary | ICD-10-CM | POA: Diagnosis present

## 2022-07-29 ENCOUNTER — Other Ambulatory Visit: Payer: Self-pay

## 2022-10-09 NOTE — Progress Notes (Unsigned)
error 

## 2022-10-11 NOTE — Progress Notes (Signed)
Cardiology Office Note:    Date:  10/16/2022   ID:  Todd Ferguson, DOB October 26, 1940, MRN [id number]  PCP:  Ralene Ok, MD   [facility] Providers Cardiologist:  Rollene Rotunda, MD Cardiology APP:  Alver Sorrow, NP  Electrophysiologist:  Sherryl Manges, MD     Referring MD: Ralene Ok, MD   Chief Complaint  Patient presents with   Follow-up    Daytime fatigue    History of Present Illness:    Todd Ferguson is a 82 y.o. male with a hx of Todd Ferguson is a 82 y.o. male with a hx of CAD s/p DES-LAD 12/2021, bradycardia with sinus pause (EP did not recommend PPM), HTN, and tobacco abuse.   Echo 01/2019 with LVEF 60-65%, RV normal, and mild aortic stenosis. Afib was noted during this echo but not captured on heart monitor later in the same year. Was not anticoagulated. POET 01/2019 was not ischemic, no chronotropic incompetence. Heart monitor 03/2019 showed min HR 28 with frequent bradycardia and occasional sinus pauses. He was referred to EP but PPM not recommended given absence of symptoms. Avoid AV nodal blocking agents. He has declined sleep study in the past.   He was seen in 12/2021 with symptoms concerning for angina and was scheduled for heart catheterization. LHC showed severe proximal LAD stenosis treated with 3.5 x 24 mm DES with mild residual diffuse plaquing in RCA, PDA, LCX, and intermediate branches with severe stenosis in the small RCA/PLA. Dr. Excell Seltzer favored DAPT with brilinta since angiographically he appears to have plaque rupture. Smoking cessation was strongly recommended.   Telemetry did show sinus bradycardia with junctional escape beats and sinus pauses up to 3.91 sec. I confirmed that he was awake and asymptomatic during this pause.  No beta-blocker.  He was last seen by Dr. Antoine Poche 04/03/2022 with shortness of breath.  Dr. Antoine Poche did not think his symptoms were reminiscent of his prior angina.  Secondary risk reduction was stressed.  He was  switched from Brilinta to Plavix.  PFTs obtained and were mildly abnormal.  Monitor response on Plavix and refer to pulmonology if ongoing shortness of breath. I saw him in follow up on 04/28/22. He had been out of medications including plavix for a few days. I was able to call [facility] and get them mailed. I gave him 8 tablets of brilinta to get him through to mail order delivery with very strict instructions to discontinue once he had plavix. Could not adequately evaluate him without medications in place, no labs. I saw him for close follow up 06/05/22 and he denied further SOB but reported exertional fatigue and lightheadedness / pre-syncope. Given history of bradycardia and pauses on prior heart monitor, I opted to repeat a heart monitor which showed sinus pauses lasting up to 4 second during the sleeping hours, and 2.3% PVCs. Carotid artery ultrasound showed only minimal plaque. He was seen by Dr. Antoine Poche 07/18/22 and was not having symptomatic bradycardia. Amlodipine 2.5 mg was added to his HTN regimen.   He returns today for scheduled follow up.  He denies chest pain and shortness of breath. He continues to report daytime fatigue. StopBang 7. I discussed sleep apnea and offered itamar - he does not want to come to the lab for a sleep study.   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   Hyperlipidemia with target LDL less than 70    Hypertension    Sinus pause    3.9-4.5 sec documented, asymptomatic   Tobacco abuse     Past Surgical History:  Procedure Laterality Date   APPENDECTOMY     CORONARY STENT INTERVENTION N/A 12/19/2021   Procedure: CORONARY STENT INTERVENTION;  Surgeon: Tonny Bollman, MD;   Location: [facility] INVASIVE CV LAB;  Service: Cardiovascular;  Laterality: N/A;   DENTAL SURGERY     Removed right eye     RIGHT/LEFT HEART CATH AND CORONARY ANGIOGRAPHY N/A 12/19/2021   Procedure: RIGHT/LEFT HEART CATH AND CORONARY ANGIOGRAPHY;  Surgeon: Tonny Bollman, MD;  Location: [facility] INVASIVE CV LAB;  Service: Cardiovascular;  Laterality: N/A;    Current Medications: Current Meds  Medication Sig   amLODipine (NORVASC) 2.5 MG tablet Take 1 tablet (2.5 mg total) by mouth daily.   aspirin EC 81 MG tablet Take 1 tablet (81 mg total) by mouth daily.   atorvastatin (LIPITOR) 80 MG tablet Take 1 tablet (80 mg total) by mouth daily.   Blood Pressure Monitoring (BLOOD PRESSURE CUFF) MISC Take Blood Pressure Daily   brimonidine (ALPHAGAN) 0.15 % ophthalmic solution Place 1 drop into the left eye daily.   clopidogrel (PLAVIX) 75 MG tablet Take 1 tablet (75 mg total) by mouth daily.   dorzolamide-timolol (COSOPT) 22.3-6.8 MG/ML ophthalmic solution Place 1 drop into the left eye daily.   hydrochlorothiazide (HYDRODIURIL) 25 MG tablet Take 1 tablet (25 mg total) by mouth daily.   nitroGLYCERIN (NITROSTAT) 0.4 MG SL tablet Place 1 tablet (0.4 mg total) under the tongue every 5 (five) minutes as needed for chest pain.   ROCKLATAN 0.02-0.005 % SOLN Place 1 drop into the left eye at bedtime.     Allergies:   Patient has no known allergies.   Social History   Socioeconomic History   Marital status: Married    Spouse name: Not on file   Number of children: Not on file   Years of education: Not on file   Highest education level: Not on file  Occupational History   Not on file  Tobacco Use   Smoking status: Former    Packs/day: .25    Types: Cigarettes    Quit date: 12/24/2021    Years since quitting: 0.8   Smokeless tobacco: Never   Tobacco comments:    occassionally   Substance and Sexual Activity   Alcohol use: Yes    Comment: socially   Drug use: Yes    Types: Marijuana    Comment:  Marijuana about 1 week ago.   Sexual activity: Not on file  Other Topics Concern   Not on file  Social History Narrative   Not on file   Social Determinants of Health   Financial Resource Strain: High Risk (09/01/2019)   Overall Financial Resource Strain (CARDIA)    Difficulty of Paying Living Expenses: Very hard  Food Insecurity: Food Insecurity Present (09/01/2019)   Hunger Vital Sign    Worried About Running Out of Food in the Last Year: Often true    Ran Out of Food in the Last Year: Often true  Transportation Needs: Unmet Transportation Needs (09/01/2019)   PRAPARE - Administrator, Civil Service (Medical): Yes    Lack  of Transportation (Non-Medical): Yes  Physical Activity: Not on file  Stress: Not on file  Social Connections: Not on file     Family History: The patient's family history includes Diabetes in his brother; Heart disease in his mother; Kidney disease in his mother. There is no history of Colon cancer, Esophageal cancer, Rectal cancer, Stomach cancer, Prostate cancer, or Pancreatic cancer.  ROS:   Please see the history of present illness.     All other systems reviewed and are negative.  EKGs/Labs/Other Studies Reviewed:    The following studies were reviewed today:  Heart monitor 06/2022 Predominant rhythm is normal sinus rhythm SVT with the longest being 7 beats.   There are sinus pauses lasting up to 4 seconds.  These seem to happen during the sleeping hours There was brief junctional rhythm Occasional isolated PVCs 2.3% of beats Rare ventricular couplets.and ventricular bigeminy  EKG:  EKG is not ordered today.   Recent Labs: 12/20/2021: Hemoglobin 14.2; Platelets 229 07/18/2022: BUN 16; Creatinine, Ser 1.08; Potassium 5.1; Sodium 142  Recent Lipid Panel    Component Value Date/Time   CHOL 127 12/20/2021 0409   CHOL 189 01/27/2020 1623   TRIG 101 12/20/2021 0409   HDL 40 (L) 12/20/2021 0409   HDL 50 01/27/2020 1623   CHOLHDL 3.2  12/20/2021 0409   VLDL 20 12/20/2021 0409   LDLCALC 67 12/20/2021 0409   LDLCALC 123 (H) 01/27/2020 1623     Risk Assessment/Calculations:      HYPERTENSION CONTROL Vitals:   10/16/22 1005 10/16/22 1035  BP: (!) 152/72 (!) 140/70    The patient's blood pressure is elevated above target today.  In order to address the patient's elevated BP: A current anti-hypertensive medication was adjusted today.       STOP-Bang Score:  7       Physical Exam:    VS:  BP (!) 140/70   Pulse (!) 59   Ht 5\' 4"  (1.626 m)   Wt 205 lb 12.8 oz (93.4 kg)   SpO2 100%   BMI 35.33 kg/m     Wt Readings from Last 3 Encounters:  10/16/22 205 lb 12.8 oz (93.4 kg)  07/18/22 205 lb 9.6 oz (93.3 kg)  06/05/22 210 lb 6.4 oz (95.4 kg)     GEN:  Well nourished, well developed in no acute distress HEENT: Normal NECK: No JVD; No carotid bruits LYMPHATICS: No lymphadenopathy CARDIAC: RRR, no murmurs, rubs, gallops RESPIRATORY:  Clear to auscultation without rales, wheezing or rhonchi  ABDOMEN: Soft, non-tender, non-distended MUSCULOSKELETAL:  No edema; No deformity  SKIN: Warm and dry NEUROLOGIC:  Alert and oriented x 3 PSYCHIATRIC:  Normal affect   ASSESSMENT:    1. Snoring   2. Daytime somnolence   3. CAD S/P percutaneous coronary angioplasty   4. SOB (shortness of breath)   5. Hyperlipidemia with target LDL less than 70   6. Primary hypertension   7. Sinus bradycardia   8. Essential hypertension    PLAN:    In order of problems listed above:  Shortness of breath Brilinta switched to Plavix PFTs were mildly abnormal. Doing better on plavix, hold off on pulmonology referral No SOB reported today   CAD s/p DES-LAD 12/2021 Longterm DAPT recommended for plaque rupture ASA and plavix No BB given bradycardia and sinus pauses No angina, continue risk factor modification   Hyperlipidemia with LDL goal < 70 12/20/2021: Cholesterol 127; HDL 40; LDL Cholesterol 67; Triglycerides 101; VLDL  20 Continue high  intensity statin Consider repeat labs 12/2022 - will check lipid at that time   Hypertension 25 mg HCTZ, 2.5 mg amlodipine BP still elevated today I will increase amlodipine to 5 mg   Bradycardia with sinus pauses Has seen EP in the past Recent heart monitor with 4 sec pauses at night Question sleep apnea No change in medications Avoid BB   Snorting Daytime fatigue HTN StopBang score 7 Will do itamar, he does not want to come into the lab   Follow up in 2 months with BP log.     Medication Adjustments/Labs and Tests Ordered: Current medicines are reviewed at length with the patient today.  Concerns regarding medicines are outlined above.  Orders Placed This Encounter  Procedures   Itamar Sleep Study   No orders of the defined types were placed in this encounter.   Patient Instructions  Medication Instructions:  No change *If you need a refill on your cardiac medications before your next appointment, please call your pharmacy*   Lab Work: Need Lipids drawn with PCP in June  If you have labs (blood work) drawn today and your tests are completely normal, you will receive your results only by: MyChart Message (if you have MyChart) OR A paper copy in the mail If you have any lab test that is abnormal or we need to change your treatment, we will call you to review the results.   Testing/Procedures: WatchPAT?  Is a FDA cleared portable home sleep study test that uses a watch and 3 points of contact to monitor 7 different channels, including your heart rate, oxygen saturations, body position, snoring, and chest motion.  The study is easy to use from the comfort of your own home and accurately detect sleep apnea.  Before bed, you attach the chest sensor, attached the sleep apnea bracelet to your nondominant hand, and attach the finger probe.  After the study, the raw data is downloaded from the watch and scored for apnea events.   For more information:  [email or web address]  Patient Testing Instructions:  Do not put battery into the device until bedtime when you are ready to begin the test. Please call the support number if you need assistance after following the instructions below: 24 hour support line- [phone] or ITAMAR support at [phone] (option 2)  Download the IntelWatchPAT One" app through the google play store or App Store  Be sure to turn on or enable access to bluetooth in settlings on your smartphone/ device  Make sure no other bluetooth devices are on and within the vicinity of your smartphone/ device and WatchPAT watch during testing.  Make sure to leave your smart phone/ device plugged in and charging all night.  When ready for bed:  Follow the instructions step by step in the WatchPAT One App to activate the testing device. For additional instructions, including video instruction, visit the WatchPAT One video on Youtube. You can search for WatchPat One within Youtube (video is 4 minutes and 18 seconds) or enter: https://youtube/watch?[email or web address]_vbiwxE Please note: You will be prompted to enter a Pin to connect via bluetooth when starting the test. The PIN will be assigned to you when you receive the test.  The device is disposable, but it recommended that you retain the device until you receive a call letting you know the study has been received and the results have been interpreted.  We will let you know if the study did not transmit to [place] properly after the test is completed. You  do not need to call us to confirm the receipt of the test.  Please complete the test within 48 hours of receiving PIN.   Frequently Asked Questions:  What is Watch Dennie Bible one?  A single use fully disposable home sleep apnea testing device and will not need to be returned after completion.  What are the requirements to use WatchPAT one?  The be able to have a successful watchpat one sleep study, you should have your Watch pat one  device, your smart phone, watch pat one app, your PIN number and Internet access What type of phone do I need?  You should have a smart phone that uses Android 5.1 and above or any Iphone with IOS 10 and above How can I download the WatchPAT one app?  Based on your device type search for WatchPAT one app either in google play for android devices or APP store for Iphone's Where will I get my PIN for the study?  Your PIN will be provided by your physician's office. It is used for authentication and if you lose/forget your PIN, please reach out to your providers office.  I do not have Internet at home. Can I do WatchPAT one study?  WatchPAT One needs Internet connection throughout the night to be able to transmit the sleep data. You can use your home/local internet or your cellular's data package. However, it is always recommended to use home/local Internet. It is estimated that between 20MB-30MB will be used with each study.However, the application will be looking for space in the phone to start the study.  What happens if I lose internet or bluetooth connection?  During the internet disconnection, your phone will not be able to transmit the sleep data. All the data, will be stored in your phone. As soon as the internet connection is back on, the phone will being sending the sleep data. During the bluetooth disconnection, WatchPAT one will not be able to to send the sleep data to your phone. Data will be kept in the [facility] one until two devices have bluetooth connection back on. As soon as the connection is back on, WatchPAT one will send the sleep data to the phone.  How long do I need to wear the WatchPAT one?  After you start the study, you should wear the device at least 6 hours.  How far should I keep my phone from the device?  During the night, your phone should be within 15 feet.  What happens if I leave the room for restroom or other reasons?  Leaving the room for any reason will not  cause any problem. As soon as your get back to the room, both devices will reconnect and will continue to send the sleep data. Can I use my phone during the sleep study?  Yes, you can use your phone as usual during the study. But it is recommended to put your watchpat one on when you are ready to go to bed.  How will I get my study results?  A soon as you completed your study, your sleep data will be sent to the provider. They will then share the results with you when they are ready.     Follow-Up: At [facility], you and your health needs are our priority.  As part of our continuing mission to provide you with exceptional heart care, we have created designated Provider Care Teams.  These Care Teams include your primary Cardiologist (physician) and Advanced Practice Providers (APPs -  Physician Assistants and Nurse Practitioners) who all work together to provide you with the care you need, when you need it.  We recommend signing up for the patient portal called "MyChart".  Sign up information is provided on this After Visit Summary.  MyChart is used to connect with patients for Virtual Visits (Telemedicine).  Patients are able to view lab/test results, encounter notes, upcoming appointments, etc.  Non-urgent messages can be sent to your provider as well.   To learn more about what you can do with MyChart, go to [email or web address].    Your next appointment:   6 month(s)  Provider:   Micah Flesher, PA or Dr Antoine Poche     Signed, Roe Rutherford [place], [place]  10/16/2022 10:35 AM    [facility]

## 2022-10-16 ENCOUNTER — Other Ambulatory Visit: Payer: Self-pay

## 2022-10-16 ENCOUNTER — Ambulatory Visit: Payer: 59 | Attending: Physician Assistant | Admitting: Physician Assistant

## 2022-10-16 ENCOUNTER — Encounter: Payer: Self-pay | Admitting: Physician Assistant

## 2022-10-16 VITALS — BP 140/70 | HR 59 | Ht 64.0 in | Wt 205.8 lb

## 2022-10-16 DIAGNOSIS — R0602 Shortness of breath: Secondary | ICD-10-CM

## 2022-10-16 DIAGNOSIS — R0683 Snoring: Secondary | ICD-10-CM

## 2022-10-16 DIAGNOSIS — I2510 Atherosclerotic heart disease of native coronary artery without angina pectoris: Secondary | ICD-10-CM | POA: Diagnosis not present

## 2022-10-16 DIAGNOSIS — Z9861 Coronary angioplasty status: Secondary | ICD-10-CM

## 2022-10-16 DIAGNOSIS — R001 Bradycardia, unspecified: Secondary | ICD-10-CM

## 2022-10-16 DIAGNOSIS — R400 Somnolence: Secondary | ICD-10-CM | POA: Diagnosis not present

## 2022-10-16 DIAGNOSIS — E785 Hyperlipidemia, unspecified: Secondary | ICD-10-CM

## 2022-10-16 DIAGNOSIS — I10 Essential (primary) hypertension: Secondary | ICD-10-CM

## 2022-10-16 MED ORDER — AMLODIPINE BESYLATE 5 MG PO TABS
5.0000 mg | ORAL_TABLET | Freq: Every day | ORAL | 3 refills | Status: DC
  Filled 2022-10-16 – 2022-10-23 (×2): qty 90, 90d supply, fill #0

## 2022-10-16 NOTE — Patient Instructions (Addendum)
Medication Instructions:  Increase Amlodipine to 5 mg a day *If you need a refill on your cardiac medications before your next appointment, please call your pharmacy*   Lab Work: Need Lipids drawn with PCP in June  If you have labs (blood work) drawn today and your tests are completely normal, you will receive your results only by: MyChart Message (if you have MyChart) OR A paper copy in the mail If you have any lab test that is abnormal or we need to change your treatment, we will call you to review the results.   Testing/Procedures: WatchPAT?  Is a FDA cleared portable home sleep study test that uses a watch and 3 points of contact to monitor 7 different channels, including your heart rate, oxygen saturations, body position, snoring, and chest motion.  The study is easy to use from the comfort of your own home and accurately detect sleep apnea.  Before bed, you attach the chest sensor, attached the sleep apnea bracelet to your nondominant hand, and attach the finger probe.  After the study, the raw data is downloaded from the watch and scored for apnea events.   For more information: [email or web address]  Patient Testing Instructions:  Do not put battery into the device until bedtime when you are ready to begin the test. Please call the support number if you need assistance after following the instructions below: 24 hour support line- [phone] or ITAMAR support at [phone] (option 2)  Download the IntelWatchPAT One" app through the google play store or App Store  Be sure to turn on or enable access to bluetooth in settlings on your smartphone/ device  Make sure no other bluetooth devices are on and within the vicinity of your smartphone/ device and WatchPAT watch during testing.  Make sure to leave your smart phone/ device plugged in and charging all night.  When ready for bed:  Follow the instructions step by step in the WatchPAT One App to activate the  testing device. For additional instructions, including video instruction, visit the WatchPAT One video on Youtube. You can search for WatchPat One within Youtube (video is 4 minutes and 18 seconds) or enter: https://youtube/watch?[email or web address]_vbiwxE Please note: You will be prompted to enter a Pin to connect via bluetooth when starting the test. The PIN will be assigned to you when you receive the test.  The device is disposable, but it recommended that you retain the device until you receive a call letting you know the study has been received and the results have been interpreted.  We will let you know if the study did not transmit to [place] properly after the test is completed. You do not need to call us to confirm the receipt of the test.  Please complete the test within 48 hours of receiving PIN.   Frequently Asked Questions:  What is Watch Dennie Bible one?  A single use fully disposable home sleep apnea testing device and will not need to be returned after completion.  What are the requirements to use WatchPAT one?  The be able to have a successful watchpat one sleep study, you should have your Watch pat one device, your smart phone, watch pat one app, your PIN number and Internet access What type of phone do I need?  You should have a smart phone that uses Android 5.1 and above or any Iphone with IOS 10 and above How can I download the WatchPAT one app?  Based on your device type search for WatchPAT one app either  in google play for android devices or APP store for Iphone's Where will I get my PIN for the study?  Your PIN will be provided by your physician's office. It is used for authentication and if you lose/forget your PIN, please reach out to your providers office.  I do not have Internet at home. Can I do WatchPAT one study?  WatchPAT One needs Internet connection throughout the night to be able to transmit the sleep data. You can use your home/local internet or your cellular's data package. However, it  is always recommended to use home/local Internet. It is estimated that between 20MB-30MB will be used with each study.However, the application will be looking for space in the phone to start the study.  What happens if I lose internet or bluetooth connection?  During the internet disconnection, your phone will not be able to transmit the sleep data. All the data, will be stored in your phone. As soon as the internet connection is back on, the phone will being sending the sleep data. During the bluetooth disconnection, WatchPAT one will not be able to to send the sleep data to your phone. Data will be kept in the [facility] one until two devices have bluetooth connection back on. As soon as the connection is back on, WatchPAT one will send the sleep data to the phone.  How long do I need to wear the WatchPAT one?  After you start the study, you should wear the device at least 6 hours.  How far should I keep my phone from the device?  During the night, your phone should be within 15 feet.  What happens if I leave the room for restroom or other reasons?  Leaving the room for any reason will not cause any problem. As soon as your get back to the room, both devices will reconnect and will continue to send the sleep data. Can I use my phone during the sleep study?  Yes, you can use your phone as usual during the study. But it is recommended to put your watchpat one on when you are ready to go to bed.  How will I get my study results?  A soon as you completed your study, your sleep data will be sent to the provider. They will then share the results with you when they are ready.     Follow-Up: At [facility], you and your health needs are our priority.  As part of our continuing mission to provide you with exceptional heart care, we have created designated Provider Care Teams.  These Care Teams include your primary Cardiologist (physician) and Advanced Practice Providers (APPs -  Physician  Assistants and Nurse Practitioners) who all work together to provide you with the care you need, when you need it.  We recommend signing up for the patient portal called "MyChart".  Sign up information is provided on this After Visit Summary.  MyChart is used to connect with patients for Virtual Visits (Telemedicine).  Patients are able to view lab/test results, encounter notes, upcoming appointments, etc.  Non-urgent messages can be sent to your provider as well.   To learn more about what you can do with MyChart, go to [email or web address].    Your next appointment:   2 months with Dr Antoine Poche or Micah Flesher, PA  Keep a BP log and bring it with you to your appointment.

## 2022-10-23 ENCOUNTER — Other Ambulatory Visit: Payer: Self-pay

## 2022-10-30 ENCOUNTER — Other Ambulatory Visit: Payer: Self-pay

## 2022-11-18 ENCOUNTER — Telehealth: Payer: Self-pay | Admitting: Cardiology

## 2022-11-18 NOTE — Telephone Encounter (Signed)
Wife called and stated patient has not been himself and has been getting short breath quickly. BP this morning was 180/90 she is not sure what his heart rate was.  She states he denies any chest pain, headache, dizziness, nausea or vomiting. Does have some swelling in his feet. Discussed ED precautions. Has appointment for tomorrow at 11:40. She verbalized understanding

## 2022-11-18 NOTE — Telephone Encounter (Signed)
Pt c/o Shortness Of Breath: STAT if SOB developed within the last 24 hours or pt is noticeably SOB on the phone  1. Are you currently SOB (can you hear that pt is SOB on the phone)? Spoke with wife   2. How long have you been experiencing SOB? She first stated for a "minute".  I had to ask her to clarify want she ment by a "minute".  If it was a month, she then said yes a month.    3. Are you SOB when sitting or when up moving around? Both, mainly SOB when he walks around.   4. Are you currently experiencing any other symptoms? When asked if any other symptoms she said "the SOB of was enough."  She said he needs to be seen sooner than next month.

## 2022-11-18 NOTE — Progress Notes (Deleted)
Cardiology Office Note:   Date:  11/18/2022  ID:  Todd Ferguson, DOB 1941-02-12, MRN [id number] PCP: Ralene Ok, MD  [facility] Providers Cardiologist:  Rollene Rotunda, MD Cardiology APP:  Alver Sorrow, NP  Electrophysiologist:  Sherryl Manges, MD {  History of Present Illness:   Todd Ferguson is a 82 y.o. male with sinus pause (EP did not recommend PPM), HTN, and tobacco abuse. Echo 01/2019 with LVEF 60-65%, RV normal, and mild aortic stenosis. Afib was noted during this echo but not captured on heart monitor later in the same year. Was not anticoagulated. POET 01/2019 was not ischemic, no chronotropic incompetence. Heart monitor 03/2019 showed min HR 28 with frequent bradycardia and occasional sinus pauses. He was referred to EP but PPM not recommended given absence of symptoms. Avoid AV nodal blocking agents. He has declined sleep study in the past. He was seen in 12/2021 with symptoms concerning for angina and was scheduled for heart catheterization. LHC showed severe proximal LAD stenosis treated with 3.5 x 24 mm DES with mild residual diffuse plaquing in RCA, PDA, LCX, and intermediate branches with severe stenosis in the small RCA/PLA. Dr. Excell Seltzer favored DAPT with brilinta since angiographically he appears to have plaque rupture. Smoking cessation was strongly recommended. Telemetry did show sinus bradycardia with junctional escape beats and sinus pauses up to 3.91 sec. I confirmed that he was awake and asymptomatic during this pause.  No beta-blocker.  He was last seen 04/03/2022 with shortness of breath.  He was switched from Brilinta to Plavix.  PFTs obtained and were mildly abnormal.  Monitor response on Plavix and referred  to pulmonology if ongoing shortness of breath.  Heart monitor which showed sinus pauses lasting up to 4 second during the sleeping hours, and 2.3% PVCs. Carotid artery ultrasound showed only minimal plaque. He was seen by Dr. Antoine Poche 07/18/22 and was not  having symptomatic bradycardia. Amlodipine 2.5 mg was added to his HTN regimen.   ***     ***  I saw him in follow up on 04/28/22. He had been out of medications including plavix for a few days. I was able to call community health and wellness and get them mailed. I gave him 8 tablets of brilinta to get him through to mail order delivery with very strict instructions to discontinue once he had plavix. Could not adequately evaluate him without medications in place, no labs. I saw him for close follow up 06/05/22 and he denied further SOB but reported exertional fatigue and lightheadedness / pre-syncope. Given history of bradycardia and pauses on prior heart monitor, I opted to repeat a heart monitor which showed sinus pauses lasting up to 4 second during the sleeping hours, and 2.3% PVCs. Carotid artery ultrasound showed only minimal plaque. He was seen by Dr. Antoine Poche 07/18/22 and was not having symptomatic bradycardia. Amlodipine 2.5 mg was added to his HTN regimen.   He returns today for scheduled follow up.  He denies chest pain and shortness of breath. He continues to report daytime fatigue. StopBang 7. I discussed sleep apnea and offered itamar - he does not want to come to the lab for a sleep study.  ROS: ***  Studies Reviewed:    EKG:       ***  Risk Assessment/Calculations:   {Does this patient have ATRIAL FIBRILLATION?[phone]} No BP recorded.  {Refresh Note OR Click here to enter BP  :1}***   STOP-Bang Score:  7  { Consider Dx Sleep Disordered  Breathing or Sleep Apnea  ICD G47.33          :1}     Physical Exam:   VS:  There were no vitals taken for this visit.   Wt Readings from Last 3 Encounters:  10/16/22 205 lb 12.8 oz (93.4 kg)  07/18/22 205 lb 9.6 oz (93.3 kg)  06/05/22 210 lb 6.4 oz (95.4 kg)     GEN: Well nourished, well developed in no acute distress NECK: No JVD; No carotid bruits CARDIAC: ***RRR, no murmurs, rubs, gallops RESPIRATORY:  Clear to auscultation  without rales, wheezing or rhonchi  ABDOMEN: Soft, non-tender, non-distended EXTREMITIES:  No edema; No deformity   ASSESSMENT AND PLAN:   Shortness of breath:  ***   Brilinta switched to Plavix PFTs were mildly abnormal. 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:  ***   Longterm DAPT recommended for plaque rupture ASA and plavix No BB given bradycardia and sinus pauses No angina, continue risk factor modification     Hyperlipidemia:  ***   with LDL goal < 70 12/20/2021: Cholesterol 127; HDL 40; LDL Cholesterol 67; Triglycerides 101; VLDL 20 Continue high intensity statin Consider repeat labs 12/2022 - will check lipid at that time     Hypertension:  ***  25 mg HCTZ, 2.5 mg amlodipine BP still elevated today I will increase amlodipine to 5 mg     Bradycardia with sinus pauses:  ***  Has seen EP in the past Recent heart monitor with 4 sec pauses at night Question sleep apnea No change in medications Avoid BB     Snoring:  Home sleep study was ordered but ***  Daytime fatigue HTN StopBang score 7 Will do itamar, he does not want to come into the lab        {Are you ordering a CV Procedure (e.g. stress test, cath, DCCV, TEE, etc)?   Press F2        :[id number]}  Follow up ***  Signed, Rollene Rotunda, MD

## 2022-11-19 ENCOUNTER — Ambulatory Visit: Payer: 59 | Admitting: Cardiology

## 2022-11-19 DIAGNOSIS — I2510 Atherosclerotic heart disease of native coronary artery without angina pectoris: Secondary | ICD-10-CM

## 2022-11-19 DIAGNOSIS — R001 Bradycardia, unspecified: Secondary | ICD-10-CM

## 2022-11-19 DIAGNOSIS — R0683 Snoring: Secondary | ICD-10-CM

## 2022-11-19 DIAGNOSIS — I10 Essential (primary) hypertension: Secondary | ICD-10-CM

## 2022-11-19 DIAGNOSIS — R0602 Shortness of breath: Secondary | ICD-10-CM

## 2022-11-19 DIAGNOSIS — E785 Hyperlipidemia, unspecified: Secondary | ICD-10-CM

## 2022-12-18 DIAGNOSIS — I25118 Atherosclerotic heart disease of native coronary artery with other forms of angina pectoris: Secondary | ICD-10-CM | POA: Insufficient documentation

## 2022-12-18 NOTE — Progress Notes (Deleted)
Cardiology Office Note:   Date:  12/18/2022  ID:  Todd Ferguson, DOB [birth year], MRN [id number] PCP: Ralene Ok, MD  [facility] Providers Cardiologist:  Rollene Rotunda, MD Cardiology APP:  Alver Sorrow, NP  Electrophysiologist:  Sherryl Manges, MD {  History of Present Illness:   Todd Ferguson is a 82 y.o. 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  He has seen Micah Flesher PAc and had Brilinta changed to Plavix.  He had a sleep study ordered.  ***      ***  He has had trouble affording his meds.   However, now he has been getting these routinely.  He has been compliant with his meds.  He has been doing a little walking for exercise. The patient denies any new symptoms such as chest discomfort, neck or arm discomfort. There has been no new shortness of breath, PND or orthopnea. There have been no reported palpitations, presyncope or syncope.   I do note his blood pressure is elevated.  I looked back and he is been on lisinopril in the past but it was stopped because of hyperkalemia.  I actually see that the last basic metabolic profile demonstrates elevated potassium.     Of note he had some bradycardic heart rhythm when he was seen last by Bettina Gavia PA PhD.  A monitor was placed and I reviewed this for this visit.  The predominant rhythm was sinus.  He had 7 beats of SVT longest run and occasional shorter runs.  He did have sinus pauses lasting up to 4 seconds but these were during sleeping hours.  He had a brief run of junctional rhythm and.  However again he has not had presyncope or syncope.    ROS: ***  Studies Reviewed:    EKG:       ***  Risk Assessment/Calculations:   {Does this patient have ATRIAL FIBRILLATION?:[phone]} No BP recorded.  {Refresh Note OR Click here to enter BP  :1}***   STOP-Bang Score:  7  { Consider Dx Sleep Disordered Breathing or Sleep Apnea  ICD G47.33          :1}     Physical Exam:   VS:  There were no  vitals taken for this visit.   Wt Readings from Last 3 Encounters:  10/16/22 205 lb 12.8 oz (93.4 kg)  07/18/22 205 lb 9.6 oz (93.3 kg)  06/05/22 210 lb 6.4 oz (95.4 kg)     GEN: Well nourished, well developed in no acute distress NECK: No JVD; No carotid bruits CARDIAC: ***RRR, no murmurs, rubs, gallops RESPIRATORY:  Clear to auscultation without rales, wheezing or rhonchi  ABDOMEN: Soft, non-tender, non-distended EXTREMITIES:  No edema; No deformity   ASSESSMENT AND PLAN:   CAD: ***  The patient has no new sypmtoms.  No further cardiovascular testing is indicated.  We will continue with aggressive risk reduction and meds as listed.   Bradycardia: ***  He is not having any symptomatic dysrhythmias.  No change in therapy.   HTN:   ***  I will check a basic metabolic profile again today.  I am going to start amlodipine 2.5 mg daily.  He has been on this in the past and I do not see a contraindication.    Dyslipidemia: LDL was *** 67.  HDL was 40.  No change in therapy.       {Are you ordering a CV Procedure (e.g. stress test,  cath, DCCV, TEE, etc)?   Press F2        :[id number]}  Follow up ***  Signed, Rollene Rotunda, MD

## 2022-12-19 ENCOUNTER — Ambulatory Visit: Payer: 59 | Admitting: Cardiology

## 2022-12-19 DIAGNOSIS — I25118 Atherosclerotic heart disease of native coronary artery with other forms of angina pectoris: Secondary | ICD-10-CM

## 2022-12-19 DIAGNOSIS — E785 Hyperlipidemia, unspecified: Secondary | ICD-10-CM

## 2022-12-19 DIAGNOSIS — I10 Essential (primary) hypertension: Secondary | ICD-10-CM

## 2022-12-19 DIAGNOSIS — R001 Bradycardia, unspecified: Secondary | ICD-10-CM

## 2022-12-23 ENCOUNTER — Other Ambulatory Visit: Payer: Self-pay

## 2022-12-25 ENCOUNTER — Telehealth: Payer: Self-pay | Admitting: Cardiology

## 2022-12-25 ENCOUNTER — Other Ambulatory Visit: Payer: Self-pay

## 2022-12-25 DIAGNOSIS — I25118 Atherosclerotic heart disease of native coronary artery with other forms of angina pectoris: Secondary | ICD-10-CM

## 2022-12-25 DIAGNOSIS — E785 Hyperlipidemia, unspecified: Secondary | ICD-10-CM

## 2022-12-25 DIAGNOSIS — R42 Dizziness and giddiness: Secondary | ICD-10-CM

## 2022-12-25 DIAGNOSIS — I2510 Atherosclerotic heart disease of native coronary artery without angina pectoris: Secondary | ICD-10-CM

## 2022-12-25 MED ORDER — ATORVASTATIN CALCIUM 80 MG PO TABS
80.0000 mg | ORAL_TABLET | Freq: Every day | ORAL | 2 refills | Status: AC
Start: 2022-12-25 — End: ?

## 2022-12-25 MED ORDER — ATORVASTATIN CALCIUM 80 MG PO TABS
80.0000 mg | ORAL_TABLET | Freq: Every day | ORAL | 2 refills | Status: DC
Start: 2022-12-25 — End: 2022-12-25
  Filled 2022-12-25: qty 90, 90d supply, fill #0

## 2022-12-25 NOTE — Telephone Encounter (Signed)
*  STAT* If patient is at the pharmacy, call can be transferred to refill team.   1. Which medications need to be refilled? (please list name of each medication and dose if known) atorvastatin (LIPITOR) 80 MG tablet  2. Which pharmacy/location (including street and city if local pharmacy) is medication to be sent to?[facility] [phone] - [place], [place] - [address]   3. Do they need a 30 day or 90 day supply? 90 Day Supply

## 2022-12-25 NOTE — Telephone Encounter (Signed)
Pt's medication was sent to pt's pharmacy as requested. Confirmation received.  °

## 2022-12-25 NOTE — Progress Notes (Signed)
  Cardiology Office Note:   Date:  12/26/2022  ID:  Todd Ferguson, DOB 1940/07/25, MRN [id number] PCP: Todd Ok, MD  [facility] Providers Cardiologist:  Todd Rotunda, MD Cardiology APP:  Todd Sorrow, NP  Electrophysiologist:  Todd Manges, MD {  History of Present Illness:   Todd Ferguson is a 82 y.o. 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    Since he was last seen he seems to have done better.   The patient denies any new symptoms such as chest discomfort, neck or arm discomfort. There has been no new shortness of breath, PND or orthopnea. There have been no reported palpitations, presyncope or syncope.    He is unhoused.  He has had trouble getting his medications but he seems to be taking the 4 prescriptions that are given him.  He also takes aspirin.  He walks with a walker.  He is not having any of the symptoms that he had previously.  ROS: As stated in the HPI and negative for all other systems.    Studies Reviewed:    EKG:   EKG Interpretation Date/Time:  Friday December 26 2022 16:38:11 EDT Ventricular Rate:  83 PR Interval:  158 QRS Duration:  80 QT Interval:  360 QTC Calculation: 423 R Axis:   34  Text Interpretation: Normal sinus rhythm When compared with ECG of 20-Dec-2021 06:42, T wave inversion no longer evident in Anterolateral leads is no longer present Confirmed by Todd Ferguson ([id number]) on 12/26/2022 5:03:38 PM    Risk Assessment/Calculations:     Physical Exam:   VS:  BP (!) 154/74 (BP Location: Right Arm, Patient Position: Sitting, Cuff Size: Large)   Pulse 83   Ht 5\' 4"  (1.626 m)   Wt 204 lb (92.5 kg)   SpO2 95%   BMI 35.02 kg/m    Wt Readings from Last 3 Encounters:  12/26/22 204 lb (92.5 kg)  10/16/22 205 lb 12.8 oz (93.4 kg)  07/18/22 205 lb 9.6 oz (93.3 kg)     GEN: Well nourished, well developed in no acute distress NECK: No JVD; No carotid bruits CARDIAC: RRR, no murmurs, rubs,  gallops RESPIRATORY:  Clear to auscultation without rales, wheezing or rhonchi  ABDOMEN: Soft, non-tender, non-distended EXTREMITIES:  No edema; No deformity   ASSESSMENT AND PLAN:    CAD s/p DES:  The patient has no new sypmtoms.  No further cardiovascular testing is indicated.  We will continue with aggressive risk reduction and meds as listed.   Hyperlipidemia :  LDL goal is less than 70.  His LDL was 67.  No change in therapy.    Hypertension: The blood pressure is not at target.  And then increase amlodipine to 10 mg daily.  Bradycardia with sinus pauses: The patient has had no symptomatic pauses.  No change in therapy.    Snoring: We gave the patient the [facility] for a STOP-BANG of 7.          Follow up with Todd Gavia PhD, PAc   Signed, Todd Rotunda, MD

## 2022-12-26 ENCOUNTER — Encounter: Payer: Self-pay | Admitting: Cardiology

## 2022-12-26 ENCOUNTER — Other Ambulatory Visit: Payer: Self-pay

## 2022-12-26 ENCOUNTER — Ambulatory Visit: Payer: 59 | Attending: Cardiology | Admitting: Cardiology

## 2022-12-26 VITALS — BP 154/74 | HR 83 | Ht 64.0 in | Wt 204.0 lb

## 2022-12-26 DIAGNOSIS — R0602 Shortness of breath: Secondary | ICD-10-CM

## 2022-12-26 DIAGNOSIS — E785 Hyperlipidemia, unspecified: Secondary | ICD-10-CM | POA: Diagnosis not present

## 2022-12-26 DIAGNOSIS — I2510 Atherosclerotic heart disease of native coronary artery without angina pectoris: Secondary | ICD-10-CM | POA: Diagnosis not present

## 2022-12-26 DIAGNOSIS — Z9861 Coronary angioplasty status: Secondary | ICD-10-CM

## 2022-12-26 DIAGNOSIS — R001 Bradycardia, unspecified: Secondary | ICD-10-CM | POA: Diagnosis not present

## 2022-12-26 MED ORDER — AMLODIPINE BESYLATE 10 MG PO TABS
10.0000 mg | ORAL_TABLET | Freq: Every day | ORAL | 3 refills | Status: DC
  Filled 2022-12-26: qty 90, 90d supply, fill #0

## 2022-12-26 NOTE — Patient Instructions (Addendum)
Medication Instructions:  Increase Amlodipine to 10 mg ( Take 1 Tablet Daily). *If you need a refill on your cardiac medications before your next appointment, please call your pharmacy*   Lab Work: No Labs If you have labs (blood work) drawn today and your tests are completely normal, you will receive your results only by: MyChart Message (if you have MyChart) OR A paper copy in the mail If you have any lab test that is abnormal or we need to change your treatment, we will call you to review the results.   Testing/Procedures: No Testing   Follow-Up: At [facility], you and your health needs are our priority.  As part of our continuing mission to provide you with exceptional heart care, we have created designated Provider Care Teams.  These Care Teams include your primary Cardiologist (physician) and Advanced Practice Providers (APPs -  Physician Assistants and Nurse Practitioners) who all work together to provide you with the care you need, when you need it.  We recommend signing up for the patient portal called "MyChart".  Sign up information is provided on this After Visit Summary.  MyChart is used to connect with patients for Virtual Visits (Telemedicine).  Patients are able to view lab/test results, encounter notes, upcoming appointments, etc.  Non-urgent messages can be sent to your provider as well.   To learn more about what you can do with MyChart, go to [email or web address].    Your next appointment:   Keep Schedule appointment  Provider:   Micah Flesher, PA-C

## 2022-12-29 ENCOUNTER — Telehealth: Payer: Self-pay | Admitting: Licensed Clinical Social Worker

## 2022-12-29 ENCOUNTER — Other Ambulatory Visit: Payer: Self-pay

## 2022-12-29 NOTE — Telephone Encounter (Signed)
H&V Care Navigation CSW Progress Note  Clinical Social Worker contacted patient by phone to f/u on referral for assistance. All SDOH domains were noted. Pt has Medicare and Medicaid so should have full coverage for all medications and medical care at this time. Was unable to reach pt about other concerns at [phone], left voicemail requesting return call. Will re-attempt as able. Noted this is spouse's number (DPR on file).  Patient is participating in a Managed Medicaid Plan:  No, UHC Dual Complete- Medicare and Medicaid  SDOH Screenings   Food Insecurity: Food Insecurity Present (09/01/2019)  Housing: High Risk (09/01/2019)  Transportation Needs: Unmet Transportation Needs (09/01/2019)  Financial Resource Strain: High Risk (09/01/2019)  Tobacco Use: Medium Risk (12/26/2022)   Octavio Graves, MSW, LCSW Clinical Social Worker II [facility] Heart/Vascular Care Navigation  [phone]- work cell phone (preferred) [phone]- desk phone

## 2022-12-31 ENCOUNTER — Telehealth: Payer: Self-pay | Admitting: Licensed Clinical Social Worker

## 2022-12-31 NOTE — Progress Notes (Signed)
Heart and Vascular Care Navigation  12/31/2022  Todd Ferguson 10-26-1940 [id number]  Reason for Referral: comprehensive SDOH challenges Patient is participating in a Managed Medicaid Plan: No, United [employer] and Medicaid [employer] with patient by telephone for initial visit for Heart and Vascular Care Coordination.                                                                                                   Assessment:    LCSW was able to reach pt and pt spouse Todd Ferguson this afternoon at [phone]. Introduced self, role, reason for call. Pt hard of hearing and there is lots of background noise they state they cannot turn off therefore pt spouse does most of the speaking.   Pt and pt spouse currently unhoused and have been since 2019, I note that they previously had been sent resources and spoke with team lead Annice Pih, LCSW, but then did not return her follow up call. They stay intermittently at hotels, rent rooms and "the bus stop." The listed address is pt spouse's mother and they cannot stay there. When I asked if they had contact with any housing assistance groups and pt wife states she has talked to everyone and "nobody does anything." She is unable to share any specifics.   Pt receives social security check, money on a "U Card" from insurance and a small amount of food stamps monthly. Pt spouse has been approved for food stamps but has no current income and previously had applied/been denied for disability.        Pt has access to his medications, they are $0 copay with his Medicare and Medicaid. Pt has access to [employer] transportation and benefits through his [employer].   Pt spouse requests assistance with housing, food and community resources to find new PCP and look at insurance enrollment to ensure pt has best plan.  LCSW completed VISPDAT with pt and pt spouse and explained I would refer them to Coordinated Entry for additional assistance.  I will mail them resources our clinic has for the requested items above. Will f/u before end of the week or Monday if I have received any updates.                             HRT/VAS Care Coordination     Patients Home Cardiology Office [facility]   Outpatient Care Team Social Worker   Social Worker Name: Octavio Graves, [place], [phone]   Living arrangements for the past 2 months Homeless   Lives with: Spouse   Patient Current Insurance Coverage Managed Medicare; Medicaid   Patient Has Concern With Paying Medical Bills No   Does Patient Have Prescription Coverage? Yes   Home Assistive Devices/Equipment Walker (specify type)       Social History:  SDOH Screenings   Food Insecurity: Food Insecurity Present (12/31/2022)  Housing: Medium Risk (12/31/2022)  Transportation Needs: No Transportation Needs (12/31/2022)  Utilities: Not At Risk (12/31/2022)  Financial Resource Strain: Medium Risk (12/31/2022)  Tobacco Use: Medium Risk (12/26/2022)    SDOH Interventions: Financial Resources:  Financial Strain Interventions: Other (Comment) (mailed housing resources, senior line, [employer] information, food resources) DSS for financial assistance  Food Insecurity:  Food Insecurity Interventions: Other (Comment) (pt sent community food resources; pt and pt wife both recieve SNAP)  Housing Insecurity:  Housing Interventions: Other (Comment) (referred to Coordinated Entry and completed VISPDAT; mailed additional housing resources)  Transportation:   Transportation Interventions: SCAT Psychologist, clinical)    Other Care Navigation Interventions:     Provided Pharmacy assistance resources  Pt and pt wife denies any issues obtaining medications at this time, they are $0 copay for pt.    Follow-up plan:   LCSW securely sent VISPDAT to Coordinated Entry team for review. LCSW also mailed pt and pt  spouse housing resources from [employer], Optician, dispensing. I also sent a list of pantry/food resources and Catering manager. PCP list also mailed as pt spouse wants to change pt current PCP. Will f/u once I have any updates and to provide check in.

## 2023-01-01 ENCOUNTER — Telehealth: Payer: Self-pay | Admitting: Licensed Clinical Social Worker

## 2023-01-01 NOTE — Telephone Encounter (Signed)
H&V Care Navigation CSW Progress Note  Clinical Child psychotherapist  received return email from [place], with [facility]  to update me that since we are not recognized with [facility] that we aren't able to send VISPDATS. This is  a new development, responded to seek some additional clarity. In the meantime I was able to send standard referral form to their team and they will reach out to pt. I will f/u to let pt and pt spouse know tomorrow.  Patient is participating in a Managed Medicaid Plan:  No UHC Medicare and Medicaid  SDOH Screenings   Food Insecurity: Food Insecurity Present (12/31/2022)  Housing: Medium Risk (12/31/2022)  Transportation Needs: No Transportation Needs (12/31/2022)  Utilities: Not At Risk (12/31/2022)  Financial Resource Strain: Medium Risk (12/31/2022)  Tobacco Use: Medium Risk (12/26/2022)    Octavio Graves, MSW, LCSW Clinical Social Worker II [facility] Heart/Vascular Care Navigation  [phone]- work cell phone (preferred) [phone]- desk phone

## 2023-01-02 ENCOUNTER — Telehealth (HOSPITAL_BASED_OUTPATIENT_CLINIC_OR_DEPARTMENT_OTHER): Payer: Self-pay | Admitting: Licensed Clinical Social Worker

## 2023-01-02 NOTE — Telephone Encounter (Signed)
H&V Care Navigation CSW Progress Note  Clinical Social Worker contacted patient by phone to f/u on resources sent and referral to [facility] Entry. Was able to reach pt wife Archie Patten this morning at [phone]. Shared that referral has been received by [facility] and they should be contacting pt, reminded her to check voicemail daily in case she misses a call. LCSW also shared that the resources I had mailed through should arrive hopefully next week. Pt wife thanked me for update. Will f/u next week to ensure they hear from Coordinated Entry team. No additional questions at this time.   Patient is participating in a Managed Medicaid Plan:  No, UHC Medicare and Medicaid  SDOH Screenings   Food Insecurity: Food Insecurity Present (12/31/2022)  Housing: Medium Risk (12/31/2022)  Transportation Needs: No Transportation Needs (12/31/2022)  Utilities: Not At Risk (12/31/2022)  Financial Resource Strain: Medium Risk (12/31/2022)  Tobacco Use: Medium Risk (12/26/2022)    Octavio Graves, MSW, LCSW Clinical Social Worker II [facility] Heart/Vascular Care Navigation  [phone]- work cell phone (preferred) [phone]- desk phone

## 2023-01-07 ENCOUNTER — Other Ambulatory Visit: Payer: Self-pay

## 2023-01-08 ENCOUNTER — Telehealth: Payer: Self-pay | Admitting: Licensed Clinical Social Worker

## 2023-01-08 NOTE — Telephone Encounter (Signed)
H&V Care Navigation CSW Progress Note  Clinical Social Worker contacted caregiver by phone (pt wife Archie Patten, [place] on file, has the cell phone) to f/u on resources and referral. No answer this morning at [phone], left voicemail. Remain available as needed, will re-attempt again if I do not hear from pt/pt wife.  Patient is participating in a Managed Medicaid Plan:  No, UHC Medicare and Medicaid  SDOH Screenings   Food Insecurity: Food Insecurity Present (12/31/2022)  Housing: Medium Risk (12/31/2022)  Transportation Needs: No Transportation Needs (12/31/2022)  Utilities: Not At Risk (12/31/2022)  Financial Resource Strain: Medium Risk (12/31/2022)  Tobacco Use: Medium Risk (12/26/2022)    Octavio Graves, MSW, LCSW Clinical Social Worker II [facility] Heart/Vascular Care Navigation  [phone]- work cell phone (preferred) [phone]- desk phone

## 2023-01-12 NOTE — Telephone Encounter (Signed)
H&V Care Navigation CSW Progress Note  Clinical Social Worker  contacted Coordinated Entry team  to f/u on the referral sent to their staff for pt and pt wife. They reached back to me and confirmed that they have spoken with pt and advised him on how to enroll in their housing support.  Patient is participating in a Managed Medicaid Plan:  No. Multimedia programmer and Medicaid  SDOH Screenings   Food Insecurity: Food Insecurity Present (12/31/2022)  Housing: Medium Risk (12/31/2022)  Transportation Needs: No Transportation Needs (12/31/2022)  Utilities: Not At Risk (12/31/2022)  Financial Resource Strain: Medium Risk (12/31/2022)  Tobacco Use: Medium Risk (12/26/2022)    Octavio Graves, MSW, LCSW Clinical Social Worker II [facility] Heart/Vascular Care Navigation  [phone]- work cell phone (preferred) [phone]- desk phone

## 2023-04-16 NOTE — Progress Notes (Signed)
Cardiology Office Note:    Date:  04/27/2023   ID:  Todd Ferguson, DOB 02-20-1941, MRN [id number]  PCP:  Ralene Ok, MD   [facility] Providers Cardiologist:  Rollene Rotunda, MD Cardiology APP:  Alver Sorrow, NP Kateri Mc Roe [place], [place]  Electrophysiologist:  Sherryl Manges, MD     Referring MD: Ralene Ok, MD   Chief Complaint  Patient presents with   Follow-up    2 months.    History of Present Illness:    Todd Ferguson is a 82 y.o. male with a hx of CAD s/p DES-LAD 12/2021, bradycardia with sinus pause (EP did not recommend PPM), HTN, and tobacco abuse.   Echo 01/2019 with LVEF 60-65%, RV normal, and mild aortic stenosis. Afib was noted during this echo but not captured on heart monitor later in the same year. Was not anticoagulated. POET 01/2019 was not ischemic, no chronotropic incompetence. Heart monitor 03/2019 showed min HR 28 with frequent bradycardia and occasional sinus pauses. He was referred to EP but PPM not recommended given absence of symptoms. Avoid AV nodal blocking agents. He has declined sleep study in the past.   He was seen in 12/2021 with symptoms concerning for angina and was scheduled for heart catheterization. LHC showed severe proximal LAD stenosis treated with 3.5 x 24 mm DES with mild residual diffuse plaquing in RCA, PDA, LCX, and intermediate branches with severe stenosis in the small RCA/PLA. Dr. Excell Seltzer favored DAPT with brilinta since angiographically he appears to have plaque rupture. Smoking cessation was strongly recommended.   Telemetry did show sinus bradycardia with junctional escape beats and sinus pauses up to 3.91 sec. Heart monitor with longest pause 45 sec. I confirmed that he was awake and asymptomatic during this pause.  No beta-blocker.  He was seen by Dr. Antoine Poche 04/03/2022 with shortness of breath.  Dr. Antoine Poche did not think his symptoms were reminiscent of his prior angina.  Secondary risk reduction was stressed.   He was switched from Brilinta to Plavix.  PFTs obtained and were mildly abnormal.  Monitor response on Plavix and refer to pulmonology if ongoing shortness of breath. I saw him in follow up on 04/28/22. He had been out of medications including plavix for a few days. I was able to call community health and wellness and get them mailed. I gave him 8 tablets of brilinta to get him through to mail order delivery with very strict instructions to discontinue once he had plavix. Could not adequately evaluate him without medications in place, no labs. I saw him for close follow up 06/05/22 and he denied further SOB but reported exertional fatigue and lightheadedness / pre-syncope. Given history of bradycardia and pauses on prior heart monitor, I opted to repeat a heart monitor which showed sinus pauses lasting up to 4 second during the sleeping hours, and 2.3% PVCs. Carotid artery ultrasound showed only minimal plaque. He was seen by Dr. Antoine Poche 07/18/22 and was not having symptomatic bradycardia. Amlodipine 2.5 mg was added to his HTN regimen.   I saw him for scheduled follow up 10/16/22. He denied CP and SOB. StopBang 7, he did not want to come to the lab for a sleep study and we planned home sleep study. I increased his amlodipine for better BP control, Dr. Antoine Poche further increased this to 10 mg for BP control in Aug 2024. Unfortunately, he was unhoused at that visit but reported taking 4 medications.   He presents today for routine follow up.  BP looks controlled, no chest pain or shortness of breath. He still walks without limitations. No complaints today. No medication changes.   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    Hyperlipidemia with target LDL less than 70    Hypertension    Sinus pause    3.9-4.5 sec documented, asymptomatic   Tobacco abuse     Past Surgical History:  Procedure Laterality Date   APPENDECTOMY     CORONARY STENT INTERVENTION N/A 12/19/2021   Procedure: CORONARY STENT INTERVENTION;  Surgeon: Tonny Bollman, MD;  Location: [facility] INVASIVE CV LAB;  Service: Cardiovascular;  Laterality: N/A;   DENTAL SURGERY     Removed right eye     RIGHT/LEFT HEART CATH AND CORONARY ANGIOGRAPHY N/A 12/19/2021   Procedure: RIGHT/LEFT HEART CATH AND CORONARY ANGIOGRAPHY;  Surgeon: Tonny Bollman, MD;  Location: [facility] INVASIVE CV LAB;  Service: Cardiovascular;  Laterality: N/A;    Current Medications: Current Meds  Medication Sig   amLODipine (NORVASC) 10 MG tablet Take 1 tablet (10 mg total) by mouth daily.   aspirin EC 81 MG tablet Take 1 tablet (81 mg total) by mouth daily.   atorvastatin (LIPITOR) 80 MG tablet Take 1 tablet (80 mg total) by mouth daily.   Blood Pressure Monitoring (BLOOD PRESSURE CUFF) MISC Take Blood Pressure Daily   brimonidine (ALPHAGAN) 0.15 % ophthalmic solution Place 1 drop into the left eye daily.   clopidogrel (PLAVIX) 75 MG tablet Take 1 tablet (75 mg total) by mouth daily.   dorzolamide-timolol (COSOPT) 22.3-6.8 MG/ML ophthalmic solution Place 1 drop into the left eye daily.   hydrochlorothiazide (HYDRODIURIL) 25 MG tablet Take 1 tablet (25 mg total) by mouth daily.   nitroGLYCERIN (NITROSTAT) 0.4 MG SL tablet Place 1 tablet (0.4 mg total) under the tongue every 5 (five) minutes as needed for chest pain.   ROCKLATAN 0.02-0.005 % SOLN Place 1 drop into the left eye at bedtime.     Allergies:   Patient has no known allergies.   Social History   Socioeconomic History   Marital status: Married    Spouse name: Not on file   Number of children: Not on file   Years of education: Not on file   Highest education level: Not on file  Occupational History   Not on file   Tobacco Use   Smoking status: Former    Current packs/day: 0.00    Types: Cigarettes    Quit date: 12/24/2021    Years since quitting: 1.3   Smokeless tobacco: Never   Tobacco comments:    occassionally   Substance and Sexual Activity   Alcohol use: Yes    Comment: socially   Drug use: Yes    Types: Marijuana    Comment: Marijuana about 1 week ago.   Sexual activity: Not on file  Other Topics Concern   Not on file  Social History Narrative   Not on file   Social Determinants of Health   Financial Resource Strain: Medium Risk (12/31/2022)   Overall Financial Resource Strain (CARDIA)    Difficulty of Paying Living Expenses: Somewhat hard  Food Insecurity: Food Insecurity Present (12/31/2022)   Hunger Vital Sign  Worried About Programme researcher, broadcasting/film/video in the Last Year: Often true    Ran Out of Food in the Last Year: Often true  Transportation Needs: No Transportation Needs (12/31/2022)   PRAPARE - Administrator, Civil Service (Medical): No    Lack of Transportation (Non-Medical): No  Physical Activity: Not on file  Stress: Not on file  Social Connections: Not on file     Family History: The patient's family history includes Diabetes in his brother; Heart disease in his mother; Kidney disease in his mother. There is no history of Colon cancer, Esophageal cancer, Rectal cancer, Stomach cancer, Prostate cancer, or Pancreatic cancer.  ROS:   Please see the history of present illness.     All other systems reviewed and are negative.  EKGs/Labs/Other Studies Reviewed:    The following studies were reviewed today:  Cardiac Studies & Procedures   CARDIAC CATHETERIZATION  CARDIAC CATHETERIZATION 12/19/2021  Narrative 1.  Severe proximal LAD stenosis, treated with PCI using a 3.5 x 24 mm Synergy DES 2.  Mild diffuse plaquing in the RCA, PDA, left circumflex, and intermediate branches with severe stenosis in a small posterolateral branch of the RCA 3.  Essentially  normal right heart pressures and left heart filling pressures  Recommendations: Aggressive medical therapy, overnight observation with plans for discharge tomorrow as long as no early complications arise.  Favor dual antiplatelet therapy with aspirin and ticagrelor for 12 months.  Even though this patient is done as an outpatient, his angiographic features suggest plaque rupture and I would treat him for a longer duration if tolerated.  Findings Coronary Findings Diagnostic  Dominance: Right  Left Anterior Descending Collaterals Dist LAD filled by collaterals from RPDA.  Prox LAD to Mid LAD lesion is 99% stenosed.  Ramus Intermedius There is a twin ramus branch with the more superior branch being the larger of the 2 vessels.  The larger branch has no disease.  The smaller branch has mild diffuse plaquing.  Left Circumflex There is mild diffuse disease throughout the vessel.  First Obtuse Marginal Branch The first OM is the largest vessel in the circumflex territory.  It has no significant stenosis.  The AV circumflex has mild diffuse disease and supplies a tiny posterolateral branch  Right Coronary Artery There is mild diffuse disease throughout the vessel. The RCA is dominant.  The vessel is ectatic and has mild diffuse plaquing with no significant stenosis.  The PDA branch is patent with no stenosis.  The first posterolateral branch is small and has severe 75% proximal vessel stenosis suitable for medical therapy.  First Right Posterolateral Branch 1st RPL lesion is 75% stenosed.  Intervention  Prox LAD to Mid LAD lesion Stent CATHETER LAUNCHER 65F EBU3.0 guide catheter was inserted. Lesion crossed with guidewire using a WIRE COUGAR XT STRL 190CM. Pre-stent angioplasty was performed using a BALLN SAPPHIRE 2.5X15. A drug-eluting stent was successfully placed using a SYNERGY XD 3.50X24. Maximum pressure: 12 atm. Stent strut is well apposed. Post-stent angioplasty was performed using a  BALL SAPPHIRE NC24 3.75X10. Maximum pressure:  20 atm. Post-Intervention Lesion Assessment The intervention was successful. Pre-interventional TIMI flow is 2. Post-intervention TIMI flow is 3. No complications occurred at this lesion. There is a 0% residual stenosis post intervention.   STRESS TESTS  EXERCISE TOLERANCE TEST (ETT) 01/26/2019  Narrative  There was no ST segment deviation noted during stress.  No T wave inversion was noted during stress.  No ischemic response Frequent PVC;s but  no NSVT No chronotropic incompetence with HR increasing From 78 to 130 bpm which was 89% PMHR   ECHOCARDIOGRAM  ECHOCARDIOGRAM COMPLETE 01/24/2019  Narrative ECHOCARDIOGRAM REPORT    Patient Name:   KOHLMAN BIANCA Date of Exam: 01/24/2019 Medical Rec #:  [id number]         Height:       64.0 in Accession #:    [id number]        Weight:       196.0 lb Date of Birth:  [birth year]         BSA:          1.94 m Patient Age:    77 years          BP:           122/61 mmHg Patient Gender: M                 HR:           51 bpm. Exam Location:  Inpatient   Procedure: 2D Echo  Indications:    Palpitations 785.1 / R00.2  History:        Patient has prior history of Echocardiogram examinations, most recent 03/08/2015. Sinus bradycardia Risk Factors: Hypertension.  Sonographer:    Tobin Chad Referring Phys: [id number] Marquita Palms VINCENT  IMPRESSIONS   1. The left ventricle has normal systolic function with an ejection fraction of 60-65%. The cavity size was normal. Left ventricular diastolic function could not be evaluated secondary to atrial fibrillation. 2. The right ventricle has normal systolic function. The cavity was normal. There is no increase in right ventricular wall thickness. Right ventricular systolic pressure could not be assessed. 3. Mild calcification of the anterior mitral valve leaflet. 4. The aortic valve is tricuspid. Moderate calcification of the aortic valve. Aortic  valve regurgitation was not assessed by color flow Doppler. Mild stenosis of the aortic valve. 5. The aorta is normal unless otherwise noted. 6. The inferior vena cava was dilated in size with >50% respiratory variability.  FINDINGS Left Ventricle: The left ventricle has normal systolic function, with an ejection fraction of 60-65%. The cavity size was normal. There is no increase in left ventricular wall thickness. Left ventricular diastolic function could not be evaluated secondary to atrial fibrillation.  Right Ventricle: The right ventricle has normal systolic function. The cavity was normal. There is no increase in right ventricular wall thickness. Right ventricular systolic pressure could not be assessed.  Left Atrium: Left atrial size was normal in size.  Right Atrium: Right atrial size was normal in size. Right atrial pressure is estimated at 8 mmHg.  Interatrial Septum: No atrial level shunt detected by color flow Doppler.  Pericardium: There is no evidence of pericardial effusion.  Mitral Valve: The mitral valve is normal in structure. Mild calcification of the anterior mitral valve leaflet. Mitral valve regurgitation is trivial by color flow Doppler.  Tricuspid Valve: The tricuspid valve is normal in structure. Tricuspid valve regurgitation is trivial by color flow Doppler.  Aortic Valve: The aortic valve is tricuspid Moderate calcification of the aortic valve. Aortic valve regurgitation was not assessed by color flow Doppler. There is Mild stenosis of the aortic valve, with a calculated valve area of 1.32 cm.  Pulmonic Valve: The pulmonic valve was normal in structure. Pulmonic valve regurgitation is not visualized by color flow Doppler.  Aorta: The aorta is normal unless otherwise noted.  Venous: The inferior vena cava measures 2.30 cm, is dilated in  size with greater than 50% respiratory variability.   +--------------+--------++ LEFT VENTRICLE          +-------------+----------++ +--------------+--------++ Diastology              PLAX 2D                +-------------+----------++ +--------------+--------++ LV e' medial:11.20 cm/s LVIDd:        4.20 cm  +-------------+----------++ +--------------+--------++ LVIDs:        2.80 cm  +--------------+--------++ LV PW:        0.90 cm  +--------------+--------++ LV IVS:       0.90 cm  +--------------+--------++ LVOT diam:    1.70 cm  +--------------+--------++ LV SV:        49 ml    +--------------+--------++ LV SV Index:  24.03    +--------------+--------++ LVOT Area:    2.27 cm +--------------+--------++                        +--------------+--------++  +---------------+----------++ RIGHT VENTRICLE           +---------------+----------++ RV S prime:    13.10 cm/s +---------------+----------++ TAPSE (M-mode):1.6 cm     +---------------+----------++  +---------------+-------++-----------++ LEFT ATRIUM           Index       +---------------+-------++-----------++ LA diam:       4.00 cm2.06 cm/m  +---------------+-------++-----------++ LA Vol (A2C):  70.2 ml36.19 ml/m +---------------+-------++-----------++ LA Vol (A4C):  56.5 ml29.13 ml/m +---------------+-------++-----------++ LA Biplane Vol:64.8 ml33.41 ml/m +---------------+-------++-----------++ +------------+---------++-----------++ RIGHT ATRIUM         Index       +------------+---------++-----------++ RA Pressure:8.00 mmHg            +------------+---------++-----------++ RA Area:    19.70 cm            +------------+---------++-----------++ RA Volume:  60.00 ml 30.93 ml/m +------------+---------++-----------++ +------------------+------------++ AORTIC VALVE                   +------------------+------------++ AV Area (Vmax):   1.48 cm     +------------------+------------++ AV Area  (Vmean):  1.35 cm     +------------------+------------++ AV Area (VTI):    1.32 cm     +------------------+------------++ AV Vmax:          223.00 cm/s  +------------------+------------++ AV Vmean:         162.000 cm/s +------------------+------------++ AV VTI:           0.414 m      +------------------+------------++ AV Peak Grad:     19.9 mmHg    +------------------+------------++ AV Mean Grad:     12.0 mmHg    +------------------+------------++ LVOT Vmax:        145.00 cm/s  +------------------+------------++ LVOT Vmean:       96.700 cm/s  +------------------+------------++ LVOT VTI:         0.241 m      +------------------+------------++ LVOT/AV VTI ratio:0.58         +------------------+------------++  +-------------+-------++ AORTA                +-------------+-------++ Ao Root diam:2.40 cm +-------------+-------++  +---------------+---------++ TRICUSPID VALVE          +---------------+---------++ Estimated RAP: 8.00 mmHg +---------------+---------++  +--------------+-------+ SHUNTS                +--------------+-------+ Systemic VTI: 0.24 m  +--------------+-------+ Systemic Diam:1.70 cm +--------------+-------+  +---------+-------+ IVC              +---------+-------+  IVC diam:2.30 cm +---------+-------+   Armanda Magic MD Electronically signed by Armanda Magic MD Signature Date/Time: 01/24/2019/1:50:46 PM    Final    MONITORS  LONG TERM MONITOR (3-14 DAYS) 07/16/2022  Narrative Predominant rhythm is normal sinus rhythm SVT with the longest being 7 beats. There are sinus pauses lasting up to 4 seconds.  These seem to happen during the sleeping hours There was brief junctional rhythm Occasional isolated PVCs 2.3% of beats Rare ventricular couplets.and ventricular bigeminy                 Recent Labs: 07/18/2022: BUN 16; Creatinine, Ser 1.08; Potassium 5.1; Sodium  142  Recent Lipid Panel    Component Value Date/Time   CHOL 127 12/20/2021 0409   CHOL 189 01/27/2020 1623   TRIG 101 12/20/2021 0409   HDL 40 (L) 12/20/2021 0409   HDL 50 01/27/2020 1623   CHOLHDL 3.2 12/20/2021 0409   VLDL 20 12/20/2021 0409   LDLCALC 67 12/20/2021 0409   LDLCALC 123 (H) 01/27/2020 1623     Risk Assessment/Calculations:           STOP-Bang Score:  7       Physical Exam:    VS:  BP (!) 130/54 (BP Location: Left Arm, Patient Position: Sitting, Cuff Size: Normal)   Pulse 77   Ht 5\' 4"  (1.626 m)   Wt 202 lb (91.6 kg)   BMI 34.67 kg/m     Wt Readings from Last 3 Encounters:  04/27/23 202 lb (91.6 kg)  12/26/22 204 lb (92.5 kg)  10/16/22 205 lb 12.8 oz (93.4 kg)     GEN:  Well nourished, well developed in no acute distress HEENT: Normal NECK: No JVD; No carotid bruits LYMPHATICS: No lymphadenopathy CARDIAC: RRR, no murmurs, rubs, gallops RESPIRATORY:  Clear to auscultation without rales, wheezing or rhonchi  ABDOMEN: Soft, non-tender, non-distended MUSCULOSKELETAL:  No edema; No deformity  SKIN: Warm and dry NEUROLOGIC:  Alert and oriented x 3 PSYCHIATRIC:  Normal affect   ASSESSMENT:    1. CAD S/P percutaneous coronary angioplasty   2. Primary hypertension   3. Hyperlipidemia with target LDL less than 70   4. Bradycardia    PLAN:    In order of problems listed above:  Shortness of breath - resolved Brilinta switched to Plavix PFTs were mildly abnormal. Doing better on plavix, hold off on pulmonology referral SOB improved   CAD s/p DES-LAD 12/2021 Longterm DAPT recommended for plaque rupture ASA and plavix No BB given bradycardia and sinus pauses No angina, continue risk factor modification   Hyperlipidemia with LDL goal < 70 12/20/2021: Cholesterol 127; HDL 40; LDL Cholesterol 67; Triglycerides 101; VLDL 20 Continue high intensity statin   Hypertension 25 mg HCTZ, 10 mg amlodipine   Bradycardia with sinus  pauses Asymptomatic, reassuring recent heart monitor No BB  Has seen EP, no plans for PPM at this time   Follow up in 6 months.          Medication Adjustments/Labs and Tests Ordered: Current medicines are reviewed at length with the patient today.  Concerns regarding medicines are outlined above.  No orders of the defined types were placed in this encounter.  No orders of the defined types were placed in this encounter.   There are no Patient Instructions on file for this visit.   Signed, Marcelino Duster, PA  04/27/2023 3:14 PM    [facility]

## 2023-04-27 ENCOUNTER — Encounter: Payer: Self-pay | Admitting: Physician Assistant

## 2023-04-27 ENCOUNTER — Ambulatory Visit: Payer: 59 | Attending: Physician Assistant | Admitting: Physician Assistant

## 2023-04-27 VITALS — BP 130/54 | HR 77 | Ht 64.0 in | Wt 202.0 lb

## 2023-04-27 DIAGNOSIS — I10 Essential (primary) hypertension: Secondary | ICD-10-CM

## 2023-04-27 DIAGNOSIS — I2510 Atherosclerotic heart disease of native coronary artery without angina pectoris: Secondary | ICD-10-CM

## 2023-04-27 DIAGNOSIS — R001 Bradycardia, unspecified: Secondary | ICD-10-CM | POA: Diagnosis not present

## 2023-04-27 DIAGNOSIS — E785 Hyperlipidemia, unspecified: Secondary | ICD-10-CM

## 2023-04-27 DIAGNOSIS — Z9861 Coronary angioplasty status: Secondary | ICD-10-CM | POA: Diagnosis not present

## 2023-04-27 NOTE — Patient Instructions (Signed)
Medication Instructions:  Continue same medications *If you need a refill on your cardiac medications before your next appointment, please call your pharmacy*   Lab Work: None ordered   Testing/Procedures: None ordered   Follow-Up: At [facility], you and your health needs are our priority.  As part of our continuing mission to provide you with exceptional heart care, we have created designated Provider Care Teams.  These Care Teams include your primary Cardiologist (physician) and Advanced Practice Providers (APPs -  Physician Assistants and Nurse Practitioners) who all work together to provide you with the care you need, when you need it.  We recommend signing up for the patient portal called "MyChart".  Sign up information is provided on this After Visit Summary.  MyChart is used to connect with patients for Virtual Visits (Telemedicine).  Patients are able to view lab/test results, encounter notes, upcoming appointments, etc.  Non-urgent messages can be sent to your provider as well.   To learn more about what you can do with MyChart, go to [email or web address].    Your next appointment:  6 months    Call in March to schedule June appointment     Provider:  Dr.Hochrein

## 2023-05-06 ENCOUNTER — Other Ambulatory Visit: Payer: Self-pay | Admitting: Cardiology

## 2023-07-07 ENCOUNTER — Telehealth: Payer: Self-pay

## 2023-07-07 NOTE — Telephone Encounter (Signed)
Ordering provider: A. Duke Associated diagnoses: Snoring, Daytime Somnolence WatchPAT PA obtained on 07/07/2023 by Brunetta Genera, LPN. Authorization: Yes; tracking ID [id number] Patient notified of PIN (1234) on 07/07/2023 via Notification Method: phone.

## 2023-08-04 NOTE — Telephone Encounter (Signed)
 Spoke with pt. Pt doesn't want to complete the sleep study. Pt states the box isn't opened and he will return it. When asked what day will pt return WatchPAT, pt said he didn't know. Pt was aware that there is a fee for any Watch PAT study not returned. Pt asked to please return this as soon as he can. Pt voiced understanding.

## 2023-10-30 ENCOUNTER — Other Ambulatory Visit: Payer: Self-pay

## 2023-10-30 DIAGNOSIS — R42 Dizziness and giddiness: Secondary | ICD-10-CM

## 2023-10-30 DIAGNOSIS — I2510 Atherosclerotic heart disease of native coronary artery without angina pectoris: Secondary | ICD-10-CM

## 2023-10-30 DIAGNOSIS — E785 Hyperlipidemia, unspecified: Secondary | ICD-10-CM

## 2023-10-30 DIAGNOSIS — I25118 Atherosclerotic heart disease of native coronary artery with other forms of angina pectoris: Secondary | ICD-10-CM

## 2023-10-30 MED ORDER — ATORVASTATIN CALCIUM 80 MG PO TABS
80.0000 mg | ORAL_TABLET | Freq: Every day | ORAL | 1 refills | Status: AC
Start: 2023-10-30 — End: ?

## 2023-11-04 NOTE — Progress Notes (Signed)
 Cardiology Office Note:   Date:  11/05/2023  ID:  Todd Ferguson, DOB 30-Dec-1940, MRN [id number] PCP: Edda Goo, MD  [facility] Providers Cardiologist:  Eilleen Grates, MD Cardiology APP:  Clearnce Curia, NP Howie Mackie Warren Haber, Todd Ferguson  Electrophysiologist:  Richardo Chandler, MD {  History of Present Illness:   Todd Ferguson is a 83 y.o. 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    Since I last saw him he does well.  He lives in a motel with his wife.  He does walking around his motel and goes grocery shopping.  He has noted the arm discomfort that was his previous angina.  He has had bradycardia with this noted at night.  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  He has not wanted to wear a mask or repeat study.  We try to get him to do an in-home study before but he did not want to do this.  He says he feels well.  He is not having any presyncope or syncope.  He denies any chest pressure, neck or arm discomfort.  He has no shortness of breath, PND or orthopnea.    ROS: As stated in the HPI and negative for all other systems.  Studies Reviewed:    EKG:   EKG Interpretation Date/Time:  Thursday November 05 2023 08:45:33 EDT Ventricular Rate:  52 PR Interval:    QRS Duration:  84 QT Interval:  428 QTC Calculation: 398 R Axis:   30  Text Interpretation: Normal sinus rhythm Sinus arrhythmia AV block with junctional escape. When compared with ECG of 26-Dec-2022 16:38, Rhythm is no longer sinus completely Confirmed by Eilleen Grates ([id number]) on 11/05/2023 9:03:05 AM     Risk Assessment/Calculations:         Physical Exam:   VS:  BP (!) 132/54   Pulse (!) 52   Ht 5' 4 (1.626 m)   Wt 203 lb (92.1 kg)   SpO2 96%   BMI 34.84 kg/m    Wt Readings from Last 3 Encounters:  11/05/23 203 lb (92.1 kg)  04/27/23 202 lb (91.6 kg)  12/26/22 204 lb (92.5 kg)     GEN: Well nourished, well developed in no  acute distress NECK: No JVD; No carotid bruits CARDIAC: RRR, 2 out of 6 brief apical systolic murmur radiating slightly at the right outflow tract, no diastolic murmurs, rubs, gallops RESPIRATORY:  Clear to auscultation without rales, wheezing or rhonchi  ABDOMEN: Soft, non-tender, non-distended EXTREMITIES:  No edema; No deformity   ASSESSMENT AND PLAN:   CAD s/p DES: He has no anginal symptoms.  He will continue with risk reduction.  He can stop his aspirin .    Hyperlipidemia :  LDL goal is was at goal previously but I would like for him to get a lipid profile today even though he is not fasting.  I would say goal LDL is in the 70s.  Hypertension: The blood pressure is at target.  No change in therapy.    Bradycardia with sinus pauses: He did have sinus pauses on the previous monitor but all of these were during sleeping hours.  He has absolutely no symptoms.  He has a good junctional escape when he does have some A-V dissociation.  I do not see an indication for pacing but he will let me know if he ever has any presyncope  or syncope.   Snoring:  He was to have a sleep study.  He does not repeat this.  I did review the 2020 and it was only mild.  He does not think he would wear CPAP.      Follow up with Angie Duke PAc in one year.   Signed, Eilleen Grates, MD

## 2023-11-05 ENCOUNTER — Encounter: Payer: Self-pay | Admitting: Cardiology

## 2023-11-05 ENCOUNTER — Telehealth: Payer: Self-pay | Admitting: Cardiology

## 2023-11-05 ENCOUNTER — Ambulatory Visit: Attending: Cardiology | Admitting: Cardiology

## 2023-11-05 VITALS — BP 132/54 | HR 52 | Ht 64.0 in | Wt 203.0 lb

## 2023-11-05 DIAGNOSIS — I10 Essential (primary) hypertension: Secondary | ICD-10-CM | POA: Diagnosis not present

## 2023-11-05 DIAGNOSIS — I25118 Atherosclerotic heart disease of native coronary artery with other forms of angina pectoris: Secondary | ICD-10-CM | POA: Diagnosis not present

## 2023-11-05 DIAGNOSIS — E785 Hyperlipidemia, unspecified: Secondary | ICD-10-CM

## 2023-11-05 DIAGNOSIS — R0683 Snoring: Secondary | ICD-10-CM | POA: Diagnosis not present

## 2023-11-05 NOTE — Telephone Encounter (Signed)
 Returned call. Phone keep ringing with no answer and no voicemail.

## 2023-11-05 NOTE — Patient Instructions (Signed)
 Medication Instructions:  Stop Aspirin  *If you need a refill on your cardiac medications before your next appointment, please call your pharmacy*  Lab Work: NONE If you have labs (blood work) drawn today and your tests are completely normal, you will receive your results only by: MyChart Message (if you have MyChart) OR A paper copy in the mail If you have any lab test that is abnormal or we need to change your treatment, we will call you to review the results.  Testing/Procedures: NONE  Follow-Up: At [facility], you and your health needs are our priority.  As part of our continuing mission to provide you with exceptional heart care, our providers are all part of one team.  This team includes your primary Cardiologist (physician) and Advanced Practice Providers or APPs (Physician Assistants and Nurse Practitioners) who all work together to provide you with the care you need, when you need it.  Your next appointment:   1 year  Provider:   Marcie Sever, PA   We recommend signing up for the patient portal called MyChart.  Sign up information is provided on this After Visit Summary.  MyChart is used to connect with patients for Virtual Visits (Telemedicine).  Patients are able to view lab/test results, encounter notes, upcoming appointments, etc.  Non-urgent messages can be sent to your provider as well.   To learn more about what you can do with MyChart, go to [email or web address].

## 2023-11-05 NOTE — Telephone Encounter (Signed)
 Pt's wife is requesting a callback regarding her wanting to know if pt's appt went okay this morning. She was unable to come with him to his appt but his phone is not working right now and he caught the bus to the appt so she just wants to make sure everything is ok. Please advise

## 2023-11-09 NOTE — Telephone Encounter (Signed)
 Spoke with pt's wife (per DPR) regarding his appointment. Wife just wanted to make sure he got here ok and that the appointment went well. Wife was told he made it to the appointment and it went well. Wife verbalized understanding. All questions if any were answered.

## 2023-12-15 ENCOUNTER — Other Ambulatory Visit: Payer: Self-pay

## 2023-12-15 MED ORDER — AMLODIPINE BESYLATE 10 MG PO TABS: 10.0000 mg | ORAL_TABLET | Freq: Every day | ORAL | 3 refills | Status: AC
